# Patient Record
Sex: Male | Born: 1965 | Race: Black or African American | Hispanic: No
Health system: Western US, Academic
[De-identification: ages and names within clinical notes are randomized; demographics above are authoritative.]

## PROBLEM LIST (undated history)

## (undated) DIAGNOSIS — J309 Allergic rhinitis, unspecified: Secondary | ICD-10-CM

## (undated) DIAGNOSIS — F88 Other disorders of psychological development: Secondary | ICD-10-CM

## (undated) DIAGNOSIS — E119 Type 2 diabetes mellitus without complications: Secondary | ICD-10-CM

## (undated) DIAGNOSIS — J4489 Other specified chronic obstructive pulmonary disease: Secondary | ICD-10-CM

## (undated) DIAGNOSIS — I10 Essential (primary) hypertension: Secondary | ICD-10-CM

## (undated) DIAGNOSIS — E669 Obesity, unspecified: Secondary | ICD-10-CM

## (undated) DIAGNOSIS — E756 Lipid storage disorder, unspecified: Secondary | ICD-10-CM

## (undated) DIAGNOSIS — J449 Chronic obstructive pulmonary disease, unspecified: Secondary | ICD-10-CM

## (undated) DIAGNOSIS — F2089 Other schizophrenia: Secondary | ICD-10-CM

## (undated) HISTORY — DX: Other specified chronic obstructive pulmonary disease: J44.89

## (undated) HISTORY — DX: Obesity, unspecified: E66.9

## (undated) HISTORY — DX: Chronic obstructive pulmonary disease, unspecified: J44.9

## (undated) HISTORY — DX: Allergic rhinitis, unspecified: J30.9

## (undated) HISTORY — DX: Lipid storage disorder, unspecified: E75.6

## (undated) HISTORY — DX: Other disorders of psychological development: F88

## (undated) HISTORY — DX: Type 2 diabetes mellitus without complications: E11.9

## (undated) HISTORY — DX: Essential (primary) hypertension: I10

## (undated) HISTORY — DX: Other schizophrenia: F20.89

## (undated) MED ORDER — SIMVASTATIN 10 MG OR TABS
ORAL_TABLET | ORAL | Status: AC
Start: 2013-12-30 — End: ?

## (undated) MED ORDER — VITAMIN D 25 MCG (1000 UT) OR TABS
ORAL_TABLET | ORAL | Status: AC
Start: 2019-01-04 — End: ?

## (undated) MED ORDER — VITAMIN D 25 MCG (1000 UT) OR TABS
ORAL_TABLET | ORAL | Status: AC
Start: 2019-01-06 — End: ?

## (undated) MED ORDER — AMLODIPINE BESYLATE 10 MG OR TABS
ORAL_TABLET | ORAL | Status: AC
Start: 2014-01-27 — End: ?

## (undated) MED ORDER — POTASSIUM CHLORIDE CRYS ER 10 MEQ OR TBCR
EXTENDED_RELEASE_TABLET | ORAL | Status: AC
Start: 2019-01-04 — End: ?

## (undated) MED ORDER — SIMVASTATIN 10 MG OR TABS
ORAL_TABLET | ORAL | Status: AC
Start: 2014-01-03 — End: ?

## (undated) MED ORDER — RANITIDINE HCL 150 MG OR TABS
ORAL_TABLET | ORAL | 5 refills | Status: AC
Start: 2018-06-19 — End: ?

---

## 1999-06-29 ENCOUNTER — Encounter (HOSPITAL_BASED_OUTPATIENT_CLINIC_OR_DEPARTMENT_OTHER): Payer: Self-pay | Admitting: Critical Care Medicine

## 1999-07-27 ENCOUNTER — Encounter (HOSPITAL_BASED_OUTPATIENT_CLINIC_OR_DEPARTMENT_OTHER): Payer: Self-pay | Admitting: Critical Care Medicine

## 1999-08-31 ENCOUNTER — Encounter (HOSPITAL_BASED_OUTPATIENT_CLINIC_OR_DEPARTMENT_OTHER): Payer: Self-pay | Admitting: Critical Care Medicine

## 1999-09-14 ENCOUNTER — Encounter (HOSPITAL_BASED_OUTPATIENT_CLINIC_OR_DEPARTMENT_OTHER): Payer: Self-pay | Admitting: Critical Care Medicine

## 1999-10-12 ENCOUNTER — Encounter (HOSPITAL_BASED_OUTPATIENT_CLINIC_OR_DEPARTMENT_OTHER): Payer: Self-pay | Admitting: Critical Care Medicine

## 1999-11-09 ENCOUNTER — Encounter (HOSPITAL_BASED_OUTPATIENT_CLINIC_OR_DEPARTMENT_OTHER): Payer: Self-pay | Admitting: Critical Care Medicine

## 1999-11-16 ENCOUNTER — Encounter (HOSPITAL_BASED_OUTPATIENT_CLINIC_OR_DEPARTMENT_OTHER): Payer: Self-pay | Admitting: Critical Care Medicine

## 1999-12-21 ENCOUNTER — Encounter (HOSPITAL_BASED_OUTPATIENT_CLINIC_OR_DEPARTMENT_OTHER): Payer: Self-pay | Admitting: Critical Care Medicine

## 2000-02-15 ENCOUNTER — Encounter (HOSPITAL_BASED_OUTPATIENT_CLINIC_OR_DEPARTMENT_OTHER): Payer: Self-pay | Admitting: Critical Care Medicine

## 2000-03-21 ENCOUNTER — Encounter (HOSPITAL_BASED_OUTPATIENT_CLINIC_OR_DEPARTMENT_OTHER): Payer: Self-pay | Admitting: Critical Care Medicine

## 2000-05-23 ENCOUNTER — Encounter (HOSPITAL_BASED_OUTPATIENT_CLINIC_OR_DEPARTMENT_OTHER): Payer: Self-pay | Admitting: Critical Care Medicine

## 2000-05-30 ENCOUNTER — Encounter (HOSPITAL_BASED_OUTPATIENT_CLINIC_OR_DEPARTMENT_OTHER): Payer: Self-pay | Admitting: Critical Care Medicine

## 2000-07-25 ENCOUNTER — Encounter (HOSPITAL_BASED_OUTPATIENT_CLINIC_OR_DEPARTMENT_OTHER): Payer: Self-pay | Admitting: Critical Care Medicine

## 2000-09-26 ENCOUNTER — Encounter (HOSPITAL_BASED_OUTPATIENT_CLINIC_OR_DEPARTMENT_OTHER): Payer: Self-pay | Admitting: Critical Care Medicine

## 2000-11-07 ENCOUNTER — Encounter (HOSPITAL_BASED_OUTPATIENT_CLINIC_OR_DEPARTMENT_OTHER): Payer: Medicare Other | Admitting: Critical Care Medicine

## 2001-01-09 ENCOUNTER — Encounter (HOSPITAL_BASED_OUTPATIENT_CLINIC_OR_DEPARTMENT_OTHER): Payer: Medicare Other | Admitting: Critical Care Medicine

## 2001-02-06 ENCOUNTER — Encounter (HOSPITAL_BASED_OUTPATIENT_CLINIC_OR_DEPARTMENT_OTHER): Payer: Self-pay | Admitting: Critical Care Medicine

## 2001-02-27 ENCOUNTER — Encounter (HOSPITAL_BASED_OUTPATIENT_CLINIC_OR_DEPARTMENT_OTHER): Payer: Self-pay | Admitting: Critical Care Medicine

## 2001-09-03 ENCOUNTER — Encounter (HOSPITAL_BASED_OUTPATIENT_CLINIC_OR_DEPARTMENT_OTHER): Payer: Self-pay | Admitting: Family Medicine

## 2001-10-16 ENCOUNTER — Encounter (HOSPITAL_BASED_OUTPATIENT_CLINIC_OR_DEPARTMENT_OTHER): Payer: Medicare Other | Admitting: Critical Care Medicine

## 2001-11-05 ENCOUNTER — Encounter (HOSPITAL_BASED_OUTPATIENT_CLINIC_OR_DEPARTMENT_OTHER): Payer: Self-pay | Admitting: Family Medicine

## 2001-11-12 ENCOUNTER — Encounter (HOSPITAL_BASED_OUTPATIENT_CLINIC_OR_DEPARTMENT_OTHER): Payer: Self-pay | Admitting: Family Medicine

## 2001-11-20 ENCOUNTER — Encounter (HOSPITAL_BASED_OUTPATIENT_CLINIC_OR_DEPARTMENT_OTHER): Payer: Self-pay | Admitting: Critical Care Medicine

## 2001-11-20 ENCOUNTER — Encounter (HOSPITAL_BASED_OUTPATIENT_CLINIC_OR_DEPARTMENT_OTHER): Payer: Medicare Other | Admitting: Critical Care Medicine

## 2001-12-22 ENCOUNTER — Encounter (HOSPITAL_BASED_OUTPATIENT_CLINIC_OR_DEPARTMENT_OTHER): Payer: Medicaid Other

## 2002-01-05 ENCOUNTER — Encounter (HOSPITAL_BASED_OUTPATIENT_CLINIC_OR_DEPARTMENT_OTHER): Payer: Medicaid Other

## 2002-01-08 ENCOUNTER — Encounter (HOSPITAL_BASED_OUTPATIENT_CLINIC_OR_DEPARTMENT_OTHER): Payer: Medicaid Other

## 2002-01-08 ENCOUNTER — Encounter (HOSPITAL_BASED_OUTPATIENT_CLINIC_OR_DEPARTMENT_OTHER): Payer: Medicaid Other | Admitting: Critical Care Medicine

## 2002-02-05 ENCOUNTER — Encounter (HOSPITAL_BASED_OUTPATIENT_CLINIC_OR_DEPARTMENT_OTHER): Payer: Medicaid Other | Admitting: Critical Care Medicine

## 2002-02-26 ENCOUNTER — Encounter (HOSPITAL_BASED_OUTPATIENT_CLINIC_OR_DEPARTMENT_OTHER): Payer: Self-pay | Admitting: Critical Care Medicine

## 2002-03-03 ENCOUNTER — Encounter (HOSPITAL_BASED_OUTPATIENT_CLINIC_OR_DEPARTMENT_OTHER): Payer: Medicaid Other | Admitting: Internal Medicine

## 2002-03-12 ENCOUNTER — Encounter (HOSPITAL_BASED_OUTPATIENT_CLINIC_OR_DEPARTMENT_OTHER): Payer: Medicaid Other | Admitting: Critical Care Medicine

## 2002-04-16 ENCOUNTER — Encounter (HOSPITAL_BASED_OUTPATIENT_CLINIC_OR_DEPARTMENT_OTHER): Payer: Self-pay | Admitting: Critical Care Medicine

## 2002-04-27 ENCOUNTER — Encounter (HOSPITAL_BASED_OUTPATIENT_CLINIC_OR_DEPARTMENT_OTHER): Payer: Medicaid Other

## 2002-05-04 ENCOUNTER — Encounter (HOSPITAL_BASED_OUTPATIENT_CLINIC_OR_DEPARTMENT_OTHER): Payer: Self-pay | Admitting: Critical Care Medicine

## 2002-05-21 ENCOUNTER — Encounter (HOSPITAL_BASED_OUTPATIENT_CLINIC_OR_DEPARTMENT_OTHER): Payer: Medicaid Other | Admitting: Critical Care Medicine

## 2002-06-03 ENCOUNTER — Encounter (HOSPITAL_BASED_OUTPATIENT_CLINIC_OR_DEPARTMENT_OTHER): Payer: Medicaid Other | Admitting: Internal Medicine

## 2002-06-18 ENCOUNTER — Encounter (HOSPITAL_BASED_OUTPATIENT_CLINIC_OR_DEPARTMENT_OTHER): Payer: Self-pay | Admitting: Critical Care Medicine

## 2002-06-25 ENCOUNTER — Encounter (HOSPITAL_BASED_OUTPATIENT_CLINIC_OR_DEPARTMENT_OTHER): Payer: Medicaid Other | Admitting: Critical Care Medicine

## 2002-07-30 ENCOUNTER — Encounter (HOSPITAL_BASED_OUTPATIENT_CLINIC_OR_DEPARTMENT_OTHER): Payer: Self-pay

## 2002-09-03 ENCOUNTER — Encounter (HOSPITAL_BASED_OUTPATIENT_CLINIC_OR_DEPARTMENT_OTHER): Payer: Self-pay | Admitting: Critical Care Medicine

## 2002-09-07 ENCOUNTER — Encounter (HOSPITAL_BASED_OUTPATIENT_CLINIC_OR_DEPARTMENT_OTHER): Payer: Medicaid Other

## 2002-09-14 ENCOUNTER — Encounter (HOSPITAL_BASED_OUTPATIENT_CLINIC_OR_DEPARTMENT_OTHER): Payer: Medicaid Other

## 2002-10-12 ENCOUNTER — Encounter (HOSPITAL_BASED_OUTPATIENT_CLINIC_OR_DEPARTMENT_OTHER): Payer: Self-pay

## 2002-11-16 ENCOUNTER — Encounter (HOSPITAL_BASED_OUTPATIENT_CLINIC_OR_DEPARTMENT_OTHER): Payer: Medicaid Other | Admitting: Internal Medicine

## 2002-11-25 ENCOUNTER — Encounter (HOSPITAL_BASED_OUTPATIENT_CLINIC_OR_DEPARTMENT_OTHER): Payer: Self-pay

## 2002-12-01 ENCOUNTER — Encounter (HOSPITAL_BASED_OUTPATIENT_CLINIC_OR_DEPARTMENT_OTHER): Payer: Medicaid Other

## 2003-03-09 ENCOUNTER — Encounter (HOSPITAL_BASED_OUTPATIENT_CLINIC_OR_DEPARTMENT_OTHER): Payer: Self-pay | Admitting: Internal Medicine

## 2004-02-10 ENCOUNTER — Encounter (HOSPITAL_BASED_OUTPATIENT_CLINIC_OR_DEPARTMENT_OTHER): Payer: Medicaid Other | Admitting: Critical Care Medicine

## 2004-03-05 ENCOUNTER — Encounter (HOSPITAL_BASED_OUTPATIENT_CLINIC_OR_DEPARTMENT_OTHER): Payer: Medicaid Other | Admitting: Internal Medicine

## 2004-03-23 ENCOUNTER — Encounter (HOSPITAL_BASED_OUTPATIENT_CLINIC_OR_DEPARTMENT_OTHER): Payer: Medicaid Other | Admitting: Critical Care Medicine

## 2004-04-27 ENCOUNTER — Encounter (HOSPITAL_BASED_OUTPATIENT_CLINIC_OR_DEPARTMENT_OTHER): Payer: Medicaid Other | Admitting: Critical Care Medicine

## 2004-05-11 ENCOUNTER — Encounter (HOSPITAL_BASED_OUTPATIENT_CLINIC_OR_DEPARTMENT_OTHER): Payer: Medicaid Other | Admitting: Critical Care Medicine

## 2004-05-22 ENCOUNTER — Encounter (HOSPITAL_BASED_OUTPATIENT_CLINIC_OR_DEPARTMENT_OTHER): Payer: Medicaid Other

## 2004-06-08 ENCOUNTER — Encounter (HOSPITAL_BASED_OUTPATIENT_CLINIC_OR_DEPARTMENT_OTHER): Payer: Medicaid Other | Admitting: Critical Care Medicine

## 2004-06-22 ENCOUNTER — Encounter (HOSPITAL_BASED_OUTPATIENT_CLINIC_OR_DEPARTMENT_OTHER): Payer: Medicaid Other

## 2004-07-06 ENCOUNTER — Encounter (HOSPITAL_BASED_OUTPATIENT_CLINIC_OR_DEPARTMENT_OTHER): Payer: Medicaid Other | Admitting: Critical Care Medicine

## 2004-07-13 ENCOUNTER — Encounter (HOSPITAL_BASED_OUTPATIENT_CLINIC_OR_DEPARTMENT_OTHER): Payer: Medicaid Other

## 2004-07-30 ENCOUNTER — Encounter (HOSPITAL_BASED_OUTPATIENT_CLINIC_OR_DEPARTMENT_OTHER): Payer: Medicaid Other | Admitting: Internal Medicine

## 2004-07-31 ENCOUNTER — Encounter (HOSPITAL_BASED_OUTPATIENT_CLINIC_OR_DEPARTMENT_OTHER): Payer: Medicaid Other

## 2004-08-10 ENCOUNTER — Encounter (HOSPITAL_BASED_OUTPATIENT_CLINIC_OR_DEPARTMENT_OTHER): Payer: Medicaid Other | Admitting: Critical Care Medicine

## 2004-08-21 ENCOUNTER — Encounter (HOSPITAL_BASED_OUTPATIENT_CLINIC_OR_DEPARTMENT_OTHER): Payer: Medicaid Other

## 2004-09-07 ENCOUNTER — Encounter (HOSPITAL_BASED_OUTPATIENT_CLINIC_OR_DEPARTMENT_OTHER): Payer: Medicaid Other | Admitting: Critical Care Medicine

## 2004-09-10 ENCOUNTER — Encounter (HOSPITAL_BASED_OUTPATIENT_CLINIC_OR_DEPARTMENT_OTHER): Payer: Medicaid Other | Admitting: Internal Medicine

## 2004-09-21 ENCOUNTER — Encounter (HOSPITAL_BASED_OUTPATIENT_CLINIC_OR_DEPARTMENT_OTHER): Payer: Medicaid Other

## 2004-10-01 ENCOUNTER — Encounter (HOSPITAL_BASED_OUTPATIENT_CLINIC_OR_DEPARTMENT_OTHER): Payer: Medicaid Other | Admitting: Internal Medicine

## 2004-10-05 ENCOUNTER — Encounter (HOSPITAL_BASED_OUTPATIENT_CLINIC_OR_DEPARTMENT_OTHER): Payer: Medicaid Other | Admitting: Critical Care Medicine

## 2004-10-12 ENCOUNTER — Encounter (HOSPITAL_BASED_OUTPATIENT_CLINIC_OR_DEPARTMENT_OTHER): Payer: Medicaid Other | Admitting: Critical Care Medicine

## 2004-10-19 ENCOUNTER — Encounter (HOSPITAL_BASED_OUTPATIENT_CLINIC_OR_DEPARTMENT_OTHER): Payer: Medicaid Other

## 2004-10-22 ENCOUNTER — Encounter (HOSPITAL_BASED_OUTPATIENT_CLINIC_OR_DEPARTMENT_OTHER): Payer: Medicaid Other | Admitting: Internal Medicine

## 2004-11-02 ENCOUNTER — Encounter (HOSPITAL_BASED_OUTPATIENT_CLINIC_OR_DEPARTMENT_OTHER): Payer: Self-pay | Admitting: Critical Care Medicine

## 2004-11-16 ENCOUNTER — Encounter (HOSPITAL_BASED_OUTPATIENT_CLINIC_OR_DEPARTMENT_OTHER): Payer: Medicaid Other | Admitting: Critical Care Medicine

## 2004-11-20 ENCOUNTER — Encounter (HOSPITAL_BASED_OUTPATIENT_CLINIC_OR_DEPARTMENT_OTHER): Payer: Medicaid Other | Admitting: Internal Medicine

## 2004-11-30 ENCOUNTER — Encounter (HOSPITAL_BASED_OUTPATIENT_CLINIC_OR_DEPARTMENT_OTHER): Payer: Medicaid Other | Admitting: Critical Care Medicine

## 2005-01-04 ENCOUNTER — Encounter (HOSPITAL_BASED_OUTPATIENT_CLINIC_OR_DEPARTMENT_OTHER): Payer: Medicaid Other | Admitting: Critical Care Medicine

## 2005-01-11 ENCOUNTER — Encounter (HOSPITAL_BASED_OUTPATIENT_CLINIC_OR_DEPARTMENT_OTHER): Payer: Medicaid Other

## 2005-01-30 ENCOUNTER — Encounter (HOSPITAL_BASED_OUTPATIENT_CLINIC_OR_DEPARTMENT_OTHER): Payer: Medicaid Other | Admitting: Internal Medicine

## 2005-03-08 ENCOUNTER — Encounter (HOSPITAL_BASED_OUTPATIENT_CLINIC_OR_DEPARTMENT_OTHER): Payer: Medicaid Other | Admitting: Critical Care Medicine

## 2005-03-08 ENCOUNTER — Encounter (HOSPITAL_BASED_OUTPATIENT_CLINIC_OR_DEPARTMENT_OTHER): Payer: Medicaid Other

## 2005-03-15 ENCOUNTER — Encounter (HOSPITAL_BASED_OUTPATIENT_CLINIC_OR_DEPARTMENT_OTHER): Payer: Medicaid Other | Admitting: Critical Care Medicine

## 2005-03-22 ENCOUNTER — Encounter (HOSPITAL_BASED_OUTPATIENT_CLINIC_OR_DEPARTMENT_OTHER): Payer: Medicaid Other | Admitting: Critical Care Medicine

## 2005-03-26 ENCOUNTER — Encounter (HOSPITAL_BASED_OUTPATIENT_CLINIC_OR_DEPARTMENT_OTHER): Payer: Medicaid Other | Admitting: Critical Care Medicine

## 2005-04-26 ENCOUNTER — Encounter (HOSPITAL_BASED_OUTPATIENT_CLINIC_OR_DEPARTMENT_OTHER): Payer: Medicaid Other | Admitting: Critical Care Medicine

## 2005-05-03 ENCOUNTER — Encounter (HOSPITAL_BASED_OUTPATIENT_CLINIC_OR_DEPARTMENT_OTHER): Payer: Medicaid Other | Admitting: Critical Care Medicine

## 2005-05-13 ENCOUNTER — Encounter (HOSPITAL_BASED_OUTPATIENT_CLINIC_OR_DEPARTMENT_OTHER): Payer: Medicaid Other | Admitting: Internal Medicine

## 2005-05-31 ENCOUNTER — Encounter (HOSPITAL_BASED_OUTPATIENT_CLINIC_OR_DEPARTMENT_OTHER): Payer: Medicaid Other

## 2005-06-07 ENCOUNTER — Encounter (HOSPITAL_BASED_OUTPATIENT_CLINIC_OR_DEPARTMENT_OTHER): Payer: Medicaid Other | Admitting: Critical Care Medicine

## 2005-07-02 ENCOUNTER — Encounter (HOSPITAL_BASED_OUTPATIENT_CLINIC_OR_DEPARTMENT_OTHER): Payer: Medicaid Other

## 2005-08-06 ENCOUNTER — Encounter (HOSPITAL_BASED_OUTPATIENT_CLINIC_OR_DEPARTMENT_OTHER): Payer: Medicaid Other | Admitting: Pediatric Dermatology

## 2005-08-08 ENCOUNTER — Encounter (HOSPITAL_BASED_OUTPATIENT_CLINIC_OR_DEPARTMENT_OTHER): Payer: Medicaid Other

## 2005-08-09 ENCOUNTER — Encounter (HOSPITAL_BASED_OUTPATIENT_CLINIC_OR_DEPARTMENT_OTHER): Payer: Medicaid Other | Admitting: Critical Care Medicine

## 2005-09-10 ENCOUNTER — Encounter (HOSPITAL_BASED_OUTPATIENT_CLINIC_OR_DEPARTMENT_OTHER): Payer: Medicaid Other

## 2005-09-27 ENCOUNTER — Encounter (HOSPITAL_BASED_OUTPATIENT_CLINIC_OR_DEPARTMENT_OTHER): Payer: Medicaid Other | Admitting: Internal Medicine

## 2005-09-27 ENCOUNTER — Encounter (HOSPITAL_BASED_OUTPATIENT_CLINIC_OR_DEPARTMENT_OTHER): Payer: Medicaid Other

## 2005-10-11 ENCOUNTER — Encounter (HOSPITAL_BASED_OUTPATIENT_CLINIC_OR_DEPARTMENT_OTHER): Payer: Medicaid Other | Admitting: Critical Care Medicine

## 2005-11-08 ENCOUNTER — Encounter (HOSPITAL_BASED_OUTPATIENT_CLINIC_OR_DEPARTMENT_OTHER): Payer: Medicaid Other

## 2005-12-06 ENCOUNTER — Encounter (HOSPITAL_BASED_OUTPATIENT_CLINIC_OR_DEPARTMENT_OTHER): Payer: Medicaid Other

## 2005-12-13 ENCOUNTER — Encounter (HOSPITAL_BASED_OUTPATIENT_CLINIC_OR_DEPARTMENT_OTHER): Payer: Medicaid Other | Admitting: Internal Medicine

## 2005-12-20 ENCOUNTER — Encounter (HOSPITAL_BASED_OUTPATIENT_CLINIC_OR_DEPARTMENT_OTHER): Payer: Medicaid Other | Admitting: Critical Care Medicine

## 2005-12-27 ENCOUNTER — Encounter (HOSPITAL_BASED_OUTPATIENT_CLINIC_OR_DEPARTMENT_OTHER): Payer: Medicaid Other | Admitting: Critical Care Medicine

## 2006-01-03 ENCOUNTER — Encounter (HOSPITAL_BASED_OUTPATIENT_CLINIC_OR_DEPARTMENT_OTHER): Payer: Medicaid Other

## 2006-01-29 ENCOUNTER — Encounter (HOSPITAL_BASED_OUTPATIENT_CLINIC_OR_DEPARTMENT_OTHER): Payer: Medicaid Other | Admitting: Internal Medicine

## 2006-02-11 ENCOUNTER — Encounter (HOSPITAL_BASED_OUTPATIENT_CLINIC_OR_DEPARTMENT_OTHER): Payer: Medicaid Other | Admitting: Critical Care Medicine

## 2006-03-04 ENCOUNTER — Encounter (HOSPITAL_BASED_OUTPATIENT_CLINIC_OR_DEPARTMENT_OTHER): Payer: Medicaid Other

## 2006-03-25 ENCOUNTER — Encounter (HOSPITAL_BASED_OUTPATIENT_CLINIC_OR_DEPARTMENT_OTHER): Payer: Medicaid Other | Admitting: Critical Care Medicine

## 2006-03-28 ENCOUNTER — Encounter (HOSPITAL_BASED_OUTPATIENT_CLINIC_OR_DEPARTMENT_OTHER): Payer: Medicaid Other

## 2006-04-29 ENCOUNTER — Encounter (HOSPITAL_BASED_OUTPATIENT_CLINIC_OR_DEPARTMENT_OTHER): Payer: Medicaid Other | Admitting: Critical Care Medicine

## 2006-05-16 ENCOUNTER — Encounter (HOSPITAL_BASED_OUTPATIENT_CLINIC_OR_DEPARTMENT_OTHER): Payer: Medicaid Other | Admitting: Internal Medicine

## 2006-05-30 ENCOUNTER — Encounter (HOSPITAL_BASED_OUTPATIENT_CLINIC_OR_DEPARTMENT_OTHER): Payer: Medicaid Other | Admitting: Internal Medicine

## 2006-07-28 ENCOUNTER — Encounter (HOSPITAL_BASED_OUTPATIENT_CLINIC_OR_DEPARTMENT_OTHER): Payer: Medicaid Other | Admitting: Internal Medicine

## 2006-11-20 ENCOUNTER — Encounter (HOSPITAL_BASED_OUTPATIENT_CLINIC_OR_DEPARTMENT_OTHER): Payer: Medicaid Other | Admitting: Internal Medicine

## 2006-11-21 ENCOUNTER — Encounter (HOSPITAL_BASED_OUTPATIENT_CLINIC_OR_DEPARTMENT_OTHER): Payer: Medicaid Other | Admitting: Internal Medicine

## 2006-12-11 ENCOUNTER — Encounter (HOSPITAL_BASED_OUTPATIENT_CLINIC_OR_DEPARTMENT_OTHER): Payer: Medicaid Other | Admitting: Internal Medicine

## 2007-01-22 ENCOUNTER — Encounter (HOSPITAL_BASED_OUTPATIENT_CLINIC_OR_DEPARTMENT_OTHER): Payer: Medicaid Other | Admitting: Internal Medicine

## 2007-06-15 ENCOUNTER — Encounter (HOSPITAL_BASED_OUTPATIENT_CLINIC_OR_DEPARTMENT_OTHER): Payer: Self-pay | Admitting: Internal Medicine

## 2007-08-11 ENCOUNTER — Encounter (HOSPITAL_BASED_OUTPATIENT_CLINIC_OR_DEPARTMENT_OTHER): Payer: Medicaid Other | Admitting: Nurse Practitioner

## 2007-08-21 ENCOUNTER — Encounter (HOSPITAL_BASED_OUTPATIENT_CLINIC_OR_DEPARTMENT_OTHER): Payer: Medicaid Other | Admitting: Critical Care Medicine

## 2007-09-01 ENCOUNTER — Encounter (HOSPITAL_BASED_OUTPATIENT_CLINIC_OR_DEPARTMENT_OTHER): Payer: Self-pay | Admitting: Critical Care Medicine

## 2007-09-08 ENCOUNTER — Encounter (HOSPITAL_BASED_OUTPATIENT_CLINIC_OR_DEPARTMENT_OTHER): Payer: Medicaid Other | Admitting: Pulmonary Disease

## 2007-09-10 ENCOUNTER — Encounter (HOSPITAL_BASED_OUTPATIENT_CLINIC_OR_DEPARTMENT_OTHER): Payer: Medicaid Other | Admitting: Internal Medicine

## 2007-10-06 ENCOUNTER — Encounter (HOSPITAL_BASED_OUTPATIENT_CLINIC_OR_DEPARTMENT_OTHER): Payer: Medicaid Other | Admitting: Pulmonary Disease

## 2007-10-26 ENCOUNTER — Encounter (HOSPITAL_BASED_OUTPATIENT_CLINIC_OR_DEPARTMENT_OTHER): Payer: Medicaid Other | Admitting: Internal Medicine

## 2007-10-27 ENCOUNTER — Encounter (HOSPITAL_BASED_OUTPATIENT_CLINIC_OR_DEPARTMENT_OTHER): Payer: Medicaid Other | Admitting: Internal Medicine

## 2007-11-03 ENCOUNTER — Encounter (HOSPITAL_BASED_OUTPATIENT_CLINIC_OR_DEPARTMENT_OTHER): Payer: Medicaid Other | Admitting: Internal Medicine

## 2007-11-12 ENCOUNTER — Encounter (HOSPITAL_BASED_OUTPATIENT_CLINIC_OR_DEPARTMENT_OTHER): Payer: Medicaid Other | Admitting: Internal Medicine

## 2007-12-15 ENCOUNTER — Encounter (HOSPITAL_BASED_OUTPATIENT_CLINIC_OR_DEPARTMENT_OTHER): Payer: Medicaid Other | Admitting: Internal Medicine

## 2007-12-28 ENCOUNTER — Encounter (HOSPITAL_BASED_OUTPATIENT_CLINIC_OR_DEPARTMENT_OTHER): Payer: Medicaid Other | Admitting: Nurse Practitioner

## 2008-02-04 ENCOUNTER — Encounter (HOSPITAL_BASED_OUTPATIENT_CLINIC_OR_DEPARTMENT_OTHER): Payer: Medicaid Other | Admitting: Internal Medicine

## 2008-02-09 ENCOUNTER — Encounter (HOSPITAL_BASED_OUTPATIENT_CLINIC_OR_DEPARTMENT_OTHER): Payer: Medicaid Other | Admitting: Internal Medicine

## 2008-02-12 ENCOUNTER — Encounter (HOSPITAL_BASED_OUTPATIENT_CLINIC_OR_DEPARTMENT_OTHER): Payer: Self-pay | Admitting: Internal Medicine

## 2008-02-16 ENCOUNTER — Encounter (HOSPITAL_BASED_OUTPATIENT_CLINIC_OR_DEPARTMENT_OTHER): Payer: Medicaid Other | Admitting: Internal Medicine

## 2008-02-23 ENCOUNTER — Encounter (HOSPITAL_BASED_OUTPATIENT_CLINIC_OR_DEPARTMENT_OTHER): Payer: Medicaid Other

## 2008-03-15 ENCOUNTER — Encounter (HOSPITAL_BASED_OUTPATIENT_CLINIC_OR_DEPARTMENT_OTHER): Payer: Self-pay | Admitting: Internal Medicine

## 2008-03-15 ENCOUNTER — Encounter (HOSPITAL_BASED_OUTPATIENT_CLINIC_OR_DEPARTMENT_OTHER): Payer: Medicaid Other | Admitting: Internal Medicine

## 2008-03-22 ENCOUNTER — Encounter (HOSPITAL_BASED_OUTPATIENT_CLINIC_OR_DEPARTMENT_OTHER): Payer: Medicaid Other | Admitting: Internal Medicine

## 2008-03-24 ENCOUNTER — Encounter (HOSPITAL_BASED_OUTPATIENT_CLINIC_OR_DEPARTMENT_OTHER): Payer: Medicaid Other | Admitting: Registered"

## 2008-03-29 ENCOUNTER — Encounter (HOSPITAL_BASED_OUTPATIENT_CLINIC_OR_DEPARTMENT_OTHER): Payer: Medicaid Other | Admitting: Registered"

## 2008-04-12 ENCOUNTER — Encounter (HOSPITAL_BASED_OUTPATIENT_CLINIC_OR_DEPARTMENT_OTHER): Payer: Medicaid Other | Admitting: Registered"

## 2008-04-26 ENCOUNTER — Encounter (HOSPITAL_BASED_OUTPATIENT_CLINIC_OR_DEPARTMENT_OTHER): Payer: Medicaid Other | Admitting: Internal Medicine

## 2008-05-17 ENCOUNTER — Encounter (HOSPITAL_BASED_OUTPATIENT_CLINIC_OR_DEPARTMENT_OTHER): Payer: Medicaid Other | Admitting: Internal Medicine

## 2008-05-17 ENCOUNTER — Encounter (HOSPITAL_BASED_OUTPATIENT_CLINIC_OR_DEPARTMENT_OTHER): Payer: Medicaid Other | Admitting: Registered"

## 2008-06-07 ENCOUNTER — Encounter (HOSPITAL_BASED_OUTPATIENT_CLINIC_OR_DEPARTMENT_OTHER): Payer: Medicaid Other | Admitting: Internal Medicine

## 2008-06-21 ENCOUNTER — Encounter (HOSPITAL_BASED_OUTPATIENT_CLINIC_OR_DEPARTMENT_OTHER): Payer: Medicaid Other | Admitting: Internal Medicine

## 2008-08-08 ENCOUNTER — Encounter (HOSPITAL_BASED_OUTPATIENT_CLINIC_OR_DEPARTMENT_OTHER): Payer: Medicaid Other | Admitting: Registered"

## 2008-08-08 ENCOUNTER — Encounter (HOSPITAL_BASED_OUTPATIENT_CLINIC_OR_DEPARTMENT_OTHER): Payer: Medicaid Other | Admitting: Internal Medicine

## 2008-08-09 ENCOUNTER — Encounter (HOSPITAL_BASED_OUTPATIENT_CLINIC_OR_DEPARTMENT_OTHER): Payer: Medicaid Other | Admitting: Internal Medicine

## 2008-08-12 ENCOUNTER — Encounter (HOSPITAL_BASED_OUTPATIENT_CLINIC_OR_DEPARTMENT_OTHER): Payer: Medicaid Other | Admitting: Registered"

## 2008-08-16 ENCOUNTER — Encounter (HOSPITAL_BASED_OUTPATIENT_CLINIC_OR_DEPARTMENT_OTHER): Payer: Self-pay | Admitting: Internal Medicine

## 2008-08-26 ENCOUNTER — Encounter (HOSPITAL_BASED_OUTPATIENT_CLINIC_OR_DEPARTMENT_OTHER): Payer: Medicaid Other | Admitting: Internal Medicine

## 2008-09-06 ENCOUNTER — Encounter (HOSPITAL_BASED_OUTPATIENT_CLINIC_OR_DEPARTMENT_OTHER): Payer: Medicaid Other | Admitting: Internal Medicine

## 2008-09-12 ENCOUNTER — Encounter (HOSPITAL_BASED_OUTPATIENT_CLINIC_OR_DEPARTMENT_OTHER): Payer: Medicaid Other | Admitting: Registered"

## 2008-09-20 ENCOUNTER — Encounter (HOSPITAL_BASED_OUTPATIENT_CLINIC_OR_DEPARTMENT_OTHER): Payer: Medicaid Other | Admitting: Internal Medicine

## 2008-10-03 ENCOUNTER — Encounter (HOSPITAL_BASED_OUTPATIENT_CLINIC_OR_DEPARTMENT_OTHER): Payer: Medicaid Other | Admitting: Registered"

## 2008-10-31 ENCOUNTER — Encounter (HOSPITAL_BASED_OUTPATIENT_CLINIC_OR_DEPARTMENT_OTHER): Payer: Medicaid Other | Admitting: Registered"

## 2008-11-28 ENCOUNTER — Ambulatory Visit (HOSPITAL_BASED_OUTPATIENT_CLINIC_OR_DEPARTMENT_OTHER): Payer: Medicaid Other | Admitting: Registered"

## 2008-12-23 ENCOUNTER — Ambulatory Visit (HOSPITAL_BASED_OUTPATIENT_CLINIC_OR_DEPARTMENT_OTHER): Payer: Medicaid Other | Attending: Critical Care Medicine | Admitting: Internal Medicine

## 2008-12-23 ENCOUNTER — Ambulatory Visit (HOSPITAL_BASED_OUTPATIENT_CLINIC_OR_DEPARTMENT_OTHER): Payer: Medicaid Other

## 2008-12-23 DIAGNOSIS — J309 Allergic rhinitis, unspecified: Secondary | ICD-10-CM | POA: Insufficient documentation

## 2008-12-23 DIAGNOSIS — K219 Gastro-esophageal reflux disease without esophagitis: Secondary | ICD-10-CM | POA: Insufficient documentation

## 2008-12-28 ENCOUNTER — Ambulatory Visit (HOSPITAL_BASED_OUTPATIENT_CLINIC_OR_DEPARTMENT_OTHER): Payer: Medicaid Other | Attending: Internal Medicine | Admitting: Internal Medicine

## 2008-12-28 DIAGNOSIS — I10 Essential (primary) hypertension: Secondary | ICD-10-CM | POA: Insufficient documentation

## 2008-12-28 DIAGNOSIS — E785 Hyperlipidemia, unspecified: Secondary | ICD-10-CM | POA: Insufficient documentation

## 2008-12-28 DIAGNOSIS — E669 Obesity, unspecified: Secondary | ICD-10-CM | POA: Insufficient documentation

## 2008-12-30 ENCOUNTER — Ambulatory Visit (HOSPITAL_BASED_OUTPATIENT_CLINIC_OR_DEPARTMENT_OTHER): Payer: Medicaid Other | Admitting: Registered"

## 2009-01-30 ENCOUNTER — Ambulatory Visit (HOSPITAL_BASED_OUTPATIENT_CLINIC_OR_DEPARTMENT_OTHER): Payer: Medicaid Other | Admitting: Registered"

## 2009-02-24 ENCOUNTER — Ambulatory Visit (HOSPITAL_BASED_OUTPATIENT_CLINIC_OR_DEPARTMENT_OTHER): Payer: Medicaid Other | Admitting: Registered"

## 2009-02-27 ENCOUNTER — Ambulatory Visit (HOSPITAL_BASED_OUTPATIENT_CLINIC_OR_DEPARTMENT_OTHER): Payer: Medicaid Other | Attending: Internal Medicine | Admitting: Internal Medicine

## 2009-02-27 DIAGNOSIS — I10 Essential (primary) hypertension: Secondary | ICD-10-CM | POA: Insufficient documentation

## 2009-02-27 DIAGNOSIS — Z23 Encounter for immunization: Secondary | ICD-10-CM | POA: Insufficient documentation

## 2009-02-27 DIAGNOSIS — E669 Obesity, unspecified: Secondary | ICD-10-CM | POA: Insufficient documentation

## 2009-02-27 DIAGNOSIS — E785 Hyperlipidemia, unspecified: Secondary | ICD-10-CM | POA: Insufficient documentation

## 2009-03-24 ENCOUNTER — Ambulatory Visit (HOSPITAL_BASED_OUTPATIENT_CLINIC_OR_DEPARTMENT_OTHER): Payer: Medicaid Other | Admitting: Registered"

## 2009-04-28 ENCOUNTER — Ambulatory Visit (HOSPITAL_BASED_OUTPATIENT_CLINIC_OR_DEPARTMENT_OTHER): Payer: Medicaid Other | Admitting: Registered"

## 2009-04-28 ENCOUNTER — Ambulatory Visit (HOSPITAL_BASED_OUTPATIENT_CLINIC_OR_DEPARTMENT_OTHER): Payer: Medicaid Other | Attending: Critical Care Medicine | Admitting: Internal Medicine

## 2009-04-28 DIAGNOSIS — J309 Allergic rhinitis, unspecified: Secondary | ICD-10-CM | POA: Insufficient documentation

## 2009-04-28 DIAGNOSIS — K219 Gastro-esophageal reflux disease without esophagitis: Secondary | ICD-10-CM | POA: Insufficient documentation

## 2009-05-01 ENCOUNTER — Ambulatory Visit (HOSPITAL_BASED_OUTPATIENT_CLINIC_OR_DEPARTMENT_OTHER): Payer: Medicaid Other | Attending: Internal Medicine | Admitting: Internal Medicine

## 2009-05-01 DIAGNOSIS — E669 Obesity, unspecified: Secondary | ICD-10-CM | POA: Insufficient documentation

## 2009-05-01 DIAGNOSIS — I10 Essential (primary) hypertension: Secondary | ICD-10-CM | POA: Insufficient documentation

## 2009-05-01 DIAGNOSIS — E785 Hyperlipidemia, unspecified: Secondary | ICD-10-CM | POA: Insufficient documentation

## 2009-06-02 ENCOUNTER — Encounter (HOSPITAL_BASED_OUTPATIENT_CLINIC_OR_DEPARTMENT_OTHER): Payer: Medicaid Other | Admitting: Registered"

## 2009-06-09 ENCOUNTER — Encounter (HOSPITAL_BASED_OUTPATIENT_CLINIC_OR_DEPARTMENT_OTHER): Payer: Medicaid Other | Admitting: Registered"

## 2009-06-16 ENCOUNTER — Encounter (HOSPITAL_BASED_OUTPATIENT_CLINIC_OR_DEPARTMENT_OTHER): Payer: Medicaid Other | Admitting: Registered"

## 2009-06-19 ENCOUNTER — Ambulatory Visit (HOSPITAL_BASED_OUTPATIENT_CLINIC_OR_DEPARTMENT_OTHER): Payer: Medicaid Other | Admitting: Registered"

## 2009-07-28 ENCOUNTER — Encounter (HOSPITAL_BASED_OUTPATIENT_CLINIC_OR_DEPARTMENT_OTHER): Payer: Medicaid Other | Admitting: Internal Medicine

## 2009-07-31 ENCOUNTER — Ambulatory Visit (HOSPITAL_BASED_OUTPATIENT_CLINIC_OR_DEPARTMENT_OTHER): Payer: Medicaid Other | Attending: Internal Medicine | Admitting: Internal Medicine

## 2009-07-31 ENCOUNTER — Ambulatory Visit (HOSPITAL_BASED_OUTPATIENT_CLINIC_OR_DEPARTMENT_OTHER): Payer: Medicaid Other | Admitting: Registered"

## 2009-07-31 DIAGNOSIS — E785 Hyperlipidemia, unspecified: Secondary | ICD-10-CM | POA: Insufficient documentation

## 2009-07-31 DIAGNOSIS — I10 Essential (primary) hypertension: Secondary | ICD-10-CM | POA: Insufficient documentation

## 2009-07-31 DIAGNOSIS — E669 Obesity, unspecified: Secondary | ICD-10-CM | POA: Insufficient documentation

## 2009-08-11 ENCOUNTER — Encounter (HOSPITAL_BASED_OUTPATIENT_CLINIC_OR_DEPARTMENT_OTHER): Payer: Self-pay | Admitting: Internal Medicine

## 2009-08-18 ENCOUNTER — Ambulatory Visit (HOSPITAL_BASED_OUTPATIENT_CLINIC_OR_DEPARTMENT_OTHER): Payer: Medicaid Other | Attending: Critical Care Medicine | Admitting: Internal Medicine

## 2009-08-18 ENCOUNTER — Encounter (HOSPITAL_BASED_OUTPATIENT_CLINIC_OR_DEPARTMENT_OTHER): Payer: Medicaid Other | Admitting: Internal Medicine

## 2009-08-18 DIAGNOSIS — I10 Essential (primary) hypertension: Secondary | ICD-10-CM | POA: Insufficient documentation

## 2009-08-18 DIAGNOSIS — K219 Gastro-esophageal reflux disease without esophagitis: Secondary | ICD-10-CM | POA: Insufficient documentation

## 2009-08-18 DIAGNOSIS — J309 Allergic rhinitis, unspecified: Secondary | ICD-10-CM | POA: Insufficient documentation

## 2009-08-28 ENCOUNTER — Ambulatory Visit (HOSPITAL_BASED_OUTPATIENT_CLINIC_OR_DEPARTMENT_OTHER): Payer: Medicaid Other | Admitting: Registered"

## 2009-08-29 ENCOUNTER — Encounter (HOSPITAL_BASED_OUTPATIENT_CLINIC_OR_DEPARTMENT_OTHER): Payer: Medicaid Other | Admitting: Internal Medicine

## 2009-09-01 ENCOUNTER — Ambulatory Visit (HOSPITAL_BASED_OUTPATIENT_CLINIC_OR_DEPARTMENT_OTHER): Payer: Medicaid Other | Attending: Nurse Practitioner | Admitting: Nurse Practitioner

## 2009-09-01 DIAGNOSIS — I10 Essential (primary) hypertension: Secondary | ICD-10-CM | POA: Insufficient documentation

## 2009-09-01 DIAGNOSIS — F209 Schizophrenia, unspecified: Secondary | ICD-10-CM | POA: Insufficient documentation

## 2009-09-01 DIAGNOSIS — E785 Hyperlipidemia, unspecified: Secondary | ICD-10-CM | POA: Insufficient documentation

## 2009-09-29 ENCOUNTER — Ambulatory Visit (HOSPITAL_BASED_OUTPATIENT_CLINIC_OR_DEPARTMENT_OTHER): Payer: Medicaid Other | Admitting: Registered"

## 2009-10-10 ENCOUNTER — Encounter (HOSPITAL_BASED_OUTPATIENT_CLINIC_OR_DEPARTMENT_OTHER): Payer: Medicaid Other | Admitting: Internal Medicine

## 2009-10-20 ENCOUNTER — Ambulatory Visit (HOSPITAL_BASED_OUTPATIENT_CLINIC_OR_DEPARTMENT_OTHER): Payer: Medicaid Other | Attending: Internal Medicine | Admitting: Internal Medicine

## 2009-10-20 DIAGNOSIS — I10 Essential (primary) hypertension: Secondary | ICD-10-CM | POA: Insufficient documentation

## 2009-10-20 DIAGNOSIS — E669 Obesity, unspecified: Secondary | ICD-10-CM | POA: Insufficient documentation

## 2009-10-20 DIAGNOSIS — E785 Hyperlipidemia, unspecified: Secondary | ICD-10-CM | POA: Insufficient documentation

## 2009-10-20 DIAGNOSIS — Z23 Encounter for immunization: Secondary | ICD-10-CM | POA: Insufficient documentation

## 2009-11-02 ENCOUNTER — Encounter (HOSPITAL_BASED_OUTPATIENT_CLINIC_OR_DEPARTMENT_OTHER): Payer: Medicaid Other

## 2009-11-03 ENCOUNTER — Ambulatory Visit (HOSPITAL_BASED_OUTPATIENT_CLINIC_OR_DEPARTMENT_OTHER): Payer: Medicaid Other | Admitting: Registered"

## 2009-11-03 ENCOUNTER — Ambulatory Visit (HOSPITAL_BASED_OUTPATIENT_CLINIC_OR_DEPARTMENT_OTHER): Payer: Medicaid Other

## 2009-12-06 ENCOUNTER — Ambulatory Visit (HOSPITAL_BASED_OUTPATIENT_CLINIC_OR_DEPARTMENT_OTHER): Payer: Medicaid Other | Attending: Internal Medicine | Admitting: Internal Medicine

## 2009-12-06 DIAGNOSIS — E669 Obesity, unspecified: Secondary | ICD-10-CM | POA: Insufficient documentation

## 2009-12-06 DIAGNOSIS — E785 Hyperlipidemia, unspecified: Secondary | ICD-10-CM | POA: Insufficient documentation

## 2009-12-06 DIAGNOSIS — I10 Essential (primary) hypertension: Secondary | ICD-10-CM | POA: Insufficient documentation

## 2009-12-08 ENCOUNTER — Encounter (HOSPITAL_BASED_OUTPATIENT_CLINIC_OR_DEPARTMENT_OTHER): Payer: Medicaid Other | Admitting: Registered"

## 2009-12-08 ENCOUNTER — Ambulatory Visit (HOSPITAL_BASED_OUTPATIENT_CLINIC_OR_DEPARTMENT_OTHER): Payer: Medicaid Other

## 2009-12-19 ENCOUNTER — Encounter (HOSPITAL_BASED_OUTPATIENT_CLINIC_OR_DEPARTMENT_OTHER): Payer: Medicaid Other | Admitting: Critical Care Medicine

## 2009-12-19 ENCOUNTER — Encounter (HOSPITAL_BASED_OUTPATIENT_CLINIC_OR_DEPARTMENT_OTHER): Payer: Medicaid Other | Admitting: Internal Medicine

## 2009-12-22 ENCOUNTER — Ambulatory Visit (HOSPITAL_BASED_OUTPATIENT_CLINIC_OR_DEPARTMENT_OTHER): Payer: Medicaid Other | Admitting: Registered"

## 2009-12-26 ENCOUNTER — Ambulatory Visit (HOSPITAL_BASED_OUTPATIENT_CLINIC_OR_DEPARTMENT_OTHER): Payer: Medicaid Other | Attending: Critical Care Medicine | Admitting: Critical Care Medicine

## 2009-12-26 ENCOUNTER — Ambulatory Visit (HOSPITAL_BASED_OUTPATIENT_CLINIC_OR_DEPARTMENT_OTHER): Payer: Medicaid Other

## 2009-12-26 ENCOUNTER — Encounter (HOSPITAL_BASED_OUTPATIENT_CLINIC_OR_DEPARTMENT_OTHER): Payer: Medicaid Other | Admitting: Critical Care Medicine

## 2009-12-30 ENCOUNTER — Encounter (HOSPITAL_BASED_OUTPATIENT_CLINIC_OR_DEPARTMENT_OTHER): Payer: Medicaid Other

## 2010-01-02 ENCOUNTER — Encounter (HOSPITAL_BASED_OUTPATIENT_CLINIC_OR_DEPARTMENT_OTHER): Payer: Medicaid Other | Admitting: Internal Medicine

## 2010-01-10 ENCOUNTER — Ambulatory Visit (HOSPITAL_BASED_OUTPATIENT_CLINIC_OR_DEPARTMENT_OTHER): Payer: Medicaid Other | Attending: Internal Medicine | Admitting: Internal Medicine

## 2010-01-10 DIAGNOSIS — E669 Obesity, unspecified: Secondary | ICD-10-CM | POA: Insufficient documentation

## 2010-01-10 DIAGNOSIS — I10 Essential (primary) hypertension: Secondary | ICD-10-CM | POA: Insufficient documentation

## 2010-01-10 DIAGNOSIS — E785 Hyperlipidemia, unspecified: Secondary | ICD-10-CM | POA: Insufficient documentation

## 2010-01-19 ENCOUNTER — Encounter (HOSPITAL_BASED_OUTPATIENT_CLINIC_OR_DEPARTMENT_OTHER): Payer: Medicaid Other | Admitting: Registered"

## 2010-01-20 ENCOUNTER — Ambulatory Visit (HOSPITAL_BASED_OUTPATIENT_CLINIC_OR_DEPARTMENT_OTHER): Payer: Medicaid Other

## 2010-01-29 ENCOUNTER — Encounter (HOSPITAL_BASED_OUTPATIENT_CLINIC_OR_DEPARTMENT_OTHER): Payer: Medicaid Other | Admitting: Registered"

## 2010-02-02 ENCOUNTER — Encounter (HOSPITAL_BASED_OUTPATIENT_CLINIC_OR_DEPARTMENT_OTHER): Payer: Medicaid Other | Admitting: Registered"

## 2010-02-05 ENCOUNTER — Encounter (HOSPITAL_BASED_OUTPATIENT_CLINIC_OR_DEPARTMENT_OTHER): Payer: Medicaid Other | Admitting: Registered"

## 2010-02-26 ENCOUNTER — Ambulatory Visit (HOSPITAL_BASED_OUTPATIENT_CLINIC_OR_DEPARTMENT_OTHER): Payer: Medicaid Other | Admitting: Registered"

## 2010-02-26 VITALS — Ht 62.99 in | Wt 221.3 lb

## 2010-02-26 NOTE — Progress Notes (Signed)
Nutrition Note    Clinic:  Adult Medicine    Note Type: Reassessment    Referred by: Dr Kimberlee Nearing  Same day verbal order read back: No  Date referred: 03/2008    Reason For Visit: Weight management    An interpreter was not needed for the visit.    Assessment    Medical Diagnosis: Obesity, Hypertension, Hyperlipidemia, Esophogeal Reflux and Developmental Delay, Schizophrenia    Wt 221 lb 4.8 oz (100.381 kg)  Ht 5' 2.992" (1.6 m)  Body mass index is 39.21 kg/(m^2).    Weight History: 214lb (12/22/2009); Initial wt: 189.4lb (04/2008)    Adult BMI Classification: obese categ. II (36-40)      Labs: A1c 6.1 (02/27/09)    Self monitored blood glucose: No    No current outpatient prescriptions on file.   Ranitidine, Simvastatin, Risperdal    Vitamins/Minerals/Herbal Supplements: not discussed    Nutrition requirements: not discussed    Barriers to adequate intake: None    GI tolerance: None      History   Substance Use Topics   . Smoking status: Not on file   . Smokeless tobacco: Not on file   . Alcohol Use: Not on file         Activity: pt tries to walk to [facility] near his house and says he does this 2x/day and that it takes him 1 hour to complete 1 round trip.  He is also bowling on Saturdays    Special/alternative diet: None    Food intolerances/allergies: None    Diet History: Pt has care giver who is preparing some meals  B: raisin bran w/ 2% milk, water  L: pbj sandwich or leftovers  D: rice w/ beef and carrots and peas or spaghetti    Justin Zavala denies eating snack foods daily.  He has had some cookies but does not mention any others. He admits to having a cheeseburger after bowling last week- first time this month and also went out for breakfast w/ his case manager Justin Zavala.  He had pancakes and hashbrowns and is worried that is why he gained weight.  Justin Zavala is concerned about Thanksgiving and also requests more grocery lists and meal plans that I developed for him    Information from Patient: none    Assessment Summary:  7.5lb weight gain in past 2 months.      Nutrition Diagnosis: Inability to manage self care  related to develomental delay evidenced by difficulty sticking to recommendations.  Suspect he may not remember what we have discussed    Interventions: Reviewed meal ideas and printed him out more copies of both meal plans and grocery lists.  Explained it is ok for him to have 1-2 meals out per month but should eat most of his meals at home.  Suggested if he wants a cookie, he buy just one from the bakery instead of package of cookies  Provided him tips to help not overeat at Thanksgiving    Education Provided:  Please refer to Education section of chart       Patient stated goals: Weight loss and Pt would like to lose 30lb in next year and makes goal to lose 2lb by next month\    Nutrition topics taught: Weight Management::  Healthy choices and portion control    Education materials provided: YES: meal ideas (w/ portions); grocery list, tips for Thanksgiving       Time spent teaching: 15 mins      Plan/Recommendations  Follow up: In 1 month(s)    Plan: Use meal plans and grocery lists.  Buy measuring cups so can measure portions appropriately    Total Time Spent:  60 mins    Justin Che, MS, RD, CD, CDE  [facility]  [phone]  [address] [place], [place] [address]  Pager: [phone]

## 2010-03-07 ENCOUNTER — Encounter (HOSPITAL_BASED_OUTPATIENT_CLINIC_OR_DEPARTMENT_OTHER): Payer: Medicaid Other | Admitting: Internal Medicine

## 2010-03-20 ENCOUNTER — Encounter (HOSPITAL_BASED_OUTPATIENT_CLINIC_OR_DEPARTMENT_OTHER): Payer: Medicaid Other | Admitting: Internal Medicine

## 2010-03-26 ENCOUNTER — Encounter (HOSPITAL_BASED_OUTPATIENT_CLINIC_OR_DEPARTMENT_OTHER): Payer: Medicaid Other | Admitting: Registered"

## 2010-03-27 ENCOUNTER — Encounter (HOSPITAL_BASED_OUTPATIENT_CLINIC_OR_DEPARTMENT_OTHER): Payer: Medicaid Other | Admitting: Internal Medicine

## 2010-03-30 ENCOUNTER — Encounter (HOSPITAL_BASED_OUTPATIENT_CLINIC_OR_DEPARTMENT_OTHER): Payer: Medicaid Other | Admitting: Registered"

## 2010-04-06 ENCOUNTER — Encounter (HOSPITAL_BASED_OUTPATIENT_CLINIC_OR_DEPARTMENT_OTHER): Payer: Medicaid Other | Admitting: Internal Medicine

## 2010-04-16 ENCOUNTER — Other Ambulatory Visit (HOSPITAL_BASED_OUTPATIENT_CLINIC_OR_DEPARTMENT_OTHER): Payer: Self-pay | Admitting: Internal Medicine

## 2010-04-16 ENCOUNTER — Encounter (HOSPITAL_BASED_OUTPATIENT_CLINIC_OR_DEPARTMENT_OTHER): Payer: Self-pay | Admitting: Internal Medicine

## 2010-04-16 ENCOUNTER — Ambulatory Visit (HOSPITAL_BASED_OUTPATIENT_CLINIC_OR_DEPARTMENT_OTHER): Payer: Medicaid Other | Attending: Internal Medicine | Admitting: Internal Medicine

## 2010-04-16 ENCOUNTER — Ambulatory Visit (HOSPITAL_BASED_OUTPATIENT_CLINIC_OR_DEPARTMENT_OTHER): Payer: Medicaid Other | Admitting: Registered"

## 2010-04-16 VITALS — Ht 63.0 in | Wt 215.0 lb

## 2010-04-16 VITALS — BP 171/69 | HR 116 | Temp 97.2°F | Resp 16 | Ht 64.0 in | Wt 212.0 lb

## 2010-04-16 DIAGNOSIS — N399 Disorder of urinary system, unspecified: Secondary | ICD-10-CM | POA: Insufficient documentation

## 2010-04-16 DIAGNOSIS — R7989 Other specified abnormal findings of blood chemistry: Secondary | ICD-10-CM | POA: Insufficient documentation

## 2010-04-16 LAB — URINALYSIS COMPLETE, URN
Bacteria, URN: NONE SEEN
Bilirubin (Qual), URN: NEGATIVE
Epith Cells_Renal/Trans,URN: NEGATIVE /HPF
Epith Cells_Squamous, URN: NEGATIVE /LPF
Leukocyte Esterase, URN: NEGATIVE
Nitrite, URN: NEGATIVE
Occult Blood, URN: NEGATIVE
Protein (Alb Semiquant), URN: NEGATIVE mg/dL
RBC, URN: NEGATIVE /HPF
Specific Gravity, URN: 1.026 g/mL (ref 1.002–1.027)
WBC, URN: NEGATIVE /HPF
pH, URN: 6 (ref 5.0–8.0)

## 2010-04-16 LAB — BASIC METABOLIC PANEL
Anion Gap: 8 (ref 3–11)
Calcium: 9.7 mg/dL (ref 8.9–10.2)
Carbon Dioxide, Total: 30 mEq/L (ref 22–32)
Chloride: 99 mEq/L (ref 98–108)
Creatinine: 0.97 mg/dL (ref 0.51–1.18)
GFR, Calc, African American: >60 mL/min (ref >59)
GFR, Calc, European American: >60 mL/min (ref >59)
Glucose: 255 mg/dL — ABNORMAL HIGH (ref 62–125)
Potassium: 4 mEq/L (ref 3.7–5.2)
Sodium: 137 mEq/L (ref 136–145)
Urea Nitrogen: 7 mg/dL — ABNORMAL LOW (ref 8–21)

## 2010-04-16 NOTE — Progress Notes (Signed)
Nutrition Note    Clinic:  Adult Medicine    Note Type: Reassessment    Referred by: Dr Kimberlee Nearing  Same day verbal order read back: No  Date referred: 03/2008    Reason For Visit: Weight management    An interpreter was not needed for the visit.    Assessment    Medical Diagnosis: Obesity, Hypertension, Hyperlipidemia, Esophogeal Reflux and Developmental Delay, Schizophrenia    Wt 215 lb (97.523 kg)  Ht 5\' 3"  (1.6 m)  Body mass index is 38.09 kg/(m^2).    Weight History:   Vitals WEIGHT HEIGHT BODY MASS INDEX   02/26/2010 221 lb 4.8 oz 5' 2.992" 39.21     Initial wt: 189.4lb (04/2008)    Adult BMI Classification: obese categ. II (36-40)      Labs: A1c 6.1 (02/27/09)    Self monitored blood glucose: No    No current outpatient prescriptions on file.   Ranitidine, Simvastatin, Risperdal    Vitamins/Minerals/Herbal Supplements: not discussed    Nutrition requirements: not discussed    Barriers to adequate intake: None    GI tolerance: None      History   Substance Use Topics   . Smoking status: Not on file   . Smokeless tobacco: Not on file   . Alcohol Use: Not on file         Activity: Pt participating in bowling and basketball.  He is also walking     Special/alternative diet: None    Food intolerances/allergies: None    Diet History: Pt has care giver who is preparing some meals   He states that he still has grocery list we developed and he is using this regularly.  He states that he has stopped eating junk food which he qualifies as fast food. He is also paying attention to portions and states he plans to stop eating seconds.  Finally he is thinking about a plan for when he goes to a birthday party and hockey game later this month.     Information from Patient: none    Assessment Summary: 6lb weight loss in past 2 months.      Nutrition Diagnosis: Inability to manage self care  related to develomental delay evidenced by difficulty sticking to recommendations.    Diagnosis Reassessment:  _ Resolved (nutrition  problem no longer exists)  _ Improvement shown (Nutrition problem still exists).  Pt has lost weight and seems to be following recommendations from previous visits  _ Unresolved no improvement shown  _ No longer appropriate (change in condition)      Interventions: Reviewed meal ideas and gave him another copy of meal ideas at home and at restaurants.  Explained it is ok for him to have cake at the birthday party but to limit to 1 small piece    Education Provided:  Please refer to Education section of chart       Patient stated goals: Weight loss and Pt would like to lose 30lb in next year and makes goal to lose 2lb by next month\    Nutrition topics taught: Weight Management::  Healthy choices and portion control    Education materials provided: YES: restaurant and at home meal ideas (w/ portions)    Time spent teaching: 10 mins      Plan/Recommendations    Follow up: In 1 month(s)    Plan: Use meal plans and grocery lists.  Buy measuring cups so can measure portions appropriately    Total Time Spent:  34 mins    Ed Blalock, MS, RD, CD, CDE  [facility]  [place]  [address], [place] [address]  Pager: [phone]

## 2010-04-16 NOTE — Progress Notes (Signed)
VACCINE SCREENING/ORDER WHEN CONTRAINDICATIONS PRESENT    Order date: 04/16/2010  Ordering provider: Bettey Costa, MD  Clinic stock used: YES   Interpreter used?: None  Vaccine information sheet(s) discussed, patient/parent/guardian verbalized understanding? YES, 10/31/09  Vaccines given: Influenza     SCREENING: INACTIVATED VACCINES  NO 1. Do you have a high fever, severe cold-like symptoms or serious infection?  NO 2. Do you have any food allergies such as eggs, chicken, chicken feathers, chicken dander, or gelatin?  NO 3. Do you have any allergies to neomycin, streptomycin, or polymyxin?   NO 4. Have you had any severe reaction to a vaccine in the past such as a seizure, a convulsion from a high fever, or a paralysis problem called Guillain-Barre Syndrome? If so, which vaccine(s):     Patient tolerated it well.    If the patient/parent/or guardian answered "yes" to any of the above questions, consult with the provider to review the responses prior to administering vaccination. Parents with a contraindication to immunization will not receive the immunization without a specific physician order to vaccinate the patient and discussion of risk and benefits related to the contraindication.     Physician Order for Administration of Vaccine(s) When Contraindications Are Present  I have reviewed the existence in this patient's health history of possible contraindication(s) to administration of vaccine. The benefits to this patient outweigh the potential risks of immunization. Administer vaccine(s):  Dawna Part, 04/16/2010 3:25 PM

## 2010-04-16 NOTE — Progress Notes (Signed)
Adult Medicine Clinic - Outpatient Return Clinic Note    DATE: 04/16/2010     Justin Zavala is a 45 year old male who is here today for loss of fecal control and difficulty urinating. An interpreter was not needed for the visit.    PROBLEM LIST:  There are no active problems to display for this patient.    MEDICATIONS:  Current Outpatient Prescriptions   Medication Sig   . Albuterol 90 MCG/ACT Inhalation Aero Soln Inhale 2 puffs every 4 to 6 hours as needed HFA substitution is authorized if albuterol is not available.   . Divalproex Sodium 500 MG Oral Tab EC take 1.5 pills qhs, FROM [facility]   . Fluticasone Propionate  HFA (FLOVENT HFA) 220 MCG/ACT Inhalation Aerosol None Entered   . Hydrochlorothiazide 25 MG Oral Tab 1 TABLET DAILY   . Loratadine 10 MG Oral Cap None Entered   . Ranitidine HCl 150 MG Oral Tab 1 TABLET AT BEDTIME   . RISPERIDONE OR 50mg  injection--by [facility]   . Simvastatin 10 MG Oral Tab 1 TABLET AT BEDTIME     ALLERGIES:  Review of patient's allergies indicates:  No Known Allergies    CHIEF COMPLAINT: Loss Of Bowel/bladder Control, Breathing Problem and Refill Request    INTERVAL HISTORY/HPI:  Velmer missed his last appointment with me, and has come today because of recent loss of fecal control. He has lost control of his feces twice in the past few weeks, and also reports that he has also had difficulty urinating for the past few days. He does not have any difficulty walking, has not noticed weakness in his legs. He has not had any urinary incontinence. The stool he has lost is formed, not liquid. He has not had any numbness or tingling in his feet or hands. He does not have saddle anesthesia as far as a I can tell, though history is somewhat difficult. He does not report back pain.  He says that aside from recent loss of bowel control on two occasions, he has not had any other bowel movements.     PHYSICAL EXAM:  BP 171/69  Pulse 116  Temp 97.2 F (36.2 C)  Resp  16  Ht 5\' 4"  (1.626 m)  Wt 212 lb (96.163 kg)  BMI 36.39 kg/m2  GEN: NAD, pleasant man  NEURO: CNII-XII intact. Normal motor exam, 5/5 strength in upper and lower extremity. Gait slightly wide based. Normal romberg. Able to stand on toes and heels, and do deep knee bend. Sensation intact to light touch bilaterally. Downgoing babinsky. Normal RAM. Patellar and achilles reflexes 1+ bilaterally.   BACK: no CVAT, no midline back tenderness.  RECTAL: normal rectal tone, no stool felt in vault, no masses palpated. No saddle anesthesia, normal anal wink   GU: suprapubic fullness      LAB RESULTS:  CREATININE (mg/dL)   Date Value   04/13/1094 0.97      Post void residual Bladder Scan Results:  Exam Date: 04/16/2010  Urine Volume:    Urine dipstick showed >2000mg /ml glucose, otherwise negative     UA:  Color, URN Yellow Clarity, URN Clear SPECIFIC GRAVITY, URINE 1.026 1.002 - 1.027 g/mL PH, URINE 6.0 5.0 - 8.0 PROTEIN (ALB _ SemiQuant), URI Negative NRN mg/dL Glucose Qual, URN > or = 1000(3+) (A) NRN mg/dL KETONES, URINE Trace (A) NRN mg/dL BILIRUBIN (QUAL), URINE Negative NRN OCCULT BLOOD, URINE Negative NRN NITRITE, URINE Negative NRN LEUKOCYTE ESTERASE, URINE Negative  NRN UROBILINOGEN, URINE 0.1-1.9 URONML EHRLICH UNITS Comments for Macroscopic, URN None NONE WBC, URINE 0-5(NEG) Z5NEG /HPF RBC, URINE 0-2(NEG) Z2NEG /HPF Bacteria, URN Not Seen NOSEEN Epith Cells_Squamous, URN 0-5(NEG) LT6 /LPF Epith Cells_Renal/Trans,URN <3(NEG) LESS3 /HPF Comments for Microscopic, URN None NONE     Random blood glucose 255    ASSESSMENT AND PLAN:  45 year old man with developmental delay and schizophrenia, presenting with new bowel incontinence and urinary retention as well as new diagnosis of diabetes.    Bowel incontinence: originally concerned for possible spinal lesion, but normal neuro exam, and no lower extremity weakness are reassuring. More likely related to constipation and stool leaking around hard stools. We will try a  trial of a bulking agent.       Urinary retention: high post void residual, likely some element of obstruction/bladder dysfunction. does not have signs or symptoms of infection, which was originally high on the differential. Given urine with significant amounts of sugar, and high BG, he may have some polyuria and depending on how long this has been going on, he may also have autonomic dysfunction. Psych meds and anticholingeric effects may also play a role.   -placed foley today in clinic, will need to have it removed by urology in 2 weeks (will make urgent referral for this for him).  -placed a call to his sister vanita [phone] to talk toh er about this and make sure someone can help him if need be    Diabetes:  He needs a diabetes education visit and to be started on medication for this. His Cr is normal, so I will ask the team that sees him to start metformin for him. Additionally, he needs a diabetic eye exam, foot exam, spot cr/albumin ratio. His BP should be checked again, and if still high we should increase his lisinopril. Can also consider stopping HCTZ given his urinary retention. He has already been seeing karen conger for diet counseling, and clearly has not responded to a diet and exercise regimen. Checking his lipids will also be helpful at that visit. After his visit Friday, he should follow up with me in 1-2 months.       HCM:   Flu shot 04/16/10  -TdAP 2011 given    case manager Jaclyn Prime, cell [phone]  mother [name] can be reached at [phone]  sister Laurena Spies can be reached at work number [phone]    Seen and evaluated with Dr. Babs Sciara

## 2010-04-17 ENCOUNTER — Other Ambulatory Visit: Payer: Self-pay | Admitting: Pharmacist

## 2010-04-17 LAB — HEMOGLOBIN A1C, HPLC: Hemoglobin A1C: 10.4 % — ABNORMAL HIGH (ref 4.0–6.0)

## 2010-04-17 LAB — GLUCOSE POC, HMC: Glucose (POC): 282 mg/dL — ABNORMAL HIGH (ref 62–125)

## 2010-04-17 MED ORDER — LISINOPRIL 5 MG OR TABS
ORAL_TABLET | ORAL | Status: DC
Start: 2010-04-17 — End: 2010-04-20

## 2010-04-17 MED ORDER — LISINOPRIL 5 MG OR TABS
ORAL_TABLET | ORAL | Status: DC
Start: 2010-04-17 — End: 2010-04-17

## 2010-04-17 NOTE — Progress Notes (Signed)
Attending: Mardee Postin, MD  I saw and evaluated the patient with this resident.  I repeated the essential components of the history and exam.  I discussed the case with the resident, and have reviewed the resident's note. I agree  with the findings and plan as documented in the resident's note.  Key findings based upon my evaluation and discussion: 45 year old man with urinary retention and 2 episodes of fecal incontinence in the setting of new hyperglycemia (on HCTZ therapy).  Creatinine and electrolytes are normal.  UA negative for infection.  Foley placed.  Start psyllium.  Return for dedicated visit to do diabetes education and to start drug therapy with metformin.

## 2010-04-17 NOTE — Telephone Encounter (Signed)
Last visit = 04/16/09, RTC 1 or 2 months

## 2010-04-17 NOTE — Progress Notes (Signed)
Ordering Provider: Dr. Vernell Barrier  Attending: Dr. Babs Sciara    Per orders:  CBC and HgA1C drawn in clinic and blood specimen send to the lab.    Inserted indwelling 50F/5cc balloon urine foley catheter using sterile technique. Attached to leg bag and had of clear yellow urine returned. Procedure well tolerated by patient.  Teaching:  >>Always wash hands before and after doing catheter care.  >>Keep skin and cath. Clean, use warm water and a small towel with mild soap.  >>Always keep urin bag below the level of your bladder.  >>Do not tug or pull on the tubing.  >> Empty bag when 3/4 full and always before going to bed.  Patient repeated instructions back and verbalized understanding.     Random BG checked, results reported to provider.

## 2010-04-19 ENCOUNTER — Encounter (HOSPITAL_BASED_OUTPATIENT_CLINIC_OR_DEPARTMENT_OTHER): Payer: Self-pay | Admitting: Internal Medicine

## 2010-04-19 ENCOUNTER — Telehealth (HOSPITAL_BASED_OUTPATIENT_CLINIC_OR_DEPARTMENT_OTHER): Payer: Self-pay

## 2010-04-19 NOTE — Telephone Encounter (Signed)
Pt's sister calling stating she is returning Dr. Garnet Sierras call, unsure why Dr. Vernell Barrier called her.. She says she was unsuccessful in paging Dr. Vernell Barrier with pager # she was given.  I confirmed pager # and instructed her on how to use it.   Also gave her brief summary of pt's recent visit with Dr. Vernell Barrier.  She will try repaging Dr. Vernell Barrier and contacting pt's case manager.  Advised to call back if any more info needed/if did not hear from Dr. Vernell Barrier

## 2010-04-19 NOTE — Telephone Encounter (Signed)
04/19/10  2:00 PM  I returned calls from Jaclyn Prime, patient's Case Mgr and Patient's sister, Mahin Guardia. Neither new Patient has an indwelling foley Cath placed last Monday.    Luisa Hart spoke to dr. Vernell Barrier about an hour before we spoke  Luisa Hart stated he was just surprised he was not contacted to inform and was concerned with patient's poor hygiene care and how this would affect the catheter.  I explained adverse s/s of infection to look for and what to do and discussed daily Cath care. After our discussion, Luisa Hart reviewed his resources and said he could have an LPN visit patient twice a week and a shore worker 2 other days. He then felt patient would be okay with the extra care and reminders. Per Luisa Hart, patient is doing fine and will be here tomorrow for scheduled appointment at 8:00 AM with Med Students and Attending.    After my phone conversation with Maye Hides called back [place], patient's sister. Vanita felt fine with the care plan after explaining the plan and my discussion the Luisa Hart.  Both Luisa Hart and [name] now have my direct line number and I asked both to call if any questions.[name]

## 2010-04-19 NOTE — Telephone Encounter (Signed)
Case Manage Jaclyn Prime, [phone] will call Urology Cl to schedule appointment.

## 2010-04-20 ENCOUNTER — Encounter (HOSPITAL_BASED_OUTPATIENT_CLINIC_OR_DEPARTMENT_OTHER): Payer: Self-pay

## 2010-04-20 ENCOUNTER — Ambulatory Visit (HOSPITAL_BASED_OUTPATIENT_CLINIC_OR_DEPARTMENT_OTHER): Payer: Medicaid Other | Attending: Internal Medicine | Admitting: Internal Medicine

## 2010-04-20 VITALS — BP 141/62 | HR 95 | Temp 97.9°F | Wt 207.0 lb

## 2010-04-20 DIAGNOSIS — I10 Essential (primary) hypertension: Secondary | ICD-10-CM | POA: Insufficient documentation

## 2010-04-20 DIAGNOSIS — E119 Type 2 diabetes mellitus without complications: Secondary | ICD-10-CM | POA: Insufficient documentation

## 2010-04-20 DIAGNOSIS — R6250 Unspecified lack of expected normal physiological development in childhood: Secondary | ICD-10-CM | POA: Insufficient documentation

## 2010-04-20 DIAGNOSIS — F209 Schizophrenia, unspecified: Secondary | ICD-10-CM | POA: Insufficient documentation

## 2010-04-20 DIAGNOSIS — Z936 Other artificial openings of urinary tract status: Secondary | ICD-10-CM | POA: Insufficient documentation

## 2010-04-20 DIAGNOSIS — R339 Retention of urine, unspecified: Secondary | ICD-10-CM | POA: Insufficient documentation

## 2010-04-20 MED ORDER — EUCERIN EX CREA
TOPICAL_CREAM | CUTANEOUS | Status: DC
Start: 2010-04-20 — End: 2012-02-28

## 2010-04-20 MED ORDER — LISINOPRIL 10 MG OR TABS
ORAL_TABLET | ORAL | Status: DC
Start: 2010-04-20 — End: 2010-09-24

## 2010-04-20 MED ORDER — METFORMIN HCL 500 MG OR TABS
ORAL_TABLET | ORAL | Status: DC
Start: 2010-04-20 — End: 2010-05-21

## 2010-04-20 NOTE — Progress Notes (Signed)
ID/CC: Justin Zavala is a 45 year old male with schizophrenia and developmental delay who is newly diagnosed with diabetes. He presents to clinic today to discuss this new diagnosis and follow up with fecal incontinence and urinary retention problems that arose during his last clinic visit.     HPI:   Justin Zavala was diagnosed with diabetes in clinic on Apr 16, 2010. His HgA1c at that time was 10.4 and his urine showed 3+ glucose and trace ketones. Today he reports understanding this new diagnosis and that he will require medication to control it. Today he does not report any increased thirst or frequency of urination. He states that his diet consists mainly of "rice, ground beef and mushrooms," however, I spoke with his case manager Luisa Hart who also handles his finances and he states that most of his money is spent at [employer]. Justin Zavala states that he doesn't want to change his diet, but that his sister makes him drink diet soda now. He also expressed concern over wanting to eat birthday cake at a party that he has coming up. When it comes to exercise he states that he walks to [employer] often to get a newspaper and a diet soda. 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 He states that he has lost 5 pounds in the past few months.      His last clinic appointment he mentioned some loss of fecal control and was told to take metamucil. Today he states that he never took metamucil, but that his bowel problems resolved spontaneously. He has not had any loss of fecal control since his last appointment. He has not had any abdominal pain, constipation or diarrhea either.     Additionally, last clinic he also discussed some difficulty voiding urine which led to him having a foley catheter placed. His catheter is still in place today and he reports no problems emptying the bag. He reports no pain in his penis or groin. He states that his urine has been yellow, non-cloudy, and not had any blood in it. The  specifics on his urine have also been confirmed by his case manager.     PMH:   Schizophrenia   -followed by [facility]  Develomental delay  Asthma  GERD  HTN  Dyslipidemia  Obesity    Social:  Lives independently. His family is involved in his care, especially his sisters [place] and [place]. He has a case manager Luisa Hart who is very involved in his care and easy to reach. He also has Medicaid personal care come to his home twice a week to check in and help with basic health needs. His case manager is unsure at this time if they are authorized to provide diabetic foot care for him. There is also a question as to whether he has been letting them into his house or not. Luisa Hart is following this issue and the possibility of adult home placement may be raised in the future.     PE:  Vitals: BP 141/62, HR 95, T 97.9, Wt 207 lbs.  General: very pleasant obese male in no acute distress  HEENT: dental caries visualized, fundoscopic exam not able to be performed due to constricted pupils and patient not being able to tolerate  CV: regular rate and rhythm, no murmurs, S1 or S2  Chest: lungs clear to auscultation bilaterally  Extremities: Feet with very dry skin starting to crack in places, very long toenails, sensation fully intact to monofilament exam bilaterally. No  signs of foot ulcers or infection. Trace pitting edema on LEs bilaterally.    Assesement/Plan:  Justin Zavala is a 45 year old male with schizophrenia and developmental delay who is newly diagnosed with diabetes.     # Diabetes  He seems to be coping well his new diagnosis and exhibits some understanding of the diagnosis and implications. He understands that he will be adding a medication and that we would like him to change his diet. He does not want to change his diet and tells [place] that he eats a lot of vegetables and rice, although his case manger contradicts this and states that he spends his money at [employer]. He does walk and play basketball and states  that he has lost 5 pounds in the past few months. He does not have any typical symptoms of hyperglycemia today. His foot exam showed sensation intact to monofilament exam bilaterally. We were not able to perform a fundoscopic exam today, and retinal scan was also not tolerated by him today.     -Start Metformin 500 mg daily  -Appointment next Friday with diabetes nurse to continue education  -Appointment with his optometrist 1/30  -Eucerin cream given to treat dry skin on feet and prevent cracking    # HTN  He has been taking 5 mg of Lisinopril daily and his blood pressures continue to be elevated. Today his BP was 141/62.    -Increase dose of lisinopril to 10 mg daily  -Continue to follow BP in clinic    # Urinary retention  His foley catheter is still in place and he has no issues with it today. The etiology of his urinary retention is still unclear. His urine has been clear and he has no signs of infection.     -His case manager is following up with urology for an appointment to remove catheter- should be 1/23    # Loss of bowel control  Appears to be resolved, he reports no issues with diarrhea, constipation or loss of bowel control since his last appointment.

## 2010-04-20 NOTE — Progress Notes (Signed)
S: Mr. Holley is a 45yo gentleman w a h/o schizophrenia (on long-term risperdal), developmental delay, who is newly diagnosed with diabetes (A1c on 1/9 of 10.4) who returns to clinic today to start with his diabetes care. Today pt has no specific complaints and states his fecal incontinence resolved after he was seen in clinic (he never did take metamucil for this). Pt still has his foley in and has had no trouble. His case manager is scheduling f/u with urology. Pt otherwise denies fevers, CP, SOB, abd. Pain, numbness/tingling.     O:  VS- 97.9 141/62 95 wt 93.8kg  gen- alert and cooperative in NAD  CV- regular, nl S1S2, no m/r/g  Resp- CTA b/l  Abd- normoactive bowel sounds, NT, ND  Ext- dry,cracking skin on LE and feet b/l. Nl sensation by monofilament    Labs:   HgbA1c on 1/9- 10.4    A/P:   45yo M w newly diagnosed DM who presents today to start his care for this problem.    1. DM- Pt seems to have had long standing diabetes educator briefly met with pt and plan for pt to come back next Friday for further session. Would be ideal if family could be there as well.    -to meet with diabetes educator next Friday  -start metformin 500mg  once daily (will not titrate up right now given his social situation). Can consider this upon f/u in 1 month  -pt has outpt ophtho?. Will find out more about this and refer to ophtho here if not. Pt was not able to tolerate retinal exam in clinic today.  -will give eucerin cream for dry feet today and cont to follow with pt.   -hold off on checking urine cr/albumin ratio as going up on lisinopril today (see below)  -pt would likely continue to benefit from meeting w [name] as well re: nutrition/diet    2. HTN- BP not at goal despite attempts at weight loss. At this time with his new dx his BP goal is 130/80. Will increase lisinopril from 5-->10mg  daily today    3. Urinary retention- unclear etiology based on note from presentation. Pt doing well with foley. Spoke w pt's case  manager who is waiting to hear back from urology clinic for appt. Time. Pt should have voiding trial at that time.    4. Fecal incontinence- resolved. Thought to be 2/2 constipation.    RTC in 1 month to see Dr. Vernell Barrier.    Please see excellent MS3 note for further details.

## 2010-04-25 NOTE — Progress Notes (Signed)
Addended by: Zenon Mayo on: 04/25/2010 12:12 PM     Modules accepted: Level of Service

## 2010-04-25 NOTE — Progress Notes (Signed)
Attending: Zenon Mayo, MD  I have personally discussed the case and examined the patient with the resident during the patient visit including review of history, physical exam, diagnosis and treatment plan.  Any additional comments are below.    Clarifications:   Patient to meet with Renelda Mom next Friday to continue education.    -if tolerating metformin would increase the dose  Referred to ophtho for evaluation as did not tolerate retinal scan today in clinic.  Has some sort of eye care on the outside, but case manager not clear if optometry or ophthamology.  -recommend discussing all matters with case manager when able as his story varied from the patients.  -case manager also reports that family may not be as involved as they have first appeared.

## 2010-04-27 ENCOUNTER — Ambulatory Visit (HOSPITAL_BASED_OUTPATIENT_CLINIC_OR_DEPARTMENT_OTHER): Payer: Medicaid Other | Attending: Internal Medicine

## 2010-04-27 VITALS — BP 151/57 | HR 97 | Ht 63.0 in | Wt 202.8 lb

## 2010-04-27 DIAGNOSIS — Z7189 Other specified counseling: Secondary | ICD-10-CM | POA: Insufficient documentation

## 2010-04-27 DIAGNOSIS — E119 Type 2 diabetes mellitus without complications: Secondary | ICD-10-CM | POA: Insufficient documentation

## 2010-04-27 NOTE — Progress Notes (Signed)
Diagnosed: 2012  Eye exam (annually):  Needs  Foot exam: (annually if neuropathy absent, every visit otherwise)- 04/20/10 Risk category 0  Pneumo vaccine : 03/12/02  Flu shot (annually): 04/16/10  HGBA1c (every 6 months):A1C     10.4   04/16/2010  LDL (every 6 months): No results found for this basename: ldl  Alb/Cr ratio goal <30 (annually) : No results found for this basename: microalbumin  Creatinine: CREATININE     0.97   04/16/2010      ID  Visit type: Appointed  Interpreter present: None  Referral type: Written by Zenon Mayo on 04/20/10 for diabetes    SUBJECTIVE  Justin Zavala is a 45 year old male  here today to see nurse for an initial visit for diabetes education.      LIFESTYLE  -Diet: Currently eats breakfast around 7 am and dinner around 7 pm.  He may have an afternoon snack of an apple. He drinks water and diet pop.  He used to drink regular pop.  -Exercise: No changes in activity level; he reports that he goes bowling and plays basketball some but this sounds very limited.  He does walk for about an hour every day from his apartment to [facility] and back.   -Other: He has not started either his metformin or his increased lisinopril which were ordered 1/13.  He states that he always starts his meds on Sunday so is waiting until Sunday to start and then plans to take them every day.  He will take his metformin after he eats breakfast which is his routine with his am meds.  I stressed the importance of taking them every day since his blood pressure is somewhat elevated today.  I also told him that side effects of metformin are limited if he takes it with food.    DIABETES INFORMATION  -Home Glucometer Reading: Does not test currently  -Diabetic Foot Exam Risk Category: 0 (no loss of sensation)    ALLERGIES  Review of patient's allergies indicates:  No Known Allergies          ASSESSMENT/PLAN       Provided intitial diabetes education today.  This was very basic and given his cognitive limitations I will go  slowly. I will contact his case manager Luisa Hart to give him my contact information.    -Medication Management: No change to medication    MEDICATIONS  Current Outpatient Prescriptions   Medication Sig   . Albuterol 90 MCG/ACT Inhalation Aero Soln Inhale 2 puffs every 4 to 6 hours as needed HFA substitution is authorized if albuterol is not available.   . Divalproex Sodium 500 MG Oral Tab EC take 1.5 pills qhs, FROM [facility]   . Fluticasone Propionate  HFA (FLOVENT HFA) 220 MCG/ACT Inhalation Aerosol None Entered   . Hydrochlorothiazide 25 MG Oral Tab 1 TABLET DAILY   . Lisinopril 10 MG Oral Tab Take 1 tablet by mouth daily   . Loratadine 10 MG Oral Cap None Entered   . MetFORMIN HCl 500 MG Oral Tab Take 1 tablet by mouth one time daily   . Ranitidine HCl 150 MG Oral Tab 1 TABLET AT BEDTIME   . RISPERIDONE OR 50mg  injection--by [facility]   . Simvastatin 10 MG Oral Tab 1 TABLET AT BEDTIME   . Skin Protectants, Misc. (EUCERIN) External Cream Apply to affected area(s) 1 to 2 times a day for dry skin         EDUCATION  -  Primary learner(s): Patient; he was by himself  -Patient challenges to learning: Cognitive  -Challenges impacting this teaching session: Cognitive  -Education materials provided: None  -Post education response: restates information    Topics Discussed:  Diabetes  Diabetes discussion topics included:-Definition; complications  Glucometer education: Did not discuss    Nutrition  Nutritional discussion topics included: -Portion size    Exercise  Exercise discussion topics included: -Importance of exercise    Medication  Medication education discussion topics included:  Potential side effects of metformin, taking it with food  TIME SPENT  -Total time spent teaching: 40 minutes  -Total visit time: 45 minutes

## 2010-05-11 ENCOUNTER — Ambulatory Visit (HOSPITAL_BASED_OUTPATIENT_CLINIC_OR_DEPARTMENT_OTHER): Payer: Medicaid Other | Attending: Internal Medicine

## 2010-05-11 VITALS — BP 147/55 | HR 109 | Ht 63.0 in | Wt 202.6 lb

## 2010-05-11 DIAGNOSIS — E119 Type 2 diabetes mellitus without complications: Secondary | ICD-10-CM | POA: Insufficient documentation

## 2010-05-11 NOTE — Progress Notes (Signed)
Diagnosed: January 2012  Eye exam (annually):  Needs, ?outside provider  Foot exam: (annually if neuropathy absent, every visit otherwise)- 04/20/10 risk category 0  Pneumo vaccine : 03/12/02  Flu shot (annually): 04/16/10  HGBA1c (every 6 months):A1C     10.4   04/16/2010  LDL (every 6 months): No results found for this basename: ldl  Alb/Cr ratio goal <30 (annually) : No results found for this basename: microalbumin  Creatinine: CREATININE     0.97   04/16/2010      ID  Visit type: Appointed  Interpreter present: None  Referral type:  by  Stacy Gardner    SUBJECTIVE  Justin Zavala is a 45 year old male here today to see nurse for follow-up re diabetes     LIFESTYLE  -Diet: states he is now drinking only diet pop, no regular pop, drinking water, no chips and does not eat at [facility]. His breakfast around 7 am is usually Rasin Bran with soy milk.  He may have fruit for lunch or skip it.  For dinner he may have pasta with tomato sauce and ground beef with a vegetable  -Exercise: He reports that he is walking more, plays some basketball some times and plans to do track in the spring  -Other: He has started taking metformin 500 mg qs and does not report any problems or side effects.  He is hopeful that he may be able to get a job at a store or at [employer].  His case manager Luisa Hart is helping him find work    DIABETES INFORMATION  -Home Glucometer Reading: Not monitoring  -Diabetic Foot Exam Risk Category: Not done today  ALLERGIES  Review of patient's allergies indicates:  No Known Allergies        ASSESSMENT/PLAN        He remembered that exercise is good for diabetes.  Due to cognitive limitations we are going very slowly with education.    -Medication Management: No change to medication    MEDICATIONS  Current Outpatient Prescriptions   Medication Sig   . Albuterol 90 MCG/ACT Inhalation Aero Soln Inhale 2 puffs every 4 to 6 hours as needed HFA substitution is authorized if albuterol is not available.   . Divalproex  Sodium 500 MG Oral Tab EC take 1.5 pills qhs, FROM [facility]   . Fluticasone Propionate  HFA (FLOVENT HFA) 220 MCG/ACT Inhalation Aerosol None Entered   . Hydrochlorothiazide 25 MG Oral Tab 1 TABLET DAILY   . Lisinopril 10 MG Oral Tab Take 1 tablet by mouth daily   . Loratadine 10 MG Oral Cap None Entered   . MetFORMIN HCl 500 MG Oral Tab Take 1 tablet by mouth one time daily   . Ranitidine HCl 150 MG Oral Tab 1 TABLET AT BEDTIME   . RISPERIDONE OR 50mg  injection--[facility]   . Simvastatin 10 MG Oral Tab 1 TABLET AT BEDTIME   . Skin Protectants, Misc. (EUCERIN) External Cream Apply to affected area(s) 1 to 2 times a day for dry skin         EDUCATION  -Primary learner(s): Patient  -Patient challenges to learning: Cognitive  -Challenges impacting this teaching session: Cognitive  -Education materials provided: None  -Post education response: states understanding    Topics Discussed:  Diabetes  Diabetes discussion topics included:-Balance of diet, meds, and activity  Glucometer education: Did not discuss    Nutrition  Nutritional discussion topics included: -Portion size  Exercise  Exercise discussion topics included: -Importance of exercise    Medication  Medication education discussion topics included: Did not discuss    TIME SPENT  -Total time spent teaching: 20 minutes  -Total visit time: 25 minutes

## 2010-05-21 ENCOUNTER — Ambulatory Visit (HOSPITAL_BASED_OUTPATIENT_CLINIC_OR_DEPARTMENT_OTHER): Payer: Medicaid Other | Admitting: Internal Medicine

## 2010-05-21 ENCOUNTER — Ambulatory Visit (HOSPITAL_BASED_OUTPATIENT_CLINIC_OR_DEPARTMENT_OTHER): Payer: Medicaid Other

## 2010-05-21 ENCOUNTER — Ambulatory Visit (HOSPITAL_BASED_OUTPATIENT_CLINIC_OR_DEPARTMENT_OTHER): Payer: Medicaid Other | Attending: Internal Medicine | Admitting: Registered"

## 2010-05-21 VITALS — Ht 63.0 in | Wt 200.3 lb

## 2010-05-21 VITALS — BP 146/59 | HR 97 | Temp 97.7°F | Wt 201.4 lb

## 2010-05-21 DIAGNOSIS — F209 Schizophrenia, unspecified: Secondary | ICD-10-CM | POA: Insufficient documentation

## 2010-05-21 DIAGNOSIS — I10 Essential (primary) hypertension: Secondary | ICD-10-CM | POA: Insufficient documentation

## 2010-05-21 DIAGNOSIS — Z713 Dietary counseling and surveillance: Secondary | ICD-10-CM | POA: Insufficient documentation

## 2010-05-21 DIAGNOSIS — E119 Type 2 diabetes mellitus without complications: Secondary | ICD-10-CM | POA: Insufficient documentation

## 2010-05-21 DIAGNOSIS — R339 Retention of urine, unspecified: Secondary | ICD-10-CM | POA: Insufficient documentation

## 2010-05-21 MED ORDER — METFORMIN HCL 500 MG OR TABS
ORAL_TABLET | ORAL | Status: DC
Start: 2010-05-21 — End: 2010-07-20

## 2010-05-21 MED ORDER — METFORMIN HCL 500 MG OR TABS
ORAL_TABLET | ORAL | Status: DC
Start: 2010-05-21 — End: 2010-05-21

## 2010-05-21 NOTE — Progress Notes (Signed)
Adult Medicine Clinic - Outpatient Return Clinic Note   DATE: 05/21/2010   Justin Zavala is a 45 year old male who is here today for follow up on diabetes management. An interpreter was not needed for the visit.   PROBLEM LIST:   Patient Active Problem List    Diagnoses  Date Noted    .  DIABETES UNCOMPL ADULT-TYPE II  04/20/2010      MEDICATIONS:   Current Outpatient Prescriptions    Medication  Sig    .  Albuterol 90 MCG/ACT Inhalation Aero Soln  Inhale 2 puffs every 4 to 6 hours as needed HFA substitution is authorized if albuterol is not available.    .  Divalproex Sodium 500 MG Oral Tab EC  take 1.5 pills qhs, FROM [facility]    .  Fluticasone Propionate HFA (FLOVENT HFA) 220 MCG/ACT Inhalation Aerosol  None Entered    .  Hydrochlorothiazide 25 MG Oral Tab  1 TABLET DAILY    .  Lisinopril 10 MG Oral Tab  Take 1 tablet by mouth daily    .  Loratadine 10 MG Oral Cap  None Entered    .  MetFORMIN HCl 500 MG Oral Tab  Take 1 tablet by mouth two times a day    .  Ranitidine HCl 150 MG Oral Tab  1 TABLET AT BEDTIME    .  RISPERIDONE OR  50mg  injection--[facility]    .  Simvastatin 10 MG Oral Tab  1 TABLET AT BEDTIME    .  Skin Protectants, Misc. (EUCERIN) External Cream  Apply to affected area(s) 1 to 2 times a day for dry skin      ALLERGIES:   Review of patient's allergies indicates:   No Known Allergies   CHIEF COMPLAINT: Follow-Up     INTERVAL HISTORY/HPI:   Justin Zavala has been doing very well since I saw him last. He has been following up with the diabetes educator and with [name] and has lost 15lbs. (!!) He had his trail of void with urology today and passed, and his foley was removed. He reports no pain or discomfort today. No burning with urination, suprapubic or penile pain.   He has also been taking his metformin without significant side effects.   PHYSICAL EXAM:   BP 146/59  Pulse 97  Temp(Src) 97.7 F (36.5 C) (Temporal)  Wt 201 lb 6.4 oz (91.354 kg)   GEN: NAD,  pleasant, well appearing man   LAB RESULTS:   None today   ASSESSMENT AND PLAN:   Justin Zavala is a 45 year old man with schizophrenia and developmental delay, recently diagnosed with diabetes. He was seen today for follow up diabetes and HTN management.   DMII: pt is obese and also on antipsychotics, which predispose him to obesity, DM, and metabolic syndrome.   On Metformin, increased today to 500mg  PO BID (started in [place] on 500mg  qhs)   A1c was 10.4 04/16/10 (at diagnosis)   Lipids: will re-check when BG under better control, currently on simvastatin 10mg  PO qhs   Eyes: Has optho visit scheduled 05/2010   Renal: Cr 0.79 (1/12), Albumin/Cr ratio not completed   Foot exam normal 04/2010   Vaccines: Flu vaccine given 04/2010, pneumovax 2003   HTN: remains hypertensive on Lisinopril 10mg  PO qday, will have him follow up for BP check in one month with pharmacy. At that time, if systolic still above 130, would like pharmacy to increase Lisinopril to  20mg  PO qday and schedule re-check   Asthma/Allergy (not discussed today)   Continue albuterol, fluticasone, loratidine   GERD (not discussed):   Continue ranitidine   Schizoprhenia, managed at [facility]; defer to primary mental health providers   CV: Aspirin: will eval when checking lipids next   Lipids: as above   Glucose: actively managing DM as above   AAA(m): not indicated   CA: Colonoscopy: not indicated yet   ID: --pt has never been sexually active   HIV:   Hepatitis:   Syphilis:   TB:   Tdap: given 10/2009   Pvax: 2003   Flu: UTD 2011/12   Zoster: not indicated   Next visit:   Check A1c, alb/cr ratio, lipids   case manager Jaclyn Prime, cell [phone]   mother [name] can be reached at [phone]   sister Laurena Spies can be reached at work number [phone]    -------------------------------------------  Attending: Zenon Mayo, MD  I discussed the history, exam and medical decision making for this patient with Dr. Bettey Costa APPERSON. I was present in the clinic  during the provisions of these services with no other clinical duties. I concur with the management plan as noted above.  -------------------------------------------

## 2010-05-21 NOTE — Progress Notes (Deleted)
Adult Medicine Clinic - Outpatient Return Clinic Note   DATE: 05/21/2010   Justin Zavala is a 45 year old male who is here today for follow up on diabetes management. An interpreter was not needed for the visit.   PROBLEM LIST:   Patient Active Problem List    Diagnoses  Date Noted    .  DIABETES UNCOMPL ADULT-TYPE II  04/20/2010      MEDICATIONS:   Current Outpatient Prescriptions    Medication  Sig    .  Albuterol 90 MCG/ACT Inhalation Aero Soln  Inhale 2 puffs every 4 to 6 hours as needed HFA substitution is authorized if albuterol is not available.    .  Divalproex Sodium 500 MG Oral Tab EC  take 1.5 pills qhs, FROM [facility]    .  Fluticasone Propionate HFA (FLOVENT HFA) 220 MCG/ACT Inhalation Aerosol  None Entered    .  Hydrochlorothiazide 25 MG Oral Tab  1 TABLET DAILY    .  Lisinopril 10 MG Oral Tab  Take 1 tablet by mouth daily    .  Loratadine 10 MG Oral Cap  None Entered    .  MetFORMIN HCl 500 MG Oral Tab  Take 1 tablet by mouth two times a day    .  Ranitidine HCl 150 MG Oral Tab  1 TABLET AT BEDTIME    .  RISPERIDONE OR  50mg injection--by [facility]    .  Simvastatin 10 MG Oral Tab  1 TABLET AT BEDTIME    .  Skin Protectants, Misc. (EUCERIN) External Cream  Apply to affected area(s) 1 to 2 times a day for dry skin      ALLERGIES:   Review of patient's allergies indicates:   No Known Allergies   CHIEF COMPLAINT: Follow-Up     INTERVAL HISTORY/HPI:   Justin Zavala has been doing very well since I saw him last. He has been following up with the diabetes educator and with Karen Conger and has lost 15lbs. (!!) He had his trail of void with urology today and passed, and his foley was removed. He reports no pain or discomfort today. No burning with urination, suprapubic or penile pain.   He has also been taking his metformin without significant side effects.   PHYSICAL EXAM:   BP 146/59  Pulse 97  Temp(Src) 97.7 F (36.5 C) (Temporal)  Wt 201 lb 6.4 oz (91.354 kg)   GEN: NAD,  pleasant, well appearing man   LAB RESULTS:   None today   ASSESSMENT AND PLAN:   [name] is a 45 year old man with schizophrenia and developmental delay, recently diagnosed with diabetes. He was seen today for follow up diabetes and HTN management.   DMII: pt is obese and also on antipsychotics, which predispose him to obesity, DM, and metabolic syndrome.   On Metformin, increased today to 500mg PO BID (started in jan on 500mg qhs)   A1c was 10.4 04/16/10 (at diagnosis)   Lipids: will re-check when BG under better control, currently on simvastatin 10mg PO qhs   Eyes: Has optho visit scheduled 05/2010   Renal: Cr 0.79 (1/12), Albumin/Cr ratio not completed   Foot exam normal 04/2010   Vaccines: Flu vaccine given 04/2010, pneumovax 2003   HTN: remains hypertensive on Lisinopril 10mg PO qday, will have him follow up for BP check in one month with pharmacy. At that time, if systolic still above 130, would like pharmacy to increase Lisinopril to   20mg PO qday and schedule re-check   Asthma/Allergy (not discussed today)   Continue albuterol, fluticasone, loratidine   GERD (not discussed):   Continue ranitidine   Schizoprhenia, managed at [facility]; defer to primary mental health providers   CV: Aspirin: will eval when checking lipids next   Lipids: as above   Glucose: actively managing DM as above   AAA(m): not indicated   CA: Colonoscopy: not indicated yet   ID: --pt has never been sexually active   HIV:   Hepatitis:   Syphilis:   TB:   Tdap: given 10/2009   Pvax: 2003   Flu: UTD 2011/12   Zoster: not indicated   Next visit:   Check A1c, alb/cr ratio, lipids   case manager [name] Russell, cell [phone]   mother Vanita can be reached at [phone]   sister Malia can be reached at work number [phone]    -------------------------------------------  Attending: Tao Satz, MD  I discussed the history, exam and medical decision making for this patient with Dr. HEARST, ADELAIDE APPERSON. I was present in the clinic  during the provisions of these services with no other clinical duties. I concur with the management plan as noted above.  -------------------------------------------

## 2010-05-21 NOTE — Patient Instructions (Signed)
Please start taking your metformin TWO TIMES A DAY. Once in the morning and once at night  Please make sure you eat less salt

## 2010-05-21 NOTE — Progress Notes (Signed)
Nutrition Note    Clinic:  Adult Medicine    Note Type: Reassessment    Referred by: Dr Kimberlee Nearing  Same day verbal order read back: No  Date referred: 03/2008    Reason For Visit: Weight management    An interpreter was not needed for the visit.    Assessment    Medical Diagnosis: Obesity, Hypertension, Hyperlipidemia, Esophogeal Reflux and Developmental Delay, Schizophrenia    Wt 200 lb 4.8 oz (90.855 kg)  Ht 5\' 3"  (1.6 m)  Body mass index is 35.48 kg/(m^2).    Weight History:   Vitals WEIGHT   04/16/2010 215 lb     Initial wt: 189.4lb (04/2008)    Adult BMI Classification: obese categ. II (35- 39.9)    Labs: A1c 6.1 (02/27/09)    Self monitored blood glucose: No    Current Outpatient Prescriptions   Medication Sig   . Albuterol 90 MCG/ACT Inhalation Aero Soln Inhale 2 puffs every 4 to 6 hours as needed HFA substitution is authorized if albuterol is not available.   . Divalproex Sodium 500 MG Oral Tab EC take 1.5 pills qhs, FROM [facility]   . Fluticasone Propionate  HFA (FLOVENT HFA) 220 MCG/ACT Inhalation Aerosol None Entered   . Hydrochlorothiazide 25 MG Oral Tab 1 TABLET DAILY   . Lisinopril 10 MG Oral Tab Take 1 tablet by mouth daily   . Loratadine 10 MG Oral Cap None Entered   . MetFORMIN HCl 500 MG Oral Tab Take 1 tablet by mouth two times a day   . Ranitidine HCl 150 MG Oral Tab 1 TABLET AT BEDTIME   . RISPERIDONE OR 50mg  injection--by [facility]   . Simvastatin 10 MG Oral Tab 1 TABLET AT BEDTIME   . Skin Protectants, Misc. (EUCERIN) External Cream Apply to affected area(s) 1 to 2 times a day for dry skin   Ranitidine, Simvastatin, Risperdal    Vitamins/Minerals/Herbal Supplements: not discussed    Nutrition requirements: not discussed    Barriers to adequate intake: None    GI tolerance: None      History   Substance Use Topics   . Smoking status: Never Smoker    . Smokeless tobacco: Never Used   . Alcohol Use: No         Activity: Pt participating in bowling and basketball.  He is also  walking and planning on track this spring     Special/alternative diet: None    Food intolerances/allergies: None    Diet History: Pt has care giver who is preparing some meals   He states that he still has grocery list we developed and he is using this regularly.  He states that he has stopped eating junk food which he qualifies as fast food. He is also paying attention to portions and states he plans to stop eating seconds.     Information from Patient: none    Assessment Summary: 6lb weight loss in past 2 months.      Nutrition Diagnosis: Inability to manage self care  related to develomental delay evidenced by difficulty sticking to recommendations.    Diagnosis Reassessment:  _ Resolved (nutrition problem no longer exists)  _ Improvement shown (Nutrition problem still exists).  Pt has lost 15lb in past month and seems to be following recommendations from previous visits  _ Unresolved no improvement shown  _ No longer appropriate (change in condition)    Interventions: Reviewed meal ideas and gave him another copy of meal  ideas at home and grocery list    Commend him for his efforts and 15lb weight loss!!!    Education Provided:  Please refer to Education section of chart     Patient stated goals: Weight loss; goal of 5lb in the next month    Nutrition topics taught: Weight Management::  Healthy choices and portion control    Education materials provided: YES:  home meal ideas (w/ portions), grocery lists    Time spent teaching: 15 mins      Plan/Recommendations    Follow up: In 1 month(s)    Plan: Use meal plans and grocery lists.      Total Time Spent:  45 mins    [name], MS, RD, CD, CDE  [facility]  [phone]  [address], [place], [place] [address]  Pager: [phone]

## 2010-05-25 ENCOUNTER — Encounter (HOSPITAL_BASED_OUTPATIENT_CLINIC_OR_DEPARTMENT_OTHER): Payer: Medicaid Other

## 2010-06-06 ENCOUNTER — Encounter (HOSPITAL_BASED_OUTPATIENT_CLINIC_OR_DEPARTMENT_OTHER): Payer: Medicaid Other | Admitting: Ophthalmology

## 2010-06-08 ENCOUNTER — Encounter (HOSPITAL_BASED_OUTPATIENT_CLINIC_OR_DEPARTMENT_OTHER): Payer: Medicaid Other

## 2010-06-13 ENCOUNTER — Encounter (HOSPITAL_BASED_OUTPATIENT_CLINIC_OR_DEPARTMENT_OTHER): Payer: Medicaid Other | Admitting: Ophthalmology

## 2010-06-14 ENCOUNTER — Encounter (HOSPITAL_BASED_OUTPATIENT_CLINIC_OR_DEPARTMENT_OTHER): Payer: Self-pay | Admitting: Ophthalmology

## 2010-06-15 ENCOUNTER — Encounter (HOSPITAL_BASED_OUTPATIENT_CLINIC_OR_DEPARTMENT_OTHER): Payer: Medicaid Other

## 2010-06-18 ENCOUNTER — Encounter (HOSPITAL_BASED_OUTPATIENT_CLINIC_OR_DEPARTMENT_OTHER): Payer: Medicaid Other

## 2010-06-18 ENCOUNTER — Encounter (HOSPITAL_BASED_OUTPATIENT_CLINIC_OR_DEPARTMENT_OTHER): Payer: Medicaid Other | Admitting: Registered"

## 2010-06-19 ENCOUNTER — Encounter (HOSPITAL_BASED_OUTPATIENT_CLINIC_OR_DEPARTMENT_OTHER): Payer: Medicaid Other

## 2010-06-22 ENCOUNTER — Encounter (HOSPITAL_BASED_OUTPATIENT_CLINIC_OR_DEPARTMENT_OTHER): Payer: Medicaid Other

## 2010-06-29 ENCOUNTER — Encounter (HOSPITAL_BASED_OUTPATIENT_CLINIC_OR_DEPARTMENT_OTHER): Payer: Medicaid Other

## 2010-07-02 ENCOUNTER — Encounter (HOSPITAL_BASED_OUTPATIENT_CLINIC_OR_DEPARTMENT_OTHER): Payer: Medicaid Other

## 2010-07-09 ENCOUNTER — Encounter (HOSPITAL_BASED_OUTPATIENT_CLINIC_OR_DEPARTMENT_OTHER): Payer: Medicaid Other | Admitting: Registered"

## 2010-07-09 ENCOUNTER — Encounter (HOSPITAL_BASED_OUTPATIENT_CLINIC_OR_DEPARTMENT_OTHER): Payer: Medicaid Other

## 2010-07-13 ENCOUNTER — Encounter (HOSPITAL_BASED_OUTPATIENT_CLINIC_OR_DEPARTMENT_OTHER): Payer: Medicaid Other | Admitting: Registered"

## 2010-07-16 ENCOUNTER — Encounter (HOSPITAL_BASED_OUTPATIENT_CLINIC_OR_DEPARTMENT_OTHER): Payer: Medicaid Other

## 2010-07-20 ENCOUNTER — Ambulatory Visit (HOSPITAL_BASED_OUTPATIENT_CLINIC_OR_DEPARTMENT_OTHER): Payer: Medicaid Other | Admitting: Registered"

## 2010-07-20 ENCOUNTER — Ambulatory Visit (HOSPITAL_BASED_OUTPATIENT_CLINIC_OR_DEPARTMENT_OTHER): Payer: Medicaid Other | Admitting: Pharmacist

## 2010-07-20 ENCOUNTER — Ambulatory Visit (HOSPITAL_BASED_OUTPATIENT_CLINIC_OR_DEPARTMENT_OTHER): Payer: Medicaid Other | Attending: Internal Medicine

## 2010-07-20 VITALS — BP 127/61 | HR 83 | Ht 63.0 in | Wt 203.0 lb

## 2010-07-20 VITALS — Ht 63.0 in | Wt 203.0 lb

## 2010-07-20 DIAGNOSIS — E119 Type 2 diabetes mellitus without complications: Secondary | ICD-10-CM | POA: Insufficient documentation

## 2010-07-20 DIAGNOSIS — IMO0001 Reserved for inherently not codable concepts without codable children: Secondary | ICD-10-CM | POA: Insufficient documentation

## 2010-07-20 DIAGNOSIS — I10 Essential (primary) hypertension: Secondary | ICD-10-CM | POA: Insufficient documentation

## 2010-07-20 LAB — LIPID PANEL
Cholesterol (LDL): 84 mg/dL (ref <130)
Cholesterol/HDL Ratio: 4.8
HDL Cholesterol: 28 mg/dL — ABNORMAL LOW (ref >40)
Non-HDL Cholesterol: 105 mg/dL (ref 0–159)
Total Cholesterol: 133 mg/dL (ref <200)
Triglyceride: 104 mg/dL (ref <150)

## 2010-07-20 LAB — ALBUMIN/CREATININE RATIO, RANDOM URINE
Albumin (Micro), URN: 1.9 mg/dL
Albumin/Creatinine Ratio, URN: 8.8 mg/g Creat (ref <30)
Creatinine/Unit, URN: 217 mg/dL

## 2010-07-20 NOTE — Progress Notes (Signed)
Diagnosed: 04/16/10  Eye exam (annually):   No data found  Foot exam: (annually if neuropathy absent, every visit otherwise)- 04/20/10  Risk category 0  Pneumo vaccine : 03/12/02  Flu shot (annually): 04/16/10  HGBA1c (every 6 months):A1C     10.4   04/16/2010  LDL (every 6 months): LDL       84   07/20/2010  Alb/Cr ratio goal <30 (annually) : MICROALBUMIN      8.8   07/20/2010  Creatinine: CREATININE     0.97   04/16/2010    ID  Visit type: Appointed  Interpreter present: None  Referral type: Referred by Stacy Gardner, RN    SUBJECTIVE  Justin Zavala is a 45 year old male here today to see nurse for follow-up re diabetes.     LIFESTYLE  -Diet: Saw [name] just prior to this visit  -Exercise:  States he walks every other day to [employer].  He does not know how long that takes him or what the distance is.  He was bowling but that is over and he decided not to participate in [employer] or track this spring.  He may do T ball in the summer. He does spend time sometimes cleaning up his mother's house.   He thinks he could walk every day.  -Other:   He is aware that his metformin dose was increased and thinks he is taking it twice a day.  He has bubble packs.  He was unable to tell me when he takes it exactly. He denies side effects.  He is not checking his glucose and I have doubts he would be able to do that.     DIABETES INFORMATION  -Home Glucometer Reading: Does not have a meter.  -Diabetic Foot Exam Risk Category: Not performed today.    ALLERGIES  Review of patient's allergies indicates:  No Known Allergies      ASSESSMENT/PLAN         We focused on exercise today and the value of exercise in decreasing his glucose levels. He agreed to try walking every day instead of every other day.  He will see pharmacy after my appointment and I will see him again in a month when he schedules to meet with nutrition.    -Medication Management: No change to medication    MEDICATIONS  Current Outpatient Prescriptions   Medication  Sig   . Albuterol 90 MCG/ACT Inhalation Aero Soln Inhale 2 puffs every 4 to 6 hours as needed HFA substitution is authorized if albuterol is not available.   . Divalproex Sodium 500 MG Oral Tab EC take 1.5 pills qhs, FROM [facility]   . Fluticasone Propionate  HFA (FLOVENT HFA) 220 MCG/ACT Inhalation Aerosol None Entered   . Hydrochlorothiazide 25 MG Oral Tab 1 TABLET DAILY   . Lisinopril 10 MG Oral Tab Take 1 tablet by mouth daily   . Loratadine 10 MG Oral Cap None Entered   . MetFORMIN HCl 1000 MG Oral Tab Take 1 tablet by mouth 2 times a day   . Ranitidine HCl 150 MG Oral Tab 1 TABLET AT BEDTIME   . RISPERIDONE OR 50mg  injection--by [facility]   . Simvastatin 10 MG Oral Tab 1 TABLET AT BEDTIME   . Skin Protectants, Misc. (EUCERIN) External Cream Apply to affected area(s) 1 to 2 times a day for dry skin         EDUCATION  -Primary learner(s): Patient  -Patient challenges to  learning: Cognitive and Emotional   -Challenges impacting this teaching session: Cognitive and Emotional   -Education materials provided: None  -Post education response: restates information    Topics Discussed:  Diabetes  Diabetes discussion topics included:-Balance of diet, meds, and activity  Glucometer education: Did not discuss    Nutrition  Nutritional discussion topics included: Did not discuss    Exercise  Exercise discussion topics included: -Importance of exercise    Medication  Medication education discussion topics included: Did not discuss    TIME SPENT  -Total time spent teaching: 20 minutes  -Total visit time: 30 minutes

## 2010-07-20 NOTE — Progress Notes (Addendum)
Visit type: Appointed  Interpreter present: None  Referral type: Written by Dellis Anes, MD    SUBJECTIVE  Benaiah Behan is a 45 year old male here to see pharmacist for an initial visit. Seen briefly following visit with nutritionist and Synetta Fail, RN today, received education today regarding diet and exercise. Can identify that metformin is large, white pill and taking BID through bubble packs filled by [facility] pharmacy. Reports no problems with taking metformin and good adherence to meds, willing to bring meds to PCP appt for verification.    ALLERGIES  Review of patient's allergies indicates:  No Known Allergies    MEDICATIONS: Not reviewed in detail today  Current Outpatient Prescriptions   Medication Sig   . Albuterol 90 MCG/ACT Inhalation Aero Soln Inhale 2 puffs every 4 to 6 hours as needed HFA substitution is authorized if albuterol is not available.   . Divalproex Sodium 500 MG Oral Tab EC take 1.5 pills qhs, FROM [facility]   . Fluticasone Propionate  HFA (FLOVENT HFA) 220 MCG/ACT Inhalation Aerosol None Entered   . Hydrochlorothiazide 25 MG Oral Tab 1 TABLET DAILY   . Lisinopril 10 MG Oral Tab Take 1 tablet by mouth daily   . Loratadine 10 MG Oral Cap None Entered   . MetFORMIN HCl 1000 MG Oral Tab Take 1 tablet by mouth 2 times a day --> increased today from 500mg  BID   . Ranitidine HCl 150 MG Oral Tab 1 TABLET AT BEDTIME   . RISPERIDONE OR 50mg  injection--by [facility]   . Simvastatin 10 MG Oral Tab 1 TABLET AT BEDTIME   . Skin Protectants, Misc. (EUCERIN) External Cream Apply to affected area(s) 1 to 2 times a day for dry skin     LABS  A1c = 10.4% on 04/16/10    VITALS  There were no vitals filed for this visit.  BP Readings from Last 3 Encounters:   05/21/10 146/59   05/11/10 147/55   04/27/10 151/57     Pulse Readings from Last 3 Encounters:   05/21/10 97   05/11/10 109   04/27/10 97     EDUCATION  -Primary learner(s): Patient  -Patient challenges to learning:  Cognitive  -Challenges impacting this teaching session: Cognitive  -Education materials provided: None  -Post education response: states understanding and restates information    Medication  Medication education discussion topics included: Did not discuss      ASSESSMENT/PLAN    DM. Poorly controlled based on A1c from January, working on exercise and nutrition and verified with [place] currently metformin 500 BID. Challenges to controlling BG will include inability to independently inject insulin, may need to focus on optimizing PO meds (metformin + SFU) first but will reassess A1c today and maximize metformin prior to starting SFU. Fair recall of discussion with nutrition and RN regarding DM self-management.    - Increase metformin to 1g BID, called into [place] and will likely not be put into bubblepak for 2 more weeks  - Added refills to other chronic meds prn  - Encouraged pt to bring meds to next appt with PCP and pharmacy will tag-on for med eval prn    HTN. At goal, verified with pharmacy lisinopril which was started at last PCP visit now in bubble pack.    - No changes      FOLLOW UP: with PCP on Monday    TIME SPENT  -Total time spent teaching: 10 minutes  -Total visit time: 15  minutes

## 2010-07-20 NOTE — Progress Notes (Signed)
Nutrition Note    Clinic:  Adult Medicine    Note Type: Reassessment    Referred by: Dr Kimberlee Nearing  Same day verbal order read back: No  Date referred: 03/2008    Reason For Visit: Weight management    An interpreter was not needed for the visit.    Assessment    Medical Diagnosis: Obesity, Hypertension, Hyperlipidemia, Esophogeal Reflux and Developmental Delay, Schizophrenia    Wt 203 lb (92.08 kg)  Ht 5\' 3"  (1.6 m)  Body mass index is 35.96 kg/(m^2).    Weight History:   Vitals WEIGHT   04/16/2010 215 lb     Initial wt: 189.4lb (04/2008)    Adult BMI Classification: obese categ. II (35- 39.9)    Labs: A1c 6.1 (02/27/09)    Self monitored blood glucose: No    Current Outpatient Prescriptions   Medication Sig   . Albuterol 90 MCG/ACT Inhalation Aero Soln Inhale 2 puffs every 4 to 6 hours as needed HFA substitution is authorized if albuterol is not available.   . Divalproex Sodium 500 MG Oral Tab EC take 1.5 pills qhs, FROM [facility]   . Fluticasone Propionate  HFA (FLOVENT HFA) 220 MCG/ACT Inhalation Aerosol None Entered   . Hydrochlorothiazide 25 MG Oral Tab 1 TABLET DAILY   . Lisinopril 10 MG Oral Tab Take 1 tablet by mouth daily   . Loratadine 10 MG Oral Cap None Entered   . MetFORMIN HCl 1000 MG Oral Tab Take 1 tablet by mouth 2 times a day   . Ranitidine HCl 150 MG Oral Tab 1 TABLET AT BEDTIME   . RISPERIDONE OR 50mg  injection--by [facility]   . Simvastatin 10 MG Oral Tab 1 TABLET AT BEDTIME   . Skin Protectants, Misc. (EUCERIN) External Cream Apply to affected area(s) 1 to 2 times a day for dry skin   Ranitidine, Simvastatin, Risperdal    Vitamins/Minerals/Herbal Supplements: not discussed    Nutrition requirements: not discussed    Barriers to adequate intake: None    GI tolerance: None      History   Substance Use Topics   . Smoking status: Never Smoker    . Smokeless tobacco: Never Used   . Alcohol Use: No     Activity: Pt trying to walk more.  He did not sign up for track in time but is  hoping to sign up for bowling    Special/alternative diet: None    Food intolerances/allergies: None    Diet History: Pt has care giver who is preparing meals on Tuesdays and Fridays.  He is also going to his sister's for dinner on Sundays- he is not sure it is always healthy.      He would like another copy of the grocery list, restaurant meals and menu for home    Information from Patient: none    Assessment Summary: 3lb weight gain in past 2 months    Nutrition Diagnosis: Inability to manage self care  related to develomental delay evidenced by difficulty sticking to recommendations.    Diagnosis Reassessment:  _ Resolved (nutrition problem no longer exists)  _ Improvement shown (Nutrition problem still exists).  Pt has gained 3lb in 2 months He benefits from continued support and education  _ Unresolved no improvement shown  _ No longer appropriate (change in condition)    Interventions: Reviewed meal ideas and gave him another copy of meal ideas at home and restaurant and grocery list    Education  Provided:  Please refer to Education section of chart     Patient stated goals: Weight loss; healthy eating    Nutrition topics taught: Weight Management::  Healthy choices and portion control    Education materials provided: YES:  home and restaurant meal ideas (w/ portions), grocery lists    Time spent teaching: 10 mins      Plan/Recommendations    Follow up: In 1 month(s)    Plan: Use meal plans and grocery lists.      Total Time Spent:  45 mins    [name], MS, RD, CD, CDE  [facility] Diet[facility]  Box [phone]  [address], [place], [place] [address]  Pager: [phone]

## 2010-07-22 NOTE — Progress Notes (Signed)
I have discussed the patient with the resident and was present for the evaluation. I agree with the above assessment and plan.  Any additional comments are below or have been added to the above note.    Greta M Sweney, RPh

## 2010-07-23 ENCOUNTER — Telehealth (HOSPITAL_BASED_OUTPATIENT_CLINIC_OR_DEPARTMENT_OTHER): Payer: Self-pay | Admitting: Registered Nurse

## 2010-07-23 ENCOUNTER — Encounter (HOSPITAL_BASED_OUTPATIENT_CLINIC_OR_DEPARTMENT_OTHER): Payer: Medicaid Other | Admitting: Internal Medicine

## 2010-07-23 LAB — HEMOGLOBIN A1C, HPLC: Hemoglobin A1C: 8.7 % — ABNORMAL HIGH (ref 4.0–6.0)

## 2010-07-23 NOTE — Telephone Encounter (Signed)
Message copied by Baldemar Friday on Mon Jul 23, 2010 10:07 AM  ------       Message from: Dagoberto Ligas       Created: Mon Jul 23, 2010  8:27 AM       Regarding: Adult Medicine     [place]    Test Results       Contact: [phone]         CONFIRMED PHONE NUMBER: [phone]       CALLERS FIRST AND LAST NAME: Gwenith Spitz              FACILITY NAME:  TITLE:        CALLERS RELATIONSHIP:Self       RETURN CALL: Detailed message on voicemail only              SUBJECT: Results Request        REASON FOR REQUEST: Test Results              WHO ORDERED THE TEST: Dr. Vernell Barrier       TYPE OF TEST? Blood/Urine       WHERE WAS THE TEST DONE: clinic       WHEN WAS THE TEST DONE: 07/20/10

## 2010-07-23 NOTE — Telephone Encounter (Signed)
Dr. Vernell Barrier is in clinic today. Will forward this message to her. Please call patient with results.

## 2010-07-27 ENCOUNTER — Encounter (HOSPITAL_BASED_OUTPATIENT_CLINIC_OR_DEPARTMENT_OTHER): Payer: Self-pay | Admitting: Internal Medicine

## 2010-07-30 ENCOUNTER — Encounter (HOSPITAL_BASED_OUTPATIENT_CLINIC_OR_DEPARTMENT_OTHER): Payer: Medicaid Other

## 2010-08-13 ENCOUNTER — Encounter (HOSPITAL_BASED_OUTPATIENT_CLINIC_OR_DEPARTMENT_OTHER): Payer: Medicaid Other | Admitting: Registered"

## 2010-08-17 ENCOUNTER — Encounter (HOSPITAL_BASED_OUTPATIENT_CLINIC_OR_DEPARTMENT_OTHER): Payer: Medicaid Other

## 2010-08-20 ENCOUNTER — Encounter (HOSPITAL_BASED_OUTPATIENT_CLINIC_OR_DEPARTMENT_OTHER): Payer: Medicaid Other | Admitting: Registered"

## 2010-08-24 ENCOUNTER — Encounter (HOSPITAL_BASED_OUTPATIENT_CLINIC_OR_DEPARTMENT_OTHER): Payer: Medicaid Other

## 2010-08-24 ENCOUNTER — Encounter (HOSPITAL_BASED_OUTPATIENT_CLINIC_OR_DEPARTMENT_OTHER): Payer: Medicaid Other | Admitting: Registered"

## 2010-09-07 ENCOUNTER — Encounter (HOSPITAL_BASED_OUTPATIENT_CLINIC_OR_DEPARTMENT_OTHER): Payer: Medicaid Other | Admitting: Registered"

## 2010-09-07 ENCOUNTER — Encounter (HOSPITAL_BASED_OUTPATIENT_CLINIC_OR_DEPARTMENT_OTHER): Payer: Medicaid Other

## 2010-09-21 ENCOUNTER — Encounter (HOSPITAL_BASED_OUTPATIENT_CLINIC_OR_DEPARTMENT_OTHER): Payer: Medicaid Other

## 2010-09-24 ENCOUNTER — Other Ambulatory Visit (HOSPITAL_BASED_OUTPATIENT_CLINIC_OR_DEPARTMENT_OTHER): Payer: Self-pay

## 2010-09-24 ENCOUNTER — Encounter (HOSPITAL_BASED_OUTPATIENT_CLINIC_OR_DEPARTMENT_OTHER): Payer: Medicaid Other | Admitting: Registered"

## 2010-09-24 ENCOUNTER — Ambulatory Visit (HOSPITAL_BASED_OUTPATIENT_CLINIC_OR_DEPARTMENT_OTHER): Payer: Medicaid Other

## 2010-09-24 ENCOUNTER — Ambulatory Visit (HOSPITAL_BASED_OUTPATIENT_CLINIC_OR_DEPARTMENT_OTHER): Payer: Medicaid Other | Admitting: Registered"

## 2010-09-24 ENCOUNTER — Encounter (HOSPITAL_BASED_OUTPATIENT_CLINIC_OR_DEPARTMENT_OTHER): Payer: Self-pay | Admitting: Internal Medicine

## 2010-09-24 ENCOUNTER — Ambulatory Visit (HOSPITAL_BASED_OUTPATIENT_CLINIC_OR_DEPARTMENT_OTHER): Payer: Medicaid Other | Attending: Internal Medicine | Admitting: Internal Medicine

## 2010-09-24 VITALS — BP 147/51 | HR 87 | Temp 97.5°F | Ht 63.0 in | Wt 200.6 lb

## 2010-09-24 VITALS — Ht 63.0 in | Wt 203.8 lb

## 2010-09-24 DIAGNOSIS — E119 Type 2 diabetes mellitus without complications: Secondary | ICD-10-CM | POA: Insufficient documentation

## 2010-09-24 DIAGNOSIS — I10 Essential (primary) hypertension: Secondary | ICD-10-CM | POA: Insufficient documentation

## 2010-09-24 MED ORDER — LISINOPRIL 10 MG OR TABS
ORAL_TABLET | ORAL | Status: DC
Start: 2010-09-24 — End: 2011-01-04

## 2010-09-24 NOTE — Progress Notes (Signed)
Adult Medicine Clinic - Outpatient Return Clinic Note    DATE: 09/24/2010     Justin Zavala is a 45 year old male who is here today for follow up. An interpreter was not needed for the visit.    PROBLEM LIST:  Patient Active Problem List   Diagnoses Date Noted   . DEVELOPMENT DELAYS NEC 05/21/2010     case manager Jaclyn Prime, cell [phone] is his guardian. Should be called after appointments to update him on any changes  mother [name] can be reached at [phone]   sister Laurena Spies can be reached at work number [phone]       . DIABETES UNCOMPL ADULT-TYPE II 04/20/2010     MEDICATIONS:   Reconciled with Burke Keels today  Current Outpatient Prescriptions   Medication Sig   . Albuterol 90 MCG/ACT Inhalation Aero Soln Inhale 2 puffs every 4 to 6 hours as needed HFA substitution is authorized if albuterol is not available.   . Divalproex Sodium 500 MG Oral Tab EC take 1.5 pills qhs, FROM [facility]   . Fluticasone Propionate  HFA (FLOVENT HFA) 220 MCG/ACT Inhalation Aerosol None Entered   . Hydrochlorothiazide 25 MG Oral Tab 1 TABLET DAILY   . Lisinopril 10 MG Oral Tab Take 1 tablet by mouth daily   . Loratadine 10 MG Oral Cap None Entered   . MetFORMIN HCl 1000 MG Oral Tab Take 1 tablet by mouth 2 times a day   . Ranitidine HCl 150 MG Oral Tab 1 TABLET AT BEDTIME   . RISPERIDONE OR 50mg  injection--by [facility]   . Simvastatin 10 MG Oral Tab 1 TABLET AT BEDTIME   . Skin Protectants, Misc. (EUCERIN) External Cream Apply to affected area(s) 1 to 2 times a day for dry skin     ALLERGIES:  Review of patient's allergies indicates:  No Known Allergies    CHIEF COMPLAINT: Diabetes    INTERVAL HISTORY/HPI:  Justin Zavala has lost 3 lbs since his last visit. His caregiver has been watching what he eats. Walking 4-8 blocks everyday. Will be starting T ball on Thursday till August. Bowling will start again in august.     Has a new caregiver in addition to [employer]. She is from [employer]. She cleans  and cooks for him. [employer] (sp?)    Saw optho in January, but no record here. Needs to get optho scan done today.     Saw urology, per his report they told, him to go to the bathroom before bed and when he wakes up in the am.    Asthma: its 'ok' not needing relief inhaler much, taking allergy meds and they work well.     Reports polyuria today (on HCTZ though too). No polydypsea. Feet w/o tingling, reports checking daily for cuts/ulcers and finding none. No episodes of sweatiness, dizziness, lightheadedness.     Will call [facility] today to verify his meds today. Unable to reconcile with him. Also increasing his lisinopril to 20mg  PO, given elevated BP today.    PHYSICAL EXAM:  BP 147/51  Pulse 87  Temp(Src) 97.5 F (36.4 C) (Temporal)  Ht 5\' 3"  (1.6 m)  Wt 200 lb 9.6 oz (90.992 kg)  BMI 35.53 kg/m2  BG 134 today  GEN: NAD  CV: rrr no r/m/g  PULM: CTAB  EXT: gait normal, no edema  FEET: not examined    LAB RESULTS:  Component      Latest Ref Rng 07/20/2010 07/20/2010 07/20/2010  2:47 PM  2:47 PM  2:53 PM   CHOLESTEROL (TOTAL)      <200 mg/dL 161     TRIGLYCERIDES      <150 mg/dL 096     CHOLESTEROL (HDL)      >40 mg/dL 28 (L)     CHOLESTEROL (LDL)      <130 mg/dL 84     Non-HDL Cholesterol      0 - 159 mg/dL 045     CHOLESTEROL/HDL RATIO       4.8                       ALBUMIN/CREATININE RATIO      <30 mg/g Creat   8.8   Hemoglobin A1C, HPLC      4.0 - 6.0 %  8.7 (H)            ASSESSMENT AND PLAN:  45 year old man with developmental delay, schizophrenia and DMII.     DM: Last A1c in April 8.7, not at goal. Given his other comorbidities, would like to target <7 for him. Synetta Fail increased his Metformin to 1000BID at last visit.   Recheck a1c in July, add glipizide if not at goal yet  Lipids OK, will keep him on simvistatin 10mg  PO qhs  CV: not on ASA currently. Can consider if lipids look worse or we are unable to get control of DM  RENAL: Alb/cr ratio 8.8 in April 2012. On ACE  EYE: has recent optho  appt (with new glasses to prove it). Unable to tolerate fundal photo in clinic. Will call [facility] to get records from where he goes to eye appt.   FEET: normal exam 04/2010  Flu vaccine given 04/2010, pneumovax 2003     HTN: as above, have called [facility] to increase lisinopril to 20mg  PO qday    Allergy/asthma: appears under good control. No wheeze today, reports infrequent use of prn inhaler  Continue flovent/albuterol       Will call Luisa Hart to update him today.     Return to clinic 4-5 months. Recheck A1c when he sees anita in July.     Trellis Moment  [phone]    -------------------------------------------  Attending: Mardee Postin, MD  I discussed the history, exam and medical decision making for this patient with Dr. Bettey Costa APPERSON. I was present in the clinic during the provisions of these services with no other clinical duties. I concur with the management plan as noted above.  -------------------------------------------

## 2010-09-25 LAB — GLUCOSE POC, HMC: Glucose (POC): 134 mg/dL — ABNORMAL HIGH (ref 62–125)

## 2010-09-25 NOTE — Progress Notes (Signed)
Nutrition Note    Clinic:  Adult Medicine    Note Type: Reassessment    Referred by: Dr Kimberlee Nearing  Same day verbal order read back: No  Date referred: 03/2008    Reason For Visit: Weight management    An interpreter was not needed for the visit.    Assessment    Medical Diagnosis: Obesity, Hypertension, Hyperlipidemia, Esophogeal Reflux and Developmental Delay, Schizophrenia    Wt 203 lb 12.8 oz (92.443 kg)  Ht 5\' 3"  (1.6 m)  Body mass index is 36.10 kg/(m^2).    Weight History:     Vitals 07/20/2010   WEIGHT 203 lb     Initial wt: 189.4lb (04/2008)    Adult BMI Classification: obese categ. II (35- 39.9)    Labs:  DIABETES 07/20/2010   A1c 8.7 (H)      A1c 6.1 (02/27/09)  Self monitored blood glucose: No    Current Outpatient Prescriptions   Medication Sig   . Albuterol 90 MCG/ACT Inhalation Aero Soln Inhale 2 puffs every 4 to 6 hours as needed HFA substitution is authorized if albuterol is not available.   . Divalproex Sodium 500 MG Oral Tab EC take 1.5 pills qhs, [facility]   . Fluticasone Propionate  HFA (FLOVENT HFA) 220 MCG/ACT Inhalation Aerosol None Entered   . Hydrochlorothiazide 25 MG Oral Tab 1 TABLET DAILY   . Lisinopril 10 MG Oral Tab Take 2 tablets by mouth daily   . Loratadine 10 MG Oral Cap None Entered   . MetFORMIN HCl 1000 MG Oral Tab Take 1 tablet by mouth 2 times a day   . Ranitidine HCl 150 MG Oral Tab 1 TABLET AT BEDTIME   . RISPERIDONE OR 50mg  injection--by [facility]   . Simvastatin 10 MG Oral Tab 1 TABLET AT BEDTIME   . Skin Protectants, Misc. (EUCERIN) External Cream Apply to affected area(s) 1 to 2 times a day for dry skin   Ranitidine, Simvastatin, Risperdal    Vitamins/Minerals/Herbal Supplements: not discussed    Nutrition requirements: not discussed    Barriers to adequate intake: None    GI tolerance: None      History   Substance Use Topics   . Smoking status: Never Smoker    . Smokeless tobacco: Never Used   . Alcohol Use: No     Activity: Pt willing to walk  more- currently only on Tuesdays.  He says that he plays basketball on Tuesday w/ group?  Also plans to play t-ball on mondays and thursdays    Special/alternative diet: None    Food intolerances/allergies: None    Diet History: Seann has care giver who is preparing meals on Tuesdays and Fridays.  He also works with someone from a diabetes program (?).  Her name is Marcelino Duster and she has been helping him with improving lifestyle management of diabetes.   Nazareth was recently in [place] for the [employer] and admits his diet was not that healthy.  He has a couple more trips planned this summer.    He would like another copy of the grocery list, restaurant meals and menu for home    Information from Patient: none    Assessment Summary: weight stable this past 2 months.  His A1c has increased suspect [place] eating too much carbohydrate but also ? If he is taking metformin as recommended since this has been an issue in the past.    Nutrition Diagnosis: Inability to manage self care  related to develomental delay evidenced by difficulty sticking to recommendations.    Diagnosis Reassessment:  _ Resolved (nutrition problem no longer exists)  _ Improvement shown (Nutrition problem still exists).  Payam benefits from continued support and education  _ Unresolved no improvement shown  _ No longer appropriate (change in condition)    Interventions: Reviewed meal ideas and gave him more copies of meal ideas at home and restaurant and grocery list.  Encouraged him to work on goal of walking more    Education Provided:  Please refer to Education section of chart     Patient stated goals: Weight loss; healthy eating    Nutrition topics taught: Weight Management::  Healthy choices and portion control    Education materials provided: YES:  home and restaurant meal ideas (w/ portions), grocery lists    Time spent teaching: 10 mins    Plan/Recommendations    Follow up: In 1 month(s)    Plan: Use meal plans and grocery lists.      Total  Time Spent:  45 mins    [name], MS, RD, CD, CDE  [facility]  [address] [phone]  [address] [place], [place] [address]  Pager: [phone]

## 2010-10-05 ENCOUNTER — Encounter (HOSPITAL_BASED_OUTPATIENT_CLINIC_OR_DEPARTMENT_OTHER): Payer: Medicaid Other

## 2010-10-22 ENCOUNTER — Encounter (HOSPITAL_BASED_OUTPATIENT_CLINIC_OR_DEPARTMENT_OTHER): Payer: Medicaid Other

## 2010-10-26 ENCOUNTER — Encounter (HOSPITAL_BASED_OUTPATIENT_CLINIC_OR_DEPARTMENT_OTHER): Payer: Medicaid Other

## 2010-10-29 ENCOUNTER — Encounter (HOSPITAL_BASED_OUTPATIENT_CLINIC_OR_DEPARTMENT_OTHER): Payer: Medicaid Other

## 2010-11-02 ENCOUNTER — Encounter (HOSPITAL_BASED_OUTPATIENT_CLINIC_OR_DEPARTMENT_OTHER): Payer: Medicaid Other

## 2010-11-05 ENCOUNTER — Ambulatory Visit (HOSPITAL_BASED_OUTPATIENT_CLINIC_OR_DEPARTMENT_OTHER): Payer: Medicaid Other | Attending: Internal Medicine | Admitting: Registered"

## 2010-11-05 ENCOUNTER — Ambulatory Visit (HOSPITAL_BASED_OUTPATIENT_CLINIC_OR_DEPARTMENT_OTHER): Payer: Medicaid Other

## 2010-11-05 VITALS — BP 155/57 | HR 74 | Ht 63.0 in | Wt 200.4 lb

## 2010-11-05 DIAGNOSIS — E119 Type 2 diabetes mellitus without complications: Secondary | ICD-10-CM | POA: Insufficient documentation

## 2010-11-05 NOTE — Progress Notes (Signed)
Diagnosed:January 2012  Eye exam (annually): ? Outside provider  Foot exam: (annually if neuropathy absent, every visit otherwise)- 04/20/10 risk category 0  Pneumo vaccine : 03/12/2002  Flu shot (annually): 04/11/10  HGBA1c (every 6 months):A1C      8.7   07/20/2010  LDL (every 6 months): LDL       84   07/20/2010  Alb/Cr ratio goal <30 (annually) : MICROALBUMIN      8.8   07/20/2010  Creatinine: CREATININE     0.97   04/16/2010      ID  Visit type: Appointed  Interpreter present: None  Referral type:  Referred by [name]    SUBJECTIVE  Justin Zavala is a 45 year old male  here today to see nurse for follow-up re diabetes.     LIFESTYLE  -Diet: Met with dietician today prior to my appointment; reports his mother would only let him have one piece of cake at a recent family reunion. He goes out to eat once a month with his case Production designer, theatre/television/film.    -Exercise:  Reports he walks once a day; does some bowling; and participated in [employer] earlier this month. He was able to tell me that exercise will lower his glucose.     Other:  He takes metformin twice a day once in the morning and once at night.  Denies any problems taking his medication.     DIABETES INFORMATION  -Home Glucometer Reading: does not monitor  -Diabetic Foot Exam Risk Category: Not performed today    ALLERGIES  Review of patient's allergies indicates:  No Known Allergies        ASSESSMENT/PLAN       I will see him again in a month when he sees the dietician.     -Medication Management: No change to medication    MEDICATIONS  Current Outpatient Prescriptions   Medication Sig   . Albuterol 90 MCG/ACT Inhalation Aero Soln Inhale 2 puffs every 4 to 6 hours as needed HFA substitution is authorized if albuterol is not available.   . Divalproex Sodium 500 MG Oral Tab EC take 1.5 pills qhs, [facility]   . Fluticasone Propionate  HFA (FLOVENT HFA) 220 MCG/ACT Inhalation Aerosol None Entered   . Hydrochlorothiazide 25 MG Oral Tab 1 TABLET DAILY   .  Lisinopril 10 MG Oral Tab Take 2 tablets by mouth daily   . Loratadine 10 MG Oral Cap None Entered   . MetFORMIN HCl 1000 MG Oral Tab Take 1 tablet by mouth 2 times a day   . Ranitidine HCl 150 MG Oral Tab 1 TABLET AT BEDTIME   . RISPERIDONE OR 50mg  injection--by [facility]   . Simvastatin 10 MG Oral Tab 1 TABLET AT BEDTIME   . Skin Protectants, Misc. (EUCERIN) External Cream Apply to affected area(s) 1 to 2 times a day for dry skin         EDUCATION  -Primary learner(s): Patient  -Patient challenges to learning: Cognitive and Emotional   -Challenges impacting this teaching session: Cognitive and Emotional   -Education materials provided: None  -Post education response: states understanding    Topics Discussed:  Diabetes  Diabetes discussion topics included:-Balance of diet, meds, and activity  Glucometer education: Did not discuss    Nutrition  Nutritional discussion topics included: -Portion size    Exercise  Exercise discussion topics included: -Importance of exercise    Medication  Medication education discussion topics included: -Importance of  taking them as ordered    TIME [id number]  -Total time spent teaching: 15 minutes  -Total visit time: 20 minutes

## 2010-11-08 NOTE — Progress Notes (Signed)
Nutrition Note    Clinic:  Adult Medicine    Note Type: Reassessment    Referred by: Dr Kimberlee Nearing  Same day verbal order read back: No  Date referred: 03/2008    Reason For Visit: Weight management    An interpreter was not needed for the visit.    Assessment    Medical Diagnosis: Obesity, Hypertension, Hyperlipidemia, Esophogeal Reflux and Developmental Delay, Schizophrenia    Vitals 11/05/2010   WEIGHT 200 lb 6.4 oz   HEIGHT 5\' 3"    BODY MASS INDEX 35.51     Weight History:     Vitals 09/24/2010   WEIGHT 203 lb 12.8 oz   HEIGHT 5\' 3"    BODY MASS INDEX 36.11     Initial wt: 189.4lb (04/2008)    Adult BMI Classification: obese categ. II (35- 39.9)    Labs:  DIABETES 07/20/2010   A1c 8.7 (H)      A1c 6.1 (02/27/09)  Self monitored blood glucose: No    Current Outpatient Prescriptions   Medication Sig   . Albuterol 90 MCG/ACT Inhalation Aero Soln Inhale 2 puffs every 4 to 6 hours as needed HFA substitution is authorized if albuterol is not available.   . Divalproex Sodium 500 MG Oral Tab EC take 1.5 pills qhs, FROM [facility]   . Fluticasone Propionate  HFA (FLOVENT HFA) 220 MCG/ACT Inhalation Aerosol None Entered   . Hydrochlorothiazide 25 MG Oral Tab 1 TABLET DAILY   . Lisinopril 10 MG Oral Tab Take 2 tablets by mouth daily   . Loratadine 10 MG Oral Cap None Entered   . MetFORMIN HCl 1000 MG Oral Tab Take 1 tablet by mouth 2 times a day   . Ranitidine HCl 150 MG Oral Tab 1 TABLET AT BEDTIME   . RISPERIDONE OR 50mg  injection--by [facility]   . Simvastatin 10 MG Oral Tab 1 TABLET AT BEDTIME   . Skin Protectants, Misc. (EUCERIN) External Cream Apply to affected area(s) 1 to 2 times a day for dry skin   Ranitidine, Simvastatin, Risperdal    Vitamins/Minerals/Herbal Supplements: not discussed    Nutrition requirements: not discussed    Barriers to adequate intake: None    GI tolerance: None      History   Substance Use Topics   . Smoking status: Never Smoker    . Smokeless tobacco: Never Used   . Alcohol  Use: No     Activity: Pt willing to walk more- currently only on Tuesdays.  T-ball finished up and he will be starting bowling soon. He also participated in [employer] recently    Special/alternative diet: None    Food intolerances/allergies: None    Diet History: Zorion has care giver who is preparing meals on Tuesdays and Fridays.  He continues to work w/ Marcelino Duster DiMiscio [phone]) from [employer].  She continues to help him with improving lifestyle management of diabetes.  He also states he is taking cooking classes at the [facility].    Information from Patient: none    Assessment Summary: 1.5lb weight loss in past month.      Nutrition Diagnosis: Inability to manage self care  related to develomental delay evidenced by difficulty sticking to recommendations.    Diagnosis Reassessment:  _ Resolved (nutrition problem no longer exists)  _ Improvement shown (Nutrition problem still exists).  Pranshu benefits from continued support and education  _ Unresolved no improvement shown  _ No longer appropriate (change in condition)  Interventions: Encouraged him to continue to work on choosing healthier foods and preparation methods    Education Provided:  Please refer to Education section of chart     Patient stated goals: Weight loss; healthy eating    Nutrition topics taught: Weight Management::  Healthy choices and portion control    Education materials provided: YES:  home and restaurant meal ideas (w/ portions), grocery lists    Time spent teaching: 5 mins    Plan/Recommendations    Follow up: In 1 month(s)    Plan: none    Total Time Spent:  30 mins    [name], MS, RD, CD, CDE  [facility] Dietitian  [phone]  [address], [place], [place] [address]  Pager: [phone]

## 2010-11-26 ENCOUNTER — Other Ambulatory Visit: Payer: Self-pay | Admitting: Internal Medicine

## 2010-11-26 MED ORDER — SIMVASTATIN 10 MG OR TABS
ORAL_TABLET | ORAL | Status: DC
Start: 2010-11-26 — End: 2011-04-22

## 2010-11-26 MED ORDER — RANITIDINE HCL 150 MG OR TABS
ORAL_TABLET | ORAL | Status: DC
Start: 2010-11-26 — End: 2011-01-22

## 2010-12-03 ENCOUNTER — Encounter (HOSPITAL_BASED_OUTPATIENT_CLINIC_OR_DEPARTMENT_OTHER): Payer: Medicaid Other

## 2010-12-03 ENCOUNTER — Encounter (HOSPITAL_BASED_OUTPATIENT_CLINIC_OR_DEPARTMENT_OTHER): Payer: Medicaid Other | Admitting: Registered"

## 2010-12-14 ENCOUNTER — Ambulatory Visit (HOSPITAL_BASED_OUTPATIENT_CLINIC_OR_DEPARTMENT_OTHER): Payer: Medicaid Other | Attending: Internal Medicine

## 2010-12-14 VITALS — BP 164/77 | HR 82 | Ht 63.0 in | Wt 208.0 lb

## 2010-12-14 DIAGNOSIS — E119 Type 2 diabetes mellitus without complications: Secondary | ICD-10-CM | POA: Insufficient documentation

## 2010-12-14 NOTE — Progress Notes (Signed)
Diagnosed:January 2012  Eye exam (annually): ? Outside provider  Foot exam: (annually if neuropathy absent, every visit otherwise)- 04/20/10 risk category 0  Pneumo vaccine : 03/12/2002  Flu shot (annually): 04/11/10  HGBA1c (every 6 months):A1C      8.7   07/20/2010  LDL (every 6 months): LDL       84   07/20/2010  Alb/Cr ratio goal <30 (annually) : MICROALBUMIN      8.8   07/20/2010  Creatinine: CREATININE     0.97   04/16/2010      ID  Visit type: Appointed  Interpreter present: None  Referral type:  Referred by [name]    SUBJECTIVE  Justin Zavala is a 45 year old male  here today to see nurse for follow-up re diabetes.     LIFESTYLE  -Diet:  His care giver cooks for him three days a week.  He says they are trying to decrease desserts.    -Exercise:  Reports he walks 3-4 days a week; goes bowling once a week; and will be going with a group to the [place] [employer] [employer] later this month.     Other:  He takes metformin twice a day once in the morning and once at night.  Denies any problems taking his medication.     DIABETES INFORMATION  -Home Glucometer Reading: does not monitor  -Diabetic Foot Exam Risk Category: Not performed today    ALLERGIES  Review of patient's allergies indicates:  No Known Allergies        ASSESSMENT/PLAN       He has gained 8 pounds in a little over a month.  He says he will make a bigger effort to eat smaller portions.  We also talked about the [place].   I will see him again in a month when he sees the dietician.     -Medication Management: No change to medication    MEDICATIONS  Current Outpatient Prescriptions   Medication Sig   . Albuterol 90 MCG/ACT Inhalation Aero Soln Inhale 2 puffs every 4 to 6 hours as needed HFA substitution is authorized if albuterol is not available.   . Divalproex Sodium 500 MG Oral Tab EC take 1.5 pills qhs, [facility]   . Fluticasone Propionate  HFA (FLOVENT HFA) 220 MCG/ACT Inhalation Aerosol None Entered   .  Hydrochlorothiazide 25 MG Oral Tab 1 TABLET DAILY   . Lisinopril 10 MG Oral Tab Take 2 tablets by mouth daily   . Loratadine 10 MG Oral Cap None Entered   . MetFORMIN HCl 1000 MG Oral Tab Take 1 tablet by mouth 2 times a day   . Ranitidine HCl 150 MG Oral Tab 1 TABLET TWICE DAILY   . RISPERIDONE OR 50mg  injection--by [facility]   . Simvastatin 10 MG Oral Tab 1 TABLET AT BEDTIME   . Skin Protectants, Misc. (EUCERIN) External Cream Apply to affected area(s) 1 to 2 times a day for dry skin         EDUCATION  -Primary learner(s): Patient  -Patient challenges to learning: Cognitive and Emotional   -Challenges impacting this teaching session: Cognitive and Emotional   -Education materials provided: None  -Post education response: states understanding    Topics Discussed:  Diabetes  Diabetes discussion topics included:-Balance of diet, meds, and activity  Glucometer education: Did not discuss    Nutrition  Nutritional discussion topics included: -Portion size    Exercise  Exercise discussion topics included: -Importance of exercise    Medication  Medication education discussion topics included: -Importance of taking them as ordered    TIME SPENT15  -Total time spent teaching: 15 minutes  -Total visit time: 20 minutes

## 2010-12-24 ENCOUNTER — Encounter (HOSPITAL_BASED_OUTPATIENT_CLINIC_OR_DEPARTMENT_OTHER): Payer: Medicaid Other

## 2010-12-24 ENCOUNTER — Other Ambulatory Visit: Payer: Self-pay | Admitting: Pharmacist

## 2010-12-24 MED ORDER — HYDROCHLOROTHIAZIDE 25 MG OR TABS
ORAL_TABLET | ORAL | Status: DC
Start: 2010-12-24 — End: 2011-01-22

## 2010-12-24 MED ORDER — LORATADINE 10 MG OR TABS
ORAL_TABLET | ORAL | Status: DC
Start: 2010-12-24 — End: 2011-04-22

## 2010-12-24 NOTE — Telephone Encounter (Signed)
Next visit = 01/04/11    Last labs = 04/16/10

## 2011-01-04 ENCOUNTER — Ambulatory Visit (HOSPITAL_BASED_OUTPATIENT_CLINIC_OR_DEPARTMENT_OTHER): Payer: Medicaid Other | Attending: Internal Medicine | Admitting: Internal Medicine

## 2011-01-04 ENCOUNTER — Encounter (HOSPITAL_BASED_OUTPATIENT_CLINIC_OR_DEPARTMENT_OTHER): Payer: Self-pay | Admitting: Internal Medicine

## 2011-01-04 ENCOUNTER — Other Ambulatory Visit (HOSPITAL_BASED_OUTPATIENT_CLINIC_OR_DEPARTMENT_OTHER): Payer: Self-pay | Admitting: Internal Medicine

## 2011-01-04 VITALS — BP 157/59 | HR 88 | Temp 97.3°F | Ht 63.0 in | Wt 206.2 lb

## 2011-01-04 DIAGNOSIS — IMO0001 Reserved for inherently not codable concepts without codable children: Secondary | ICD-10-CM | POA: Insufficient documentation

## 2011-01-04 MED ORDER — ALBUTEROL 90 MCG/ACT IN AERS
INHALATION_SPRAY | RESPIRATORY_TRACT | Status: DC
Start: 2011-01-04 — End: 2012-02-28

## 2011-01-04 MED ORDER — FLUTICASONE PROPIONATE HFA 220 MCG/ACT IN AERO
INHALATION_SPRAY | RESPIRATORY_TRACT | Status: DC
Start: 2011-01-04 — End: 2012-02-28

## 2011-01-04 MED ORDER — LISINOPRIL 30 MG OR TABS
ORAL_TABLET | ORAL | Status: DC
Start: 2011-01-04 — End: 2011-04-22

## 2011-01-04 NOTE — Patient Instructions (Signed)
Increase Lisinopril to 30mg  by mouth each day (Dr. Vernell Barrier will call [facility])    Return to lab Oct 15th for blood draw    Increase your exercise. Try to walk three times a week for 30 minutes.    Remember to eat healthy! Less brownies, french fries and cheeseburgers.

## 2011-01-04 NOTE — Progress Notes (Signed)
S/  45 year old man with developemental delay, asthma and DMII. Here for follow up.    Justin Zavala is here to monitor his DM and his weight. He reports that he has been 'cheating' some on his diet. One of his caregivers is making him brownies and another is letting him have cheeseburgers when they go out. Also still eating at [employer] some (only once this past month, though- an improvement). Also had cheeseburgers and fries at the [facility].     Otherwise when at home, he 'eats what the lady cooks.' He says he is going to start eating more vegetables, and still he is getting food from the food bank and doing ok with that. She comes Tuesdays and cooks for the whole week.     Still bowling on saturdays. Walking 1 hour after bowling also.     He reports that his asthma is 'fine,' but on further questioning, he says that he has been coughing at night, and that he is short of breath with walking. Also might be having some wheezing.  He reports he is out of his inhalers and hasn't been using them. He says his night time cough is 'everynight.'    BP still high today. BP rechecked by me is 150/56.    Wt Readings from Last 6 Encounters:   01/04/11 206 lb 3.2 oz (93.532 kg)   12/14/10 208 lb (94.348 kg)   11/05/10 200 lb 6.4 oz (90.901 kg)   09/24/10 200 lb 9.6 oz (90.992 kg)   09/24/10 203 lb 12.8 oz (92.443 kg)   07/20/10 203 lb (92.08 kg)     BP Readings from Last 5 Encounters:   01/04/11 157/59   12/14/10 164/77   11/05/10 155/57   09/24/10 147/51   07/20/10 127/61     O/  BP 157/59  Pulse 88  Temp(Src) 97.3 F (36.3 C) (Temporal)  Ht 5\' 3"  (1.6 m)  Wt 206 lb 3.2 oz (93.532 kg)  BMI 36.53 kg/m2  GEN: pleasant, NAD    A1c pending    Assessment/Plan:    DM: not well controlled  Last A1c in April 8.7, not at goal. Given his other comorbidities, would like to target <7 for him  Rechecked A1c today, will add glipizide if needed   Lipids OK, will keep him on simvistatin 10mg  PO qhs   CV: not on ASA currently. Can consider  if lipids look worse or we are unable to get control of DM   RENAL: Alb/cr ratio 8.8 in April 2012. On ACE   EYE: Goes to optho yearly in April. Sees outside provider. Will get records   Feet: normal exam 04/2010   Flu vaccine given 04/2010   pneumovax 2003     HTN: still not well controlled. Increased Lisinpril to 30 today, will recheck Cr and K on Oct 15th.     Allergy/asthma: called [facility], he has actually not been using inhalers in over a year. We refilled these today. Will check on this next time I see him.   Continue flovent/albuterol     Will call Luisa Hart to update him today   Return to clinic 3-4 months    Next visit:  Feet  Check in on asthma  Check if he increased his exercise  Check on his weight        Trellis Moment   [phone]    ____________________________________________________________________________________    Patient Active Problem List   Diagnoses Date Noted   . Asthma [  493.00] 01/04/2011   . DEVELOPMENT DELAYS NEC [315.8] 05/21/2010     case manager Jaclyn Prime, cell [phone] is his guardian. Should be called after appointments to update him on any changes  mother [name] can be reached at [phone]   sister Laurena Spies can be reached at work number [phone]       . DIABETES UNCOMPL ADULT-TYPE II [250.00] 04/20/2010       Meds reviewed today with [facility] over the phone:  Current outpatient prescriptions:Albuterol 90 MCG/ACT Inhalation Aero Soln, Inhale 2 puffs every 4 to 6 hours as needed HFA substitution is authorized if albuterol is not available., Disp: 1 Inhaler, Rfl: 6;  Divalproex Sodium 500 MG Oral Tab EC, take 1.5 pills qhs, [facility], Disp: , Rfl:   Fluticasone Propionate  HFA (FLOVENT HFA) 220 MCG/ACT Inhalation Aerosol, 1 puff twice a day EVERYDAY. Even if symptoms are no present, Disp: 1 Inhaler, Rfl: 12;  Hydrochlorothiazide 25 MG Oral Tab, Take 1 tablet by mouth once daily, Disp: 28 Tab, Rfl: 0;  Lisinopril 30 MG Oral Tab, take 1tab PO qday, Disp: 30 Tab, Rfl: 6;   Loratadine 10 MG Oral Tab, Take 1 tablet by mouth once daily, Disp: 28 Tab, Rfl: 6  MetFORMIN HCl 1000 MG Oral Tab, Take 1 tablet by mouth 2 times a day, Disp: 60 Tab, Rfl: 0;  Ranitidine HCl 150 MG Oral Tab, 1 TABLET TWICE DAILY, Disp: 56 Tab, Rfl: 1;  RISPERIDONE OR, 50mg  injection--by [facility], Disp: , Rfl: ;  Simvastatin 10 MG Oral Tab, 1 TABLET AT BEDTIME, Disp: 28 Tab, Rfl: 6;  Skin Protectants, Misc. (EUCERIN) External Cream, Apply to affected area(s) 1 to 2 times a day for dry skin, Disp: 454 g, Rfl: 0

## 2011-01-07 ENCOUNTER — Ambulatory Visit (HOSPITAL_BASED_OUTPATIENT_CLINIC_OR_DEPARTMENT_OTHER): Payer: Medicaid Other | Attending: Internal Medicine

## 2011-01-07 ENCOUNTER — Ambulatory Visit (HOSPITAL_BASED_OUTPATIENT_CLINIC_OR_DEPARTMENT_OTHER): Payer: Medicaid Other | Admitting: Registered"

## 2011-01-07 ENCOUNTER — Ambulatory Visit (HOSPITAL_BASED_OUTPATIENT_CLINIC_OR_DEPARTMENT_OTHER): Payer: Medicaid Other

## 2011-01-07 VITALS — BP 166/71 | HR 96 | Ht 63.0 in | Wt 209.6 lb

## 2011-01-07 DIAGNOSIS — E119 Type 2 diabetes mellitus without complications: Secondary | ICD-10-CM | POA: Insufficient documentation

## 2011-01-07 LAB — HEMOGLOBIN A1C, HPLC: Hemoglobin A1C: 6.6 % — ABNORMAL HIGH (ref 4.0–6.0)

## 2011-01-08 NOTE — Progress Notes (Signed)
Diagnosed:January 2012  Eye exam (annually): ? Outside provider  Foot exam: (annually if neuropathy absent, every visit otherwise)- 04/20/10 risk category 0  Pneumo vaccine : 03/12/2002  Flu shot (annually): 04/11/10  HGBA1c (every 6 months):A1C      6.6   01/04/2011  A1C   Significant change in lab result, rechecked by lab.   01/04/2011  LDL (every 6 months): LDL       84   07/20/2010  Alb/Cr ratio goal <30 (annually) : MICROALBUMIN      8.8   07/20/2010  Creatinine: CREATININE     0.97   04/16/2010      ID  Visit type: Appointed  Interpreter present: None  Referral type:  Referred by [name]    SUBJECTIVE  Justin Zavala is a 45 year old male  here today to see nurse for follow-up re diabetes.     LIFESTYLE  -Diet:  He continues to try to limit desserts and plans to buy more fish to eat.     -Exercise:  Reports he walks 3-4 days a week; goes bowling once a week;  The bowling will end in November.     Other:  He takes metformin twice a day once in the morning and once at night.  Denies any problems taking his medication.     DIABETES INFORMATION  -Home Glucometer Reading: does not monitor  -Diabetic Foot Exam Risk Category: Not performed today    ALLERGIES  Review of patient's allergies indicates:  No Known Allergies        ASSESSMENT/PLAN         I encouraged him to continue to try to limit portion sizes and stay as active as possible. I will see him again in a month.        -Medication Management: No change to medication    MEDICATIONS  Current Outpatient Prescriptions   Medication Sig   . Albuterol 90 MCG/ACT Inhalation Aero Soln Inhale 2 puffs every 4 to 6 hours as needed HFA substitution is authorized if albuterol is not available.   . Divalproex Sodium 500 MG Oral Tab EC take 1.5 pills qhs, [facility]   . Fluticasone Propionate  HFA (FLOVENT HFA) 220 MCG/ACT Inhalation Aerosol 1 puff twice a day EVERYDAY. Even if symptoms are no present   . Hydrochlorothiazide 25 MG Oral Tab Take 1 tablet by  mouth once daily   . Lisinopril 30 MG Oral Tab take 1tab PO qday   . Loratadine 10 MG Oral Tab Take 1 tablet by mouth once daily   . MetFORMIN HCl 1000 MG Oral Tab Take 1 tablet by mouth 2 times a day   . Ranitidine HCl 150 MG Oral Tab 1 TABLET TWICE DAILY   . RISPERIDONE OR 50mg  injection--by [facility]   . Simvastatin 10 MG Oral Tab 1 TABLET AT BEDTIME   . Skin Protectants, Misc. (EUCERIN) External Cream Apply to affected area(s) 1 to 2 times a day for dry skin         EDUCATION  -Primary learner(s): Patient  -Patient challenges to learning: Cognitive and Emotional   -Challenges impacting this teaching session: Cognitive and Emotional   -Education materials provided: None  -Post education response: states understanding    Topics Discussed:  Diabetes  Diabetes discussion topics included:-Balance of diet, meds, and activity  Glucometer education: Did not discuss    Nutrition  Nutritional discussion topics included: -Portion size    Exercise  Exercise discussion topics included: -Importance of exercise    Medication  Medication education discussion topics included: -Importance of taking them as ordered    TIME SPENT15  -Total time spent teaching: 15 minutes  -Total visit time: 20 minutes

## 2011-01-14 ENCOUNTER — Ambulatory Visit (HOSPITAL_BASED_OUTPATIENT_CLINIC_OR_DEPARTMENT_OTHER): Payer: Medicaid Other | Admitting: Registered"

## 2011-01-14 VITALS — Ht 63.0 in | Wt 212.6 lb

## 2011-01-14 NOTE — Progress Notes (Signed)
Nutrition Note    Clinic:  Adult Medicine    Note Type: Reassessment    Referred by: Dr Kimberlee Nearing  Same day verbal order read back: No  Date referred: 03/2008    Reason For Visit: Weight management    An interpreter was not needed for the visit.    Assessment    Medical Diagnosis: Obesity, Hypertension, Hyperlipidemia, Esophogeal Reflux and Developmental Delay, Schizophrenia    Vitals WEIGHT HEIGHT BODY MASS INDEX   01/14/2011 212 lb 9.6 oz 5\' 3"  37.67     Weight History:   200 lb 6.4 oz (11/05/10)    Initial wt: 189.4lb (04/2008)    Adult BMI Classification: obese categ. II (35- 39.9)    Labs:  DIABETES 07/20/2010   A1c 8.7 (H)      A1c 6.1 (02/27/09)  Self monitored blood glucose: does not monitor    Current Outpatient Prescriptions   Medication Sig   . Albuterol 90 MCG/ACT Inhalation Aero Soln Inhale 2 puffs every 4 to 6 hours as needed HFA substitution is authorized if albuterol is not available.   . Divalproex Sodium 500 MG Oral Tab EC take 1.5 pills qhs, [facility]   . Fluticasone Propionate  HFA (FLOVENT HFA) 220 MCG/ACT Inhalation Aerosol 1 puff twice a day EVERYDAY. Even if symptoms are no present   . Hydrochlorothiazide 25 MG Oral Tab Take 1 tablet by mouth once daily   . Lisinopril 30 MG Oral Tab take 1tab PO qday   . Loratadine 10 MG Oral Tab Take 1 tablet by mouth once daily   . MetFORMIN HCl 1000 MG Oral Tab Take 1 tablet by mouth 2 times a day   . Ranitidine HCl 150 MG Oral Tab 1 TABLET TWICE DAILY   . RISPERIDONE OR 50mg  injection--by [facility]   . Simvastatin 10 MG Oral Tab 1 TABLET AT BEDTIME   . Skin Protectants, Misc. (EUCERIN) External Cream Apply to affected area(s) 1 to 2 times a day for dry skin   Ranitidine, Simvastatin, Risperdal    Vitamins/Minerals/Herbal Supplements: not discussed    Nutrition requirements: not discussed    Barriers to adequate intake: None    GI tolerance: None      History   Substance Use Topics   . Smoking status: Never Smoker    . Smokeless  tobacco: Never Used   . Alcohol Use: No     Activity: Justin Zavala walks to the bus most days but states he can walk for exercise every day.  He also bowls on Saturdays and will continue with this activity until January.  He will start basketball in December.    Special/alternative diet: None    Food intolerances/allergies: None    Diet History: Justin Zavala has care giver who is preparing meals on Mondays, Thursdays, and Fridays.  His sister is cooking dinner for him on Tuesday, Wednesday, and Sundays.  He usually goes out on Saturdays.  This Saturday he is going out with Justin Zavala his case Production designer, theatre/television/film.  They are going to [employer] because there is a benefit dinner and he is planning to get a chicken burger vs beef burger (based on his doctor's recommendation.  He thought that sweet potato fries would be a healthier option than regular fries.  He continues to work w/ Justin Zavala [phone]) from [facility] and they are going grocery shopping at [facility] this week.     Information from Patient: none    Assessment Summary: 12lb weight  gain in past 3 months      Nutrition Diagnosis: Inability to manage self care  related to develomental delay evidenced by difficulty sticking to recommendations.    Diagnosis Reassessment:  _ Resolved (nutrition problem no longer exists)  _ Improvement shown (Nutrition problem still exists).    _x Unresolved no improvement shown:  12lb weight gain and poorly controlled diabetes d/t  difficulty with self-management 2/2 developmental delay.  Justin Zavala benefits from continued support and education  _ No longer appropriate (change in condition)    Interventions: reviewed healthier options at restaurants- suggested choosing side salad vs fries (regular or sweet potato); gave him list of healthy meal ideas for home    Education Provided:  Please refer to Education section of chart     Patient stated goals: Weight loss; healthy eating    Nutrition topics taught: Weight Management::  Healthy choices and  portion control    Education materials provided: YES:  home and restaurant meal ideas (w/ portions)    Time spent teaching: 5 mins    Plan/Recommendations    Follow up: In 1 month(s)    Plan: choose side salad instead of fries; walk every day    Total Time Spent:  30 mins    [name], MS, RD, CD, CDE  [facility] Care Dietitian  [phone]  [address], [place], [place] [address]  Pager: [phone]

## 2011-01-21 ENCOUNTER — Encounter (HOSPITAL_BASED_OUTPATIENT_CLINIC_OR_DEPARTMENT_OTHER): Payer: Medicaid Other | Admitting: Internal Medicine

## 2011-01-22 ENCOUNTER — Other Ambulatory Visit: Payer: Self-pay | Admitting: Pharmacist

## 2011-01-22 MED ORDER — RANITIDINE HCL 150 MG OR TABS
ORAL_TABLET | ORAL | Status: DC
Start: 2011-01-22 — End: 2011-04-22

## 2011-01-22 MED ORDER — HYDROCHLOROTHIAZIDE 25 MG OR TABS
ORAL_TABLET | ORAL | Status: DC
Start: 2011-01-22 — End: 2011-04-22

## 2011-01-22 NOTE — Telephone Encounter (Signed)
Addended by: Shellia Carwin on: 01/22/2011 11:01 AM     Modules accepted: Orders

## 2011-01-22 NOTE — Telephone Encounter (Signed)
Next visit = 02/11/11

## 2011-02-04 ENCOUNTER — Encounter (HOSPITAL_BASED_OUTPATIENT_CLINIC_OR_DEPARTMENT_OTHER): Payer: Medicaid Other

## 2011-02-11 ENCOUNTER — Ambulatory Visit (HOSPITAL_BASED_OUTPATIENT_CLINIC_OR_DEPARTMENT_OTHER): Payer: No Typology Code available for payment source | Attending: Internal Medicine | Admitting: Internal Medicine

## 2011-02-11 ENCOUNTER — Encounter (HOSPITAL_BASED_OUTPATIENT_CLINIC_OR_DEPARTMENT_OTHER): Payer: Self-pay | Admitting: Internal Medicine

## 2011-02-11 VITALS — BP 158/76 | HR 92 | Temp 97.7°F | Resp 16 | Wt 205.2 lb

## 2011-02-11 DIAGNOSIS — F88 Other disorders of psychological development: Secondary | ICD-10-CM | POA: Insufficient documentation

## 2011-02-11 DIAGNOSIS — Z5181 Encounter for therapeutic drug level monitoring: Secondary | ICD-10-CM | POA: Insufficient documentation

## 2011-02-11 DIAGNOSIS — I10 Essential (primary) hypertension: Secondary | ICD-10-CM | POA: Insufficient documentation

## 2011-02-11 DIAGNOSIS — Z23 Encounter for immunization: Secondary | ICD-10-CM | POA: Insufficient documentation

## 2011-02-11 DIAGNOSIS — E119 Type 2 diabetes mellitus without complications: Secondary | ICD-10-CM | POA: Insufficient documentation

## 2011-02-11 LAB — BASIC METABOLIC PANEL
Anion Gap: 9 (ref 3–11)
Calcium: 9.7 mg/dL (ref 8.9–10.2)
Carbon Dioxide, Total: 29 mEq/L (ref 22–32)
Chloride: 99 mEq/L (ref 98–108)
Creatinine: 0.99 mg/dL (ref 0.51–1.18)
GFR, Calc, African American: >60 mL/min (ref >59)
GFR, Calc, European American: >60 mL/min (ref >59)
Glucose: 128 mg/dL — ABNORMAL HIGH (ref 62–125)
Potassium: 3.9 mEq/L (ref 3.7–5.2)
Sodium: 137 mEq/L (ref 136–145)
Urea Nitrogen: 6 mg/dL — ABNORMAL LOW (ref 8–21)

## 2011-02-11 MED ORDER — ASPIRIN 81 MG OR TBEC
DELAYED_RELEASE_TABLET | ORAL | Status: DC
Start: 2011-02-11 — End: 2012-02-05

## 2011-02-11 NOTE — Progress Notes (Addendum)
Outpatient Clinic  Note      DATE: 02/11/2011     Justin Zavala is a 45 year old male who is here today for follow up on DM and obesity. An interpreter was not needed for the visit.      INTERVAL HISTORY:  Reports he is exercising 1 hr every other day, walking to [facility] or [employer]. Also reports he has been eating better, drinking diet soda, though he recently had some pizza.Today he has a grocery bag with a large bag of cheetos in it, as well as neon cheeto cheese all over his shirt, so I am not sure how well he is actually doing with dietary compliance.     BP under better control today on recheck. Will continue to follow.     Using his albuterol 2x a day and reports no night time cough, Having only occasional wheeze after he walks, which he finds is helped by his albuterol when he uses it.    Reports his mother recently had a stroke.      My Goals for pt: weight loss    Wt Readings from Last 4 Encounters:   02/11/11 205 lb 3.2 oz (93.078 kg)   01/14/11 212 lb 9.6 oz (96.435 kg)   01/07/11 209 lb 9.6 oz (95.074 kg)   01/04/11 206 lb 3.2 oz (93.532 kg)     BP Readings from Last 4 Encounters:   02/11/11 158/76   01/07/11 166/71   01/04/11 157/59   12/14/10 164/77     Repeat BP in clinic 122/43    FAMILY HISTORY:  Mom recently had a stroke, has been at [place].     REVIEW OF SYSTEMS:  Negative except as above      PHYSICAL EXAM:  BP 158/76  Pulse 92  Temp 97.7 F (36.5 C)  Resp 16  SpO2 100%:, recheck BP better control  GEN: NAD  FEET: no fungal infection of skin, ?infection large toenail. 2+ radial and DP. Negative monofilament.       A1C      6.6   01/04/2011  A1C   Significant change in lab result, rechecked by lab.   01/04/2011  A1C      8.7   07/20/2010  A1C     10.4   04/16/2010      ASSESSMENT AND PLAN:  45 year old man with diabetes, developmental delay, obesity.        Type II Diabetes:  Hgb A1c (at least Q6 months): A1C      6.6   01/04/2011  A1C   Significant change in lab result,  rechecked by lab.   01/04/2011  A1C      8.7   07/20/2010  A1C     10.4   04/16/2010  Current Meds:  Metformin 1000 BID  Lipids(Q year): LDL       84   07/20/2010, on ASA  Proteinuria: MICROALBUMIN      8.8   07/20/2010, on lisinopril  Retinopathy: yearly at outside system.   Neuropathy/Footexam: normal 10/12      HTN: better control today, will try to keep pushing lisinopril as BP tolerates, but didn't want to make too many changes in one visit.  -Continue HCTZ, lisinopril  -ctm BP, try to increase ACE as BP tolerates for renal protection    CV: Aspirin: taking   Lipids: at goal, on statin   Glucose: +DM   AAA(m): n/a  CA: PSA/Mammogram: n/a  Colonoscopy: at 52  ID: HIV: low risk   Hepatitis:    Syphilis:   TB:   Tdap: 2011   Pvax: 2003   Flu: 11/12   Zoster:   Bone/Falls:  EOL:  IPV:        ALLERGIES:  Review of patient's allergies indicates no known allergies.    MEDICATIONS:  Current Outpatient Prescriptions   Medication Sig   . Albuterol 90 MCG/ACT Inhalation Aero Soln Inhale 2 puffs every 4 to 6 hours as needed HFA substitution is authorized if albuterol is not available.   . Divalproex Sodium 500 MG Oral Tab EC take 1.5 pills qhs, FROM [facility]   . Fluticasone Propionate  HFA (FLOVENT HFA) 220 MCG/ACT Inhalation Aerosol 1 puff twice a day EVERYDAY. Even if symptoms are no present   . Hydrochlorothiazide 25 MG Oral Tab Take 1 tablet by mouth once daily   . Lisinopril 30 MG Oral Tab take 1tab PO qday   . Loratadine 10 MG Oral Tab Take 1 tablet by mouth once daily   . MetFORMIN HCl 1000 MG Oral Tab Take 1 tablet by mouth 2 times a day   . Ranitidine HCl 150 MG Oral Tab Take 1 tablet by mouth twice a day   . RISPERIDONE OR 50mg  injection--by [facility]   . Simvastatin 10 MG Oral Tab 1 TABLET AT BEDTIME   . Skin Protectants, Misc. (EUCERIN) External Cream Apply to affected area(s) 1 to 2 times a day for dry skin       PROBLEM LIST:  Patient Active Problem List   Diagnoses Date Noted   . Asthma  [493.00] 01/04/2011   . DEVELOPMENT DELAYS NEC [315.8] 05/21/2010     case manager Jaclyn Prime, cell [phone] is his guardian. Should be called after appointments to update him on any changes  mother [name] can be reached at [phone]   sister Laurena Spies can be reached at work number [phone]       . DIABETES UNCOMPL ADULT-TYPE II [250.00] 04/20/2010       Attending Note  I discussed this patient's history and physical findings with Dellis Anes, MD, as well as the plan for this patient. I have reviewed and confirm the findings and plan as documented in this note.

## 2011-02-11 NOTE — Progress Notes (Signed)
VACCINE SCREENING/ORDER WHEN CONTRAINDICATIONS PRESENT    Order date: 02/11/2011  Ordering provider: Vernell Barrier  Clinic stock used: YES   Interpreter used?: None  Vaccine information sheet(s) discussed, patient/parent/guardian verbalized understanding? YES   Vaccines given: Fluzone    SCREENING: INACTIVATED VACCINES  NO 1. Do you have a high fever, severe cold-like symptoms or serious infection?  NO 2. Do you have any food allergies such as eggs, chicken, chicken feathers, chicken dander, or gelatin?  NO 3. Do you have any allergies to neomycin, streptomycin, or polymyxin?   NO 4. Have you had any severe reaction to a vaccine in the past such as a seizure, a convulsion from a high fever, or a paralysis problem called Guillain-Barre Syndrome? If so, which vaccine(s):         If the patient/parent/or guardian answered "yes" to any of the above questions, consult with the provider to review the responses prior to administering vaccination. Parents with a contraindication to immunization will not receive the immunization without a specific physician order to vaccinate the patient and discussion of risk and benefits related to the contraindication.     Physician Order for Administration of Vaccine(s) When Contraindications Are Present  I have reviewed the existence in this patient's health history of possible contraindication(s) to administration of vaccine. The benefits to this patient outweigh the potential risks of immunization. Administer vaccine(s):  Patsy Baltimore, 02/11/2011 2:39 PM

## 2011-02-11 NOTE — Patient Instructions (Addendum)
Keep exercising. 30 minutes of walking a day.  Continue to limit cheetos, pizza, fast food  Diet soda only    Start Aspirin 81mg  a day   Come to the lab before your next visit.

## 2011-02-25 ENCOUNTER — Ambulatory Visit (HOSPITAL_BASED_OUTPATIENT_CLINIC_OR_DEPARTMENT_OTHER): Payer: No Typology Code available for payment source | Admitting: Registered"

## 2011-02-25 VITALS — Ht 63.0 in | Wt 207.7 lb

## 2011-02-25 NOTE — Progress Notes (Signed)
Nutrition Note    Clinic:  Adult Medicine    Note Type: Reassessment    Referred by: Dr Kimberlee Nearing  Same day verbal order read back: No  Date referred: 03/2008    Reason For Visit: Weight management    An interpreter was not needed for the visit.    Assessment    Medical Diagnosis: Obesity, Hypertension, Hyperlipidemia, Esophogeal Reflux and Developmental Delay, Schizophrenia    Vitals Weight Height BMI (kg/m2)   02/25/2011 207 lb 11.2 oz 5\' 3"  36.79     Adult BMI Classification: obese categ. II (35- 39.9)    Weight History:     Vitals WEIGHT HEIGHT BODY MASS INDEX   01/14/2011 212 lb 9.6 oz 5\' 3"  37.67     Initial wt: 189.4lb (04/2008)    Labs:  DIABETES A1c   01/04/2011 6.6 (H)     DIABETES 07/20/2010   A1c 8.7 (H)      Self monitored blood glucose: does not monitor    Current Outpatient Prescriptions   Medication Sig   . Albuterol 90 MCG/ACT Inhalation Aero Soln Inhale 2 puffs every 4 to 6 hours as needed HFA substitution is authorized if albuterol is not available.   . Aspirin 81 MG Oral Tab EC Take 1 tablet by mouth daily   . Divalproex Sodium 500 MG Oral Tab EC take 1.5 pills qhs, [facility]   . Fluticasone Propionate  HFA (FLOVENT HFA) 220 MCG/ACT Inhalation Aerosol 1 puff twice a day EVERYDAY. Even if symptoms are no present   . Hydrochlorothiazide 25 MG Oral Tab Take 1 tablet by mouth once daily   . Lisinopril 30 MG Oral Tab take 1tab PO qday   . Loratadine 10 MG Oral Tab Take 1 tablet by mouth once daily   . MetFORMIN HCl 1000 MG Oral Tab Take 1 tablet by mouth 2 times a day   . Ranitidine HCl 150 MG Oral Tab Take 1 tablet by mouth twice a day   . RISPERIDONE OR 50mg  injection--by [facility]   . Simvastatin 10 MG Oral Tab 1 TABLET AT BEDTIME   . Skin Protectants, Misc. (EUCERIN) External Cream Apply to affected area(s) 1 to 2 times a day for dry skin   Ranitidine, Simvastatin, Risperdal    Vitamins/Minerals/Herbal Supplements: not discussed    Nutrition requirements: not  discussed    Barriers to adequate intake: None    GI tolerance: None    History   Substance Use Topics   . Smoking status: Never Smoker    . Smokeless tobacco: Never Used   . Alcohol Use: No     Activity: Justin Zavala reports that he is walking to [facility] every day and that this activity takes him ~30 minutes.  He also bowls on Saturdays and will continue with this activity until January.  He will start basketball in December and will play every Sunday.    Special/alternative diet: None    Food intolerances/allergies: None    Diet History: Justin Zavala has care giver who is preparing meals for him and he says his sister no longer prepares any meals.  He has a bag of chips with him but states he is only getting a treat once a month.  He says his girlfriend bought these chips for him because he did not buy any treats at the grocery store.  Justin Duster is meeting him at the grocery store to shop and he is using the lists I've given him at previous visits.  He also says he is only going out to eat 1x/wk with Justin Zavala and will also go to [employer] on Tuesday by himself.  He and Justin Zavala have gone to [employer] where Justin Zavala says he only ate 1/2 his meal and brought the rest home and [employer] where he had a grilled chicken burger.  When he goes to [employer] on his own he says he gets salad w/ grilled chicken  B: 2 pkts oatmeal (down from 4)  L: pbj sandwich or leftovers  D: last night had 3 egg omelet w/ mushrooms  Beverages: zero calorie water or diet coke    Information from Patient: none    Assessment Summary: 5lb weight loss in past 6 weeks     Nutrition Diagnosis: Inability to manage self care  related to develomental delay evidenced by difficulty sticking to recommendations.    Diagnosis Reassessment:  _ Resolved (nutrition problem no longer exists)  x_ Improvement shown (Nutrition problem still exists).  5lb weight loss and Justin Zavala able to state healthy habits.  I'm not convinced he is always doing what he says and that he maybe just knows what to say.   However, he has lost weight and seems to be benefiting from assistance of Justin Zavala [phone]) from local Diabetes Program.  D/t poor cognitive abilities, Justin Zavala does benefit from continued support and education  _ Unresolved no improvement shown:    _ No longer appropriate (change in condition)    Interventions: reviewed healthier options at restaurants- suggested choosing side salad vs fries (regular or sweet potato); gave him list of healthy meal ideas for home    Education Provided:  Please refer to Education section of chart     Patient stated goals: Weight loss; healthy eating    Nutrition topics taught: Weight Management::  Healthy choices and portion control    Education materials provided: no    Time spent teaching:15 mins    Plan/Recommendations    Follow up: same day as Justin Zavala 03/18/11    Plan: continue to choose zero calorie drinks and limit trips to fast food restaurants and treats such as chips.      Total Time Spent:    [name], MS, RD, CD, CDE  [facility] Diet[facility]  [phone]  [address], [place], [place] [address]  Pager: [phone]

## 2011-03-04 ENCOUNTER — Encounter (HOSPITAL_BASED_OUTPATIENT_CLINIC_OR_DEPARTMENT_OTHER): Payer: Medicaid Other

## 2011-03-15 ENCOUNTER — Telehealth (HOSPITAL_BASED_OUTPATIENT_CLINIC_OR_DEPARTMENT_OTHER): Payer: Self-pay | Admitting: Registered Nurse

## 2011-03-15 NOTE — Telephone Encounter (Signed)
Dr. Vernell Barrier, see below message. Please place lab orders if appropriate.

## 2011-03-15 NOTE — Telephone Encounter (Signed)
Message copied by Baldemar Friday on Fri Mar 15, 2011 10:06 AM  ------       Message from: Cottie Banda       Created: Fri Mar 15, 2011  9:52 AM       Regarding: Adult Medicine [facility] Lab Question       Contact: [phone]         CONFIRMED PHONE NUMBER: [phone]       Or call case manager Jaclyn Prime: [phone]              CALLERS FIRST AND LAST NAME: Gwenith Spitz       FACILITY NAME: na TITLE: na       CALLERS RELATIONSHIP:Self       RETURN CALL: General message OK              SUBJECT: General Message        REASON FOR REQUEST: Lab questions              MESSAGE: Patient is currently on the wait list to see Dr. Vernell Barrier and would like to know if he should come into [place] to have his labs done even though he does not know when he will see her. Please advise.

## 2011-03-18 ENCOUNTER — Other Ambulatory Visit (HOSPITAL_BASED_OUTPATIENT_CLINIC_OR_DEPARTMENT_OTHER): Payer: Self-pay | Admitting: Internal Medicine

## 2011-03-18 ENCOUNTER — Ambulatory Visit (HOSPITAL_BASED_OUTPATIENT_CLINIC_OR_DEPARTMENT_OTHER): Payer: No Typology Code available for payment source | Attending: Internal Medicine

## 2011-03-18 ENCOUNTER — Ambulatory Visit (HOSPITAL_BASED_OUTPATIENT_CLINIC_OR_DEPARTMENT_OTHER): Payer: No Typology Code available for payment source | Admitting: Registered"

## 2011-03-18 VITALS — Ht 63.0 in | Wt 204.2 lb

## 2011-03-18 DIAGNOSIS — E119 Type 2 diabetes mellitus without complications: Secondary | ICD-10-CM | POA: Insufficient documentation

## 2011-03-18 NOTE — Progress Notes (Signed)
Diagnosed: 04/16/10  Eye exam (annually):? Outside provider    Foot exam: (annually if neuropathy absent, every visit otherwise)-04/20/10 risk category 0   Pneumo vaccine : 03/12/02  Flu shot (annually): 02/11/11  HGBA1c (every 6 months):A1C      6.6   01/04/2011  A1C   Significant change in lab result, rechecked by lab.   01/04/2011  LDL (every 6 months): LDL       84   07/20/2010  Alb/Cr ratio goal <30 (annually) : MICROALBUMIN      8.8   07/20/2010  Creatinine: CREATININE     0.99   02/11/2011      ID  Visit type: Appointed  Interpreter present: None  Referred by:  [name]  RN    SUBJECTIVE  Justin Zavala is a 45 year old  year old male here today to see nurse for follow-up re diabetes.     LIFESTYLE  -Diet:  Drinks water and diet soda; met with [name] just prior to this appointment    -Exercise: Bowling every Saturday; basketball every Sunday; walks to the grocery store once a week and walks to the bus stop many days    -Other:  He reports he is very busy with various activities.  He continues to try to increase exercise and tries to eat more healthy.  He is aware that his A1c has improved.      DIABETES INFORMATION  -Home Glucometer Reading: Not testing    -Diabetic Foot Exam Risk Category: Not performed today      ASSESSMENT/PLAN    His BP was elevated today at 152/52.  He says he took all of his medications this am.  I will see what it is at the next appointment. I praised him for the efforts he is making and pointed out that he has lost weight in the last year and his A1c is much improved.  I scheduled an appointment with me in a month when he sees the dietician again.     -Medication Management: No change to medication    Current Outpatient Prescriptions   Medication Sig   . Albuterol 90 MCG/ACT Inhalation Aero Soln Inhale 2 puffs every 4 to 6 hours as needed HFA substitution is authorized if albuterol is not available.   . Aspirin 81 MG Oral Tab EC Take 1 tablet by mouth daily   . Divalproex Sodium  500 MG Oral Tab EC take 1.5 pills qhs, FROM [facility]   . Fluticasone Propionate  HFA (FLOVENT HFA) 220 MCG/ACT Inhalation Aerosol 1 puff twice a day EVERYDAY. Even if symptoms are no present   . Hydrochlorothiazide 25 MG Oral Tab Take 1 tablet by mouth once daily   . Lisinopril 30 MG Oral Tab take 1tab PO qday   . Loratadine 10 MG Oral Tab Take 1 tablet by mouth once daily   . MetFORMIN HCl 1000 MG Oral Tab Take 1 tablet by mouth 2 times a day   . Ranitidine HCl 150 MG Oral Tab Take 1 tablet by mouth twice a day   . RISPERIDONE OR 50mg  injection--by [facility]   . Simvastatin 10 MG Oral Tab 1 TABLET AT BEDTIME   . Skin Protectants, Misc. (EUCERIN) External Cream Apply to affected area(s) 1 to 2 times a day for dry skin         EDUCATION  -Primary learner(s): Patient  -Patient challenges to learning: Cognitive and Emotional   -Challenges impacting this teaching  session: Cognitive and Emotional   -Education materials provided: None  -Post education response: states understanding    Topics Discussed:  Diabetes  Diabetes discussion topics included:-Balance of diet, meds, and activity  Glucometer education: Did not discuss    Nutrition  Nutritional discussion topics included: Did not discuss    Exercise  Exercise discussion topics included: -Importance of exercise    Medication  Medication education discussion topics included: Did not discuss    TIME SPENT  -Total time spent teaching: 20 minutes  -Total visit time: 25 minutes

## 2011-03-20 NOTE — Progress Notes (Signed)
Nutrition Note    Clinic:  Adult Medicine    Note Type: Reassessment    Referred by: Dr Kimberlee Nearing  Same day verbal order read back: No  Date referred: 03/2008    Reason For Visit: Weight management    An interpreter was not needed for the visit.    Assessment    Medical Diagnosis: Obesity, Hypertension, Hyperlipidemia, Esophogeal Reflux and Developmental Delay, Schizophrenia    Vitals Weight Height BMI (kg/m2)   03/18/2011 204 lb 3.2 oz 5\' 3"  36.17     Adult BMI Classification: obese categ. II (35- 39.9)    Weight History:     Vitals Weight Height BMI (kg/m2)  02/25/2011 207 lb 11.2 oz 5\' 3"  36.79    Initial wt: 189.4lb (04/2008)    Labs:  DIABETES A1c   01/04/2011 6.6 (H)     DIABETES 07/20/2010   A1c 8.7 (H)      Self monitored blood glucose: does not monitor    Current Outpatient Prescriptions   Medication Sig   . Albuterol 90 MCG/ACT Inhalation Aero Soln Inhale 2 puffs every 4 to 6 hours as needed HFA substitution is authorized if albuterol is not available.   . Aspirin 81 MG Oral Tab EC Take 1 tablet by mouth daily   . Divalproex Sodium 500 MG Oral Tab EC take 1.5 pills qhs, [facility]   . Fluticasone Propionate  HFA (FLOVENT HFA) 220 MCG/ACT Inhalation Aerosol 1 puff twice a day EVERYDAY. Even if symptoms are no present   . Hydrochlorothiazide 25 MG Oral Tab Take 1 tablet by mouth once daily   . Lisinopril 30 MG Oral Tab take 1tab PO qday   . Loratadine 10 MG Oral Tab Take 1 tablet by mouth once daily   . MetFORMIN HCl 1000 MG Oral Tab Take 1 tablet by mouth 2 times a day   . Ranitidine HCl 150 MG Oral Tab Take 1 tablet by mouth twice a day   . RISPERIDONE OR 50mg  injection--by [facility]   . Simvastatin 10 MG Oral Tab 1 TABLET AT BEDTIME   . Skin Protectants, Misc. (EUCERIN) External Cream Apply to affected area(s) 1 to 2 times a day for dry skin   Ranitidine, Simvastatin, Risperdal    Vitamins/Minerals/Herbal Supplements: not discussed    Nutrition requirements: not discussed    Barriers  to adequate intake: None    GI tolerance: None    History   Substance Use Topics   . Smoking status: Never Smoker    . Smokeless tobacco: Never Used   . Alcohol Use: No     Activity: Friedrich reports that he is walking to [facility] every day and that this activity takes him ~30 minutes.  He also bowls on Saturdays and will continue with this activity until January.  He will start basketball in December and will play every Sunday.    Special/alternative diet: None    Food intolerances/allergies: None    Diet History: Sheldon has care giver Glass blower/designer) who is preparing meals and grocery shopping/going to food bank for him.  He went to the grocery store this last weekend and bought: cinnamon rolls (girlfriend's idea), oatmeal, bananas, pb, jelly, lite mayo, microwave popcorn, cheese, ground [place], and frozen pizza.  He does not think he should eat the pizza.  He has been going out to eat about 1x/wk- [employer], [employer], [employer], [place] food.  Has goal to limit to 1x/mo    Information from Patient: none  Assessment Summary: 3.5lb weight loss in past 6 weeks.  Improved diabetes control based on decreased A1c     Nutrition Diagnosis: Inability to manage self care  related to develomental delay evidenced by difficulty sticking to recommendations.    Diagnosis Reassessment:  _ Resolved (nutrition problem no longer exists)  x_ Improvement shown (Nutrition problem still exists).  3.5lb weight loss and Mcclellan able to state healthy habits.  He has good sense of what foods are healthier andseems to be benefiting from assistance of Marcelino Duster DiMiscio [phone]) from local Diabetes Program.  D/t poor cognitive abilities, Llewellyn does benefit from continued support and education  _ Unresolved no improvement shown:      _ No longer appropriate (change in condition)    Interventions:agree with his plan to limit meals at restaurants.  Help him with way to limit amount of pizza he has- suggest one piece and the rest of his plate w/ salad.    Education  Provided:  Please refer to Education section of chart     Patient stated goals: Weight loss; healthy eating    Nutrition topics taught: Weight Management::  Healthy choices and portion control    Education materials provided: no    Time spent teaching:15 mins    Plan/Recommendations    Follow up: same day as Synetta Fail     Plan:  limit trips to fast food restaurants       Total Time Spent:    [name], MS, RD, CD, CDE  [facility] Dietitian  [phone]  [address], [place], [place] [address]  Pager: [phone]

## 2011-04-22 ENCOUNTER — Ambulatory Visit (HOSPITAL_BASED_OUTPATIENT_CLINIC_OR_DEPARTMENT_OTHER): Payer: No Typology Code available for payment source | Admitting: Internal Medicine

## 2011-04-22 ENCOUNTER — Ambulatory Visit (HOSPITAL_BASED_OUTPATIENT_CLINIC_OR_DEPARTMENT_OTHER): Payer: No Typology Code available for payment source | Attending: Internal Medicine | Admitting: Registered"

## 2011-04-22 ENCOUNTER — Ambulatory Visit (HOSPITAL_BASED_OUTPATIENT_CLINIC_OR_DEPARTMENT_OTHER): Payer: No Typology Code available for payment source

## 2011-04-22 VITALS — Ht 63.0 in | Wt 206.4 lb

## 2011-04-22 VITALS — BP 175/73 | HR 90 | Ht 63.0 in | Wt 206.0 lb

## 2011-04-22 VITALS — BP 129/43 | HR 48 | Temp 97.7°F | Resp 18 | Ht 64.0 in | Wt 206.0 lb

## 2011-04-22 DIAGNOSIS — F88 Other disorders of psychological development: Secondary | ICD-10-CM | POA: Insufficient documentation

## 2011-04-22 DIAGNOSIS — E669 Obesity, unspecified: Secondary | ICD-10-CM | POA: Insufficient documentation

## 2011-04-22 DIAGNOSIS — R030 Elevated blood-pressure reading, without diagnosis of hypertension: Secondary | ICD-10-CM | POA: Insufficient documentation

## 2011-04-22 DIAGNOSIS — E119 Type 2 diabetes mellitus without complications: Secondary | ICD-10-CM | POA: Insufficient documentation

## 2011-04-22 DIAGNOSIS — E785 Hyperlipidemia, unspecified: Secondary | ICD-10-CM | POA: Insufficient documentation

## 2011-04-22 DIAGNOSIS — K219 Gastro-esophageal reflux disease without esophagitis: Secondary | ICD-10-CM | POA: Insufficient documentation

## 2011-04-22 MED ORDER — RANITIDINE HCL 150 MG OR TABS
ORAL_TABLET | ORAL | Status: DC
Start: 2011-04-22 — End: 2011-12-05

## 2011-04-22 MED ORDER — LORATADINE 10 MG OR TABS
ORAL_TABLET | ORAL | Status: DC
Start: 2011-04-22 — End: 2011-12-05

## 2011-04-22 MED ORDER — LISINOPRIL 30 MG OR TABS
ORAL_TABLET | ORAL | Status: DC
Start: 2011-04-22 — End: 2011-12-05

## 2011-04-22 MED ORDER — HYDROCHLOROTHIAZIDE 25 MG OR TABS
ORAL_TABLET | ORAL | Status: DC
Start: 2011-04-22 — End: 2011-12-05

## 2011-04-22 MED ORDER — METFORMIN HCL 1000 MG OR TABS
ORAL_TABLET | ORAL | Status: DC
Start: 2011-04-22 — End: 2011-06-17

## 2011-04-22 MED ORDER — SIMVASTATIN 10 MG OR TABS
ORAL_TABLET | ORAL | Status: DC
Start: 2011-04-22 — End: 2011-12-05

## 2011-04-22 NOTE — Progress Notes (Addendum)
Outpatient Clinic  Note      DATE: 04/22/2011     Justin Zavala is a 46 year old male who is here today for follow up on diabetes.     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     BP well controlled today.    Has been seeing Clydie Braun and Synetta Fail, weight stable, but not losing any. Still eating chips, which he knows is bad, but has very poor impulse control. Clydie Braun gave him a list of foods to eat, he thinks he can do these things. He is going to try to add broccoli to his diet. He is going to see if his cook will make his something different.     His mom is doing well after her stroke.    His breathing and coughing are stable, improving. Not coughing as much at night. He thinks the changing weather is part of it. He says he is using his inhalers every day.      Wt Readings from Last 4 Encounters:   04/22/11 206 lb (93.441 kg)   03/18/11 204 lb 3.2 oz (92.625 kg)   02/25/11 207 lb 11.2 oz (94.212 kg)   02/11/11 205 lb 3.2 oz (93.078 kg)     BP Readings from Last 4 Encounters:   04/22/11 129/43   02/11/11 158/76   01/07/11 166/71   01/04/11 157/59       REVIEW OF SYSTEMS:  Negative except as above      PHYSICAL EXAM:  BP 129/43  Pulse 48  Temp(Src) 97.7 F (36.5 C) (Temporal)  Resp 18  Ht 5\' 4"  (1.626 m)  Wt 206 lb (93.441 kg)  BMI 35.36 kg/m2:  GEN: no acute distress, dirty clothes, malodorous  PULM/CV: comfortable on room air, CTAB, no wheezing      A1C      6.6   01/04/2011  A1C   Significant change in lab result, rechecked by lab.   01/04/2011  A1C      8.7   07/20/2010  A1C     10.4   04/16/2010      ASSESSMENT AND PLAN:  46 year old man with developmental delay, HTN, DM, obesity.     Elevated blood pressure reading without diagnosis of hypertension  Comment: BP well controlled today  Plan: Lisinopril 30 MG Oral Tab, Hydrochlorothiazide         25 MG Oral Tab    HLD (hyperlipidemia)  Comment: will recheck in march  Plan: Simvastatin 10 MG  Oral Tab    Esophageal reflux  Comment: ranitidine is working for him   Plan: Ranitidine HCl 150 MG Oral Tab      Type II Diabetes: A1c sent today  Hgb A1c (at least Q6 months): A1C      6.6   01/04/2011  A1C   Significant change in lab result, rechecked by lab.   01/04/2011  A1C      8.7   07/20/2010  A1C     10.4   04/16/2010  Current Meds:  Metformin 1000BID  Lipids(Q year): LDL       84   07/20/2010 on ASA  Proteinuria: MICROALBUMIN      8.8   07/20/2010  Retinopathy: needs to make an appointment. He is due now for screening.   Neuropathy/Footexam: normal 1/13    Asthma/Allergy  Loratidine, albuterol, fluticasone      ALLERGIES:  Review of patient's  allergies indicates no known allergies.    MEDICATIONS:  Current Outpatient Prescriptions   Medication Sig   . Albuterol 90 MCG/ACT Inhalation Aero Soln Inhale 2 puffs every 4 to 6 hours as needed HFA substitution is authorized if albuterol is not available.   . Aspirin 81 MG Oral Tab EC Take 1 tablet by mouth daily   . Divalproex Sodium 500 MG Oral Tab EC take 1.5 pills qhs, FROM [facility]   . Fluticasone Propionate  HFA (FLOVENT HFA) 220 MCG/ACT Inhalation Aerosol 1 puff twice a day EVERYDAY. Even if symptoms are no present   . Hydrochlorothiazide 25 MG Oral Tab Take 1 tablet by mouth once daily   . Lisinopril 30 MG Oral Tab take 1tab PO qday   . Loratadine 10 MG Oral Tab Take 1 tablet by mouth once daily   . MetFORMIN HCl 1000 MG Oral Tab Take 1 tablet by mouth 2 times a day   . Ranitidine HCl 150 MG Oral Tab Take 1 tablet by mouth twice a day   . RISPERIDONE OR 50mg  injection--by [facility]   . Simvastatin 10 MG Oral Tab 1 TABLET AT BEDTIME   . Skin Protectants, Misc. (EUCERIN) External Cream Apply to affected area(s) 1 to 2 times a day for dry skin       PROBLEM LIST:  Patient Active Problem List   Diagnoses Date Noted   . Health care maintenance [V70.9] 04/22/2011     CV: Aspirin: taking   Lipids: at goal, on statin   Glucose: +DM   AAA(m): n/a    CA: PSA/Mammogram: n/a   Colonoscopy: at 52   ID: HIV: low risk   Hepatitis:   Syphilis:   TB:   Tdap: 2011   Pvax: 2003   Flu: 11/12   Zoster:   Bone/Falls:   EOL:   IPV:       . Asthma [493.00] 01/04/2011   . DEVELOPMENT DELAYS NEC [315.8] 05/21/2010     case manager Jaclyn Prime, cell [phone] is his guardian. Should be called after appointments to update him on any changes  mother [name] can be reached at [phone]   sister Laurena Spies can be reached at work number [phone]       . DIABETES UNCOMPL ADULT-TYPE II [250.00] 04/20/2010           Attending Note  I discussed this patient's history and physical findings with Dellis Anes, MD, as well as the plan for this patient. I have reviewed and confirm the findings and plan as documented in this note.

## 2011-04-22 NOTE — Progress Notes (Signed)
Nutrition Note    Clinic:  Adult Medicine    Note Type: Reassessment    Referred by: Dr Kimberlee Nearing  Same day verbal order read back: No  Date referred: 03/2008    Reason For Visit: Weight management    An interpreter was not needed for the visit.    Assessment    Medical Diagnosis: Obesity, Hypertension, Hyperlipidemia, Esophogeal Reflux and Developmental Delay, Schizophrenia    Vitals Weight Height BMI (kg/m2)   04/22/2011 206 lb 5\' 4"  35.36     Adult BMI Classification: obese categ. II (35- 39.9)    Weight History:     Vitals Weight Height BMI (kg/m2)   03/18/2011 204 lb 3.2 oz 5\' 3"  36.17     Initial wt: 189.4lb (04/2008)    Labs:  DIABETES A1c   01/04/2011 6.6 (H)     DIABETES 07/20/2010   A1c 8.7 (H)      Self monitored blood glucose: does not monitor    Current Outpatient Prescriptions   Medication Sig   . Albuterol 90 MCG/ACT Inhalation Aero Soln Inhale 2 puffs every 4 to 6 hours as needed HFA substitution is authorized if albuterol is not available.   . Aspirin 81 MG Oral Tab EC Take 1 tablet by mouth daily   . Divalproex Sodium 500 MG Oral Tab EC take 1.5 pills qhs, [facility]   . Fluticasone Propionate  HFA (FLOVENT HFA) 220 MCG/ACT Inhalation Aerosol 1 puff twice a day EVERYDAY. Even if symptoms are no present   . Hydrochlorothiazide 25 MG Oral Tab Take 1 tablet by mouth once daily   . Lisinopril 30 MG Oral Tab take 1tab PO qday   . Loratadine 10 MG Oral Tab Take 1 tablet by mouth once daily   . MetFORMIN HCl 1000 MG Oral Tab Take 1 tablet by mouth 2 times a day   . Ranitidine HCl 150 MG Oral Tab Take 1 tablet by mouth twice a day   . RISPERIDONE OR 50mg  injection--by [facility]   . Simvastatin 10 MG Oral Tab 1 TABLET AT BEDTIME   . Skin Protectants, Misc. (EUCERIN) External Cream Apply to affected area(s) 1 to 2 times a day for dry skin   Ranitidine, Simvastatin, Risperdal    Vitamins/Minerals/Herbal Supplements: not discussed    Nutrition requirements: not discussed    Barriers to  adequate intake: None    GI tolerance: None    History   Substance Use Topics   . Smoking status: Never Smoker    . Smokeless tobacco: Never Used   . Alcohol Use: No     Activity: Dylan playing basketball most Sundays though he missed yesterday because his girlfriend's daughter was sick.  Bowling just ended and B-ball will go until February and then track starts in the spring.  He suggests that he should walk more often    Special/alternative diet: None    Food intolerances/allergies: None    Diet History: Krist has bag of chips with him today "my snack".  He acknowledges the importance of choosing healthier snacks.  He does report that he is trying to eat smaller portions at his meals and measures his portions.  He also is going to the [employer] [facility] w/ Marcelino Duster tomorrow for a cooking class    Information from Patient: none    Assessment Summary: 2lb weight gain in past 6 weeks.  Suspect increased unhealthy snacking and the holidays factors in weight gain  Nutrition Diagnosis: Inability to manage self care  related to develomental delay evidenced by difficulty sticking to recommendations.    Diagnosis Reassessment:  _ Resolved (nutrition problem no longer exists)  x_ Improvement shown (Nutrition problem still exists).  2lb weight gain d/t poor snack choices.  Alvester does have a good sense of what foods are healthier and seems to be benefiting from assistance of Marcelino Duster DiMiscio [phone]) from local Diabetes Program.  D/t poor cognitive abilities, Osric does benefit from continued support and education  _ Unresolved no improvement shown:      _ No longer appropriate (change in condition)    Interventions:  Help Kenith come up w/ a list of healthier snacks.  Commend him on efforts to measure and limit portions at meals.  Encourage his goal of increased walking    Education Provided:  Please refer to Education section of chart     Patient stated goals: Weight loss; healthy  eating    Nutrition topics taught: Weight Management::  Healthy choices and portion control    Education materials provided: Healthy Snacks    Time spent teaching:15 mins    Plan/Recommendations    Follow up: 1 month    Plan:  Choose healthier snacks and increase walking    Total Time Spent: 45 mins    [name], MS, RD, CD, CDE  [facility] Dietitian  Box [phone]  [address] [place], [place] [address]  Pager: [phone]

## 2011-04-23 LAB — HEMOGLOBIN A1C, HPLC: Hemoglobin A1C: 6.2 % — ABNORMAL HIGH (ref 4.0–6.0)

## 2011-04-23 NOTE — Progress Notes (Signed)
Diagnosed:04/16/10  Eye exam (annually):  ? Outside provider  Foot exam: (annually if neuropathy absent, every visit otherwise)-Performed today  Pneumo vaccine : 03/12/02  Flu shot (annually): 02/11/11  HGBA1c (every 6 months):A1C      6.2   04/22/2011  LDL (every 6 months): LDL       84   07/20/2010  Alb/Cr ratio goal <30 (annually) : MICROALBUMIN      8.8   07/20/2010  Creatinine: CREATININE     0.99   02/11/2011      ID  Visit type: Appointed  Interpreter present: None  Referred by: [name], RN    SUBJECTIVE  Justin Zavala is a 46 year old  year old male  here today to see nurse for follow-up re diabetes.     LIFESTYLE  -Diet:  Met with [name] just prior to this appointment; admits to eating more junk food again    -Exercise: sporadic; bowling will end soon, he plays basketball once a week, walks with his case manager some, walks from the food bank    -Other: Jymir is aware that he has gained a few pounds recently.  He plans to start walking more and decrease the amount of junk food he eats.     DIABETES INFORMATION  -Home Glucometer Reading: Not testing    -Diabetic Foot Exam Risk Category: 0 (no loss of sensation)    Foot exam:    - Visual inspection: intact skin, no significant callus formation, no evidence of ischemia; toenails thick and discolored - Monofilament exam:  Normal - Pulse exam: DECREASED      ASSESSMENT/PLAN          [name] has been successful in decreasing his A1c.  He needs to continue to make efforts with changing his nutrition.  I told him I would not schedule another appointment with me.  He can see me prn but I advised him to continue to make appointments with [name] RD and his PCP.      -Medication Management: No change to medication    Current Outpatient Prescriptions   Medication Sig   . Albuterol 90 MCG/ACT Inhalation Aero Soln Inhale 2 puffs every 4 to 6 hours as needed HFA substitution is authorized if albuterol is not available.   . Aspirin 81 MG Oral Tab EC Take 1  tablet by mouth daily   . Divalproex Sodium 500 MG Oral Tab EC take 1.5 pills qhs, [facility]   . Fluticasone Propionate  HFA (FLOVENT HFA) 220 MCG/ACT Inhalation Aerosol 1 puff twice a day EVERYDAY. Even if symptoms are no present   . Hydrochlorothiazide 25 MG Oral Tab Take 1 tablet by mouth once daily   . Lisinopril 30 MG Oral Tab take 1tab PO qday   . Loratadine 10 MG Oral Tab Take 1 tablet by mouth once daily   . MetFORMIN HCl 1000 MG Oral Tab Take 1 tablet by mouth 2 times a day   . Ranitidine HCl 150 MG Oral Tab Take 1 tablet by mouth twice a day   . RISPERIDONE OR 50mg  injection--by [facility]   . Simvastatin 10 MG Oral Tab 1 TABLET AT BEDTIME   . Skin Protectants, Misc. (EUCERIN) External Cream Apply to affected area(s) 1 to 2 times a day for dry skin         EDUCATION  -Primary learner(s): Patient  -Patient challenges to learning: Cognitive, Emotional  and Physical limitations  -Challenges impacting  this teaching session: Cognitive, Emotional  and Physical limitations  -Education materials provided: Yes, Handout on foot care  -Post education response: states understanding    Topics Discussed:  Diabetes  Diabetes discussion topics included:-Foot care  Glucometer education: Did not discuss    Nutrition  Nutritional discussion topics included: Did not discuss    Exercise  Exercise discussion topics included: -Importance of exercise    Medication  Medication education discussion topics included: Did not discuss    TIME SPENT  -Total time spent teaching: 20 minutes  -Total visit time: 25 minutes

## 2011-06-03 ENCOUNTER — Ambulatory Visit (HOSPITAL_BASED_OUTPATIENT_CLINIC_OR_DEPARTMENT_OTHER): Payer: No Typology Code available for payment source | Admitting: Registered"

## 2011-06-03 VITALS — Ht 64.0 in | Wt 200.7 lb

## 2011-06-05 NOTE — Progress Notes (Signed)
Nutrition Note    Clinic:  Adult Medicine    Note Type: Reassessment    Referred by: Dr Kimberlee Nearing  Same day verbal order read back: No  Date referred: 03/2008    Reason For Visit: Weight management    An interpreter was not needed for the visit.    Assessment    Medical Diagnosis: Obesity, Hypertension, Hyperlipidemia, Esophogeal Reflux and Developmental Delay, Schizophrenia  Vitals Weight Height BMI (kg/m2)   06/03/2011 200 lb 11.2 oz 5\' 4"  34.45     Adult BMI Classification: obese categ. II (35- 39.9)    Weight History:   Vitals Weight Height BMI (kg/m2)   04/22/2011 206 lb 5\' 4"  35.36     Initial wt: 189.4lb (04/2008)    Labs:  DIABETES A1c   04/22/2011 6.2 (H)     DIABETES A1c   01/04/2011 6.6 (H)     DIABETES 07/20/2010   A1c 8.7 (H)      Self monitored blood glucose: does not monitor    Current Outpatient Prescriptions   Medication Sig   . Albuterol 90 MCG/ACT Inhalation Aero Soln Inhale 2 puffs every 4 to 6 hours as needed HFA substitution is authorized if albuterol is not available.   . Aspirin 81 MG Oral Tab EC Take 1 tablet by mouth daily   . Divalproex Sodium 500 MG Oral Tab EC take 1.5 pills qhs, [facility]   . Fluticasone Propionate  HFA (FLOVENT HFA) 220 MCG/ACT Inhalation Aerosol 1 puff twice a day EVERYDAY. Even if symptoms are no present   . Hydrochlorothiazide 25 MG Oral Tab Take 1 tablet by mouth once daily   . Lisinopril 30 MG Oral Tab take 1tab PO qday   . Loratadine 10 MG Oral Tab Take 1 tablet by mouth once daily   . MetFORMIN HCl 1000 MG Oral Tab Take 1 tablet by mouth 2 times a day   . Ranitidine HCl 150 MG Oral Tab Take 1 tablet by mouth twice a day   . RISPERIDONE OR 50mg  injection--by [facility]   . Simvastatin 10 MG Oral Tab 1 TABLET AT BEDTIME   . Skin Protectants, Misc. (EUCERIN) External Cream Apply to affected area(s) 1 to 2 times a day for dry skin   Ranitidine, Simvastatin, Risperdal    Vitamins/Minerals/Herbal Supplements: not discussed    Nutrition requirements:  not discussed    Barriers to adequate intake: None    GI tolerance: None    History   Substance Use Topics   . Smoking status: Never Smoker    . Smokeless tobacco: Never Used   . Alcohol Use: No     Activity: Basketball season just finished up and Justin Zavala will be starting track this week.  Justin Zavala has practice on Wednesday and Saturday and planst o take part in 142m walk and shot put    Special/alternative diet: None    Food intolerances/allergies: None    Diet History: Justin Zavala tells me that Justin Zavala is eating less junk food and fast food.  Justin Zavala really enjoys the cooking class at [employer] and plans to go the 3rd Tuesday of every month.  Justin Duster won't be working with him any more but Justin Zavala feels Justin Zavala learned quite a bit from her.  Justin Zavala makes goals today to choose fruit, vegetables, and yogurt for snacks vs chips/candy.  Justin Zavala agrees to limit junk food to one item on Fridays    Justin Zavala also asks for another copy of the recommendations for  healthier choices at fast food restaurants that I made for him a while ago     Information from Patient: none    Assessment Summary: 6lb weight loss in past 6 weeks!  A1c has also decreased another 0.4% for a total of 2.5% in the past 9 months.    Nutrition Diagnosis: Inability to manage self care  related to develomental delay evidenced by difficulty sticking to recommendations.    Diagnosis Reassessment:  _ Resolved (nutrition problem no longer exists)  x_ Improvement shown (Nutrition problem still exists).  6lb weight loss d/t healthier choices .  Justin Zavala does have a good sense of what foods are healthier and benefited from assistance of Justin Zavala [phone]) from local Diabetes Program.  D/t poor cognitive abilities, Justin Zavala does benefit from continued support and education  _ Unresolved no improvement shown:      _ No longer appropriate (change in condition)    Interventions:  Agree with Demontay goal to eat healthier snacks.  Commend him on efforts to choose healthier foods    Education Provided:   Please refer to Education section of chart     Patient stated goals: Weight loss; healthy eating    Nutrition topics taught: Weight Management::  Healthy choices and portion control    Education materials provided: written goals, fast food choices    Time spent teaching:15 mins    Plan/Recommendations    Follow up: 1 month    Plan:  Choose healthier snacks     Total Time Spent: 45 mins    [name], MS, RD, CD, CDE  [facility] Dietitian  [phone]  [address] [place], [place] [address]  Pager: [phone]

## 2011-06-17 ENCOUNTER — Other Ambulatory Visit: Payer: Self-pay | Admitting: Internal Medicine

## 2011-06-17 MED ORDER — METFORMIN HCL 1000 MG OR TABS
ORAL_TABLET | ORAL | Status: DC
Start: 2011-06-17 — End: 2011-12-05

## 2011-07-19 ENCOUNTER — Encounter (HOSPITAL_BASED_OUTPATIENT_CLINIC_OR_DEPARTMENT_OTHER): Payer: No Typology Code available for payment source | Admitting: Registered"

## 2011-08-09 ENCOUNTER — Encounter (HOSPITAL_BASED_OUTPATIENT_CLINIC_OR_DEPARTMENT_OTHER): Payer: No Typology Code available for payment source | Admitting: Registered"

## 2011-08-19 ENCOUNTER — Ambulatory Visit (HOSPITAL_BASED_OUTPATIENT_CLINIC_OR_DEPARTMENT_OTHER): Payer: No Typology Code available for payment source | Admitting: Registered"

## 2011-08-19 VITALS — Ht 64.0 in | Wt 209.5 lb

## 2011-08-19 NOTE — Progress Notes (Signed)
Nutrition Note    Clinic:  Adult Medicine    Note Type: Reassessment    Referred by: Dr Kimberlee Nearing  Same day verbal order read back: No  Date referred: 03/2008    Reason For Visit: Weight management    An interpreter was not needed for the visit.    Assessment    Medical Diagnosis: Obesity, Hypertension, Hyperlipidemia, Esophogeal Reflux and Developmental Delay, Schizophrenia    Vitals Weight Height BMI (kg/m2)   08/19/2011 209 lb 8 oz 5\' 4"  35.96     Adult BMI Classification: obese categ. II (35- 39.9)    Weight History:     Vitals Weight Height BMI (kg/m2)   06/03/2011 200 lb 11.2 oz 5\' 4"  34.45     Initial wt: 189.4lb (04/2008)    Labs:  DIABETES A1c   04/22/2011 6.2 (H)     DIABETES A1c   01/04/2011 6.6 (H)      Self monitored blood glucose: does not monitor    Current Outpatient Prescriptions   Medication Sig   . Albuterol 90 MCG/ACT Inhalation Aero Soln Inhale 2 puffs every 4 to 6 hours as needed HFA substitution is authorized if albuterol is not available.   . Aspirin 81 MG Oral Tab EC Take 1 tablet by mouth daily   . Divalproex Sodium 500 MG Oral Tab EC take 1.5 pills qhs, [facility]   . Fluticasone Propionate  HFA (FLOVENT HFA) 220 MCG/ACT Inhalation Aerosol 1 puff twice a day EVERYDAY. Even if symptoms are no present   . Hydrochlorothiazide 25 MG Oral Tab Take 1 tablet by mouth once daily   . Lisinopril 30 MG Oral Tab take 1tab PO qday   . Loratadine 10 MG Oral Tab Take 1 tablet by mouth once daily   . MetFORMIN HCl 1000 MG Oral Tab Take 1 tablet by mouth 2 times a day   . Ranitidine HCl 150 MG Oral Tab Take 1 tablet by mouth twice a day   . RISPERIDONE OR 50mg  injection--by [facility]   . Simvastatin 10 MG Oral Tab 1 TABLET AT BEDTIME   . Skin Protectants, Misc. (EUCERIN) External Cream Apply to affected area(s) 1 to 2 times a day for dry skin   Ranitidine, Simvastatin, Risperdal    Vitamins/Minerals/Herbal Supplements: not discussed    Nutrition requirements: not discussed    Barriers  to adequate intake: None    GI tolerance: None    History   Substance Use Topics   . Smoking status: Never Smoker    . Smokeless tobacco: Never Used   . Alcohol Use: No     Activity: Justin Zavala will be starting bowling soon.  He is trying to walk and makes goal to walk 6 days a week    Special/alternative diet: None    Food intolerances/allergies: None    Diet History: Justin Zavala concerned that his care giver is using too much salt in cooking.  He also would like help with how to eat on his upcoming trip to [facility].  He has only had fast food one time this month    Information from Patient: Justin Zavala upset by recent family stress.  He recounts going to a funeral and a couple of arguments between other family members.  This was very upsetting to him and he gets a bit teary during our appt.    Assessment Summary: 9lb weight gain in past 3 months.  Suspect increased stress, possible increase in sodium, and decreased activity all  contributors.      Nutrition Diagnosis: Inability to manage self care  related to develomental delay evidenced by difficulty sticking to recommendations.    Diagnosis Reassessment:  _ Resolved (nutrition problem no longer exists)   Improvement shown (Nutrition problem still exists).    x_ Unresolved no improvement shown: 9lb weight gain.  D/t poor cognitive abilities, Justin Zavala does benefit from continued support and education     _ No longer appropriate (change in condition)    Interventions:  Write recommendation for healthier seasonings to share w/ his care giver  Provide him tips for healthier restaurant choices  Encourage him to walk more    Education Provided:  Please refer to Education section of chart     Patient stated goals: use less salt, healthy eating while on trip    Nutrition topics taught: low sodium seasonings    Education materials provided: written goals, restaurant choices    Time spent teaching:15 mins    Plan/Recommendations    Follow up: 1 month    Plan:  Use meal plan to help with  healthier choices while in [place]  Stop using salt to cook with- use garlic, onion, basil, pepper, tabasco, and vinegar instead    Total Time Spent: 45 mins    [name], MS, RD, CD, CDE  [facility] Dietitian  Box [phone]  [address] [place], [place] [address]  Pager: [phone]

## 2011-08-21 ENCOUNTER — Encounter (HOSPITAL_BASED_OUTPATIENT_CLINIC_OR_DEPARTMENT_OTHER): Payer: No Typology Code available for payment source | Admitting: Internal Medicine

## 2011-08-21 ENCOUNTER — Ambulatory Visit (HOSPITAL_BASED_OUTPATIENT_CLINIC_OR_DEPARTMENT_OTHER): Payer: No Typology Code available for payment source | Attending: Internal Medicine | Admitting: Internal Medicine

## 2011-08-21 VITALS — BP 135/61 | HR 84 | Temp 97.5°F | Resp 20 | Ht 64.0 in | Wt 213.0 lb

## 2011-08-21 DIAGNOSIS — E119 Type 2 diabetes mellitus without complications: Secondary | ICD-10-CM | POA: Insufficient documentation

## 2011-08-21 LAB — LIPID PANEL
Cholesterol (LDL): 79 mg/dL (ref <130)
Cholesterol/HDL Ratio: 4.1
HDL Cholesterol: 30 mg/dL — ABNORMAL LOW (ref >40)
Non-HDL Cholesterol: 94 mg/dL (ref 0–159)
Total Cholesterol: 124 mg/dL (ref <200)
Triglyceride: 74 mg/dL (ref <150)

## 2011-08-21 LAB — ALBUMIN/CREATININE RATIO, RANDOM URINE
Albumin (Micro), URN: 1.7 mg/dL
Albumin/Creatinine Ratio, URN: 5.9 mg/g Creat (ref <30)
Creatinine/Unit, URN: 286 mg/dL

## 2011-08-21 NOTE — Progress Notes (Signed)
Outpatient Clinic  Note      DATE: 08/21/2011     Justin Zavala is a 46 year old male who is here today for follow up on diabetes. An interpreter was not needed for the visit.      INTERVAL HISTORY:  -taking metformin, its in his medi-pack. Doesn't check his sugars. Doesn't check his BP. Has a caregiver that cooks for him.   -gained some weight, caregiver reportedly starting to put more salt in his food. He has already asked her to stop now that he knows about this, karen conger also spoke with him  -didn't go to lab today  -BP ok   -decreased exercise, reports he is not exercising daily anymore because his group is every other day and his 'daughter' comes home and needs him to be there during the day. He will TRY to increase to daily.     Wt Readings from Last 4 Encounters:   08/21/11 213 lb (96.616 kg)   08/19/11 209 lb 8 oz (95.029 kg)   06/03/11 200 lb 11.2 oz (91.037 kg)   04/22/11 206 lb (93.441 kg)     BP Readings from Last 4 Encounters:   08/21/11 135/61   04/22/11 129/43   04/22/11 175/73   02/11/11 158/76       REVIEW OF SYSTEMS:  Negative as above      PHYSICAL EXAM:  BP 135/61  Pulse 84  Temp 97.5 F (36.4 C) (Temporal)  Resp 20  Ht 5\' 4"  (1.626 m)  Wt 213 lb (96.616 kg)  BMI 36.56 kg/m2:  GEN: NAD  CV: rrr no r/m/g  PULM: CTAB, no wheeze    A1C      6.2   04/22/2011  A1C      6.6   01/04/2011  A1C   Significant change in lab result, rechecked by lab.   01/04/2011  A1C      8.7   07/20/2010  A1C     10.4   04/16/2010      ASSESSMENT AND PLAN:   Diabetes mellitus  (primary encounter diagnosis)  Send the following today for yearly labs  Plan: HEMOGLOBIN A1C, HPLC, LIPID PANEL, URINE         SCREEN, MICROALBUMINURIA    Type II Diabetes:  Hgb A1c (at least Q6 months): A1C      6.2   04/22/2011  A1C      6.6   01/04/2011  A1C   Significant change in lab result, rechecked by lab.   01/04/2011  A1C      8.7   07/20/2010  Current Meds:  Metformin 1000mg  PO BID  Lipids(Q year): LDL       84    07/20/2010  Proteinuria: No results found for this basename: microalbumin:  Increase exercise  Retinopathy: needs to make an appointment. He is due now for screening.   Neuropathy/Footexam: normal 1/13    Asthma/Allergy  Loratidine, albuterol, fluticasone      ALLERGIES:  Review of patient's allergies indicates no known allergies.    MEDICATIONS:  Current Outpatient Prescriptions   Medication Sig   . Albuterol 90 MCG/ACT Inhalation Aero Soln Inhale 2 puffs every 4 to 6 hours as needed HFA substitution is authorized if albuterol is not available.   . Aspirin 81 MG Oral Tab EC Take 1 tablet by mouth daily   . Divalproex Sodium 500 MG Oral Tab EC take 1.5 pills qhs, [facility]   . Fluticasone  Propionate  HFA (FLOVENT HFA) 220 MCG/ACT Inhalation Aerosol 1 puff twice a day EVERYDAY. Even if symptoms are no present   . Hydrochlorothiazide 25 MG Oral Tab Take 1 tablet by mouth once daily   . Lisinopril 30 MG Oral Tab take 1tab PO qday   . Loratadine 10 MG Oral Tab Take 1 tablet by mouth once daily   . MetFORMIN HCl 1000 MG Oral Tab Take 1 tablet by mouth 2 times a day   . Ranitidine HCl 150 MG Oral Tab Take 1 tablet by mouth twice a day   . RISPERIDONE OR 50mg  injection--by [facility]   . Simvastatin 10 MG Oral Tab 1 TABLET AT BEDTIME   . Skin Protectants, Misc. (EUCERIN) External Cream Apply to affected area(s) 1 to 2 times a day for dry skin       PROBLEM LIST:  Patient Active Problem List    Diagnosis Date Noted   . Health care maintenance [V70.9] 04/22/2011     CV: Aspirin: taking   Lipids: at goal, on statin   Glucose: +DM   AAA(m): n/a   CA: PSA/Mammogram: n/a   Colonoscopy: at 52   ID: HIV: low risk   Hepatitis:   Syphilis:   TB:   Tdap: 2011   Pvax: 2003   Flu: 11/12   Zoster:   Bone/Falls:   EOL:   IPV:       . Asthma [493.00] 01/04/2011   . DEVELOPMENT DELAYS NEC [315.8] 05/21/2010     case manager Jaclyn Prime, cell [phone] is his guardian. Should be called after appointments to  update him on any changes  mother [name] can be reached at [phone]   sister Laurena Spies can be reached at work number [phone]       . DIABETES UNCOMPL ADULT-TYPE II [250.00] 04/20/2010

## 2011-08-21 NOTE — Patient Instructions (Signed)
Go to the lab today    Increase exercise to EVERYDAY

## 2011-08-22 LAB — HEMOGLOBIN A1C, HPLC: Hemoglobin A1C: 6.6 % — ABNORMAL HIGH (ref 4.0–6.0)

## 2011-08-27 NOTE — Progress Notes (Signed)
-------------------------------------------    Attending: Idy Rawling Renata Clevland Cork, MD  I discussed the history, exam and medical decision making for this patient with Dr. HEARST, ADELAIDE APPERSON. I was present in the clinic during the provisions of these services with no other clinical duties. I concur with the management plan as noted above.  -------------------------------------------

## 2011-09-30 ENCOUNTER — Ambulatory Visit (HOSPITAL_BASED_OUTPATIENT_CLINIC_OR_DEPARTMENT_OTHER): Payer: No Typology Code available for payment source | Admitting: Registered"

## 2011-09-30 VITALS — Ht 64.0 in | Wt 208.9 lb

## 2011-09-30 NOTE — Progress Notes (Signed)
Nutrition Note    Clinic:  Adult Medicine    Note Type: Reassessment    Referred by: Dr Kimberlee Nearing  Same day verbal order read back: No  Date referred: 03/2008    Reason For Visit: Weight management    An interpreter was not needed for the visit.    Assessment    Medical Diagnosis: Obesity, Hypertension, Hyperlipidemia, Esophogeal Reflux and Developmental Delay, Schizophrenia    Vitals Weight Height BMI (kg/m2)   09/30/2011 208 lb 14.4 oz 5\' 4"  35.86     Adult BMI Classification: obese categ. II (35- 39.9)    Weight History:     Vitals Weight Height BMI (kg/m2)   08/19/2011 209 lb 8 oz 5\' 4"  35.96     Initial wt: 189.4lb (04/2008)    Labs:  DIABETES A1c   04/22/2011 6.2 (H)     DIABETES A1c   01/04/2011 6.6 (H)      Self monitored blood glucose: does not monitor    Current Outpatient Prescriptions   Medication Sig   . Albuterol 90 MCG/ACT Inhalation Aero Soln Inhale 2 puffs every 4 to 6 hours as needed HFA substitution is authorized if albuterol is not available.   . Aspirin 81 MG Oral Tab EC Take 1 tablet by mouth daily   . Divalproex Sodium 500 MG Oral Tab EC take 1.5 pills qhs, [facility]   . Fluticasone Propionate  HFA (FLOVENT HFA) 220 MCG/ACT Inhalation Aerosol 1 puff twice a day EVERYDAY. Even if symptoms are no present   . Hydrochlorothiazide 25 MG Oral Tab Take 1 tablet by mouth once daily   . Lisinopril 30 MG Oral Tab take 1tab PO qday   . Loratadine 10 MG Oral Tab Take 1 tablet by mouth once daily   . MetFORMIN HCl 1000 MG Oral Tab Take 1 tablet by mouth 2 times a day   . Ranitidine HCl 150 MG Oral Tab Take 1 tablet by mouth twice a day   . RISPERIDONE OR 50mg  injection--by [facility]   . Simvastatin 10 MG Oral Tab 1 TABLET AT BEDTIME   . Skin Protectants, Misc. (EUCERIN) External Cream Apply to affected area(s) 1 to 2 times a day for dry skin   Ranitidine, Simvastatin, Risperdal    Vitamins/Minerals/Herbal Supplements: not discussed    Nutrition requirements: not discussed    Barriers  to adequate intake: None    GI tolerance: None    History   Substance Use Topics   . Smoking status: Never Smoker    . Smokeless tobacco: Never Used   . Alcohol Use: No     Activity: [place] in t-ball right now and has practice on T/Th through August.  He then will start bowling.  He also is trying to walk every other day.  He typically walks to [facility], goes in the store (not clear if he buys food) and then walks home- this takes him about 1 hour    Special/alternative diet: None    Food intolerances/allergies: None    Diet History: Shafer states his careworker is making one large batch of food for him to eat the following week.  He would like a little more variety.    Theoplis requesting grocery lists and meal plans  He feels like he watched what he ate on his trip to [facility] and he has only had fast food and junk food one time this month.He has been getting chicken strips at [facility] on t-ball nights.  He offers that getting a sandwich would be healthier and states he can afford to do this    Information from Patient: Giancarlo worried about his mom who is in SNF right now recovering from a fall    Assessment Summary: ~1lb weight loss in past month.       Nutrition Diagnosis: Inability to manage self care  related to develomental delay evidenced by difficulty sticking to recommendations.    Diagnosis Reassessment:  _ Resolved (nutrition problem no longer exists)   x Improvement shown (Nutrition problem still exists). ~ 1lb weight loss in past month during which time he was on a trip and ate out for all his meals. D/t poor cognitive abilities, Rodriques does benefit from continued support and education      Unresolved no improvement shown:     _ No longer appropriate (change in condition)    Interventions:    Provide him w/ grocery lists and meal plans  Suggested getting sandwich at [employer] vs chicken strips when he goes there for lunch/dinner    Education Provided:  Please refer to Education section of chart     Patient stated  goals: weight loss    Nutrition topics taught: healthy food choices    Education materials provided: grocery lists, meal plans, written goals    Time spent teaching:15 mins    Plan/Recommendations    Follow up: 1 month    Plan:  Buy sandwich instead of chicken strips    Total Time Spent: 45 mins    [name], MS, RD, CD, CDE  [facility]ulatory Care Dietitian  [phone]  [address] [place], [place] [address]  Pager: [phone]

## 2011-11-11 ENCOUNTER — Ambulatory Visit (HOSPITAL_BASED_OUTPATIENT_CLINIC_OR_DEPARTMENT_OTHER): Payer: No Typology Code available for payment source | Admitting: Registered"

## 2011-11-11 VITALS — Ht 64.0 in | Wt 197.8 lb

## 2011-11-11 NOTE — Progress Notes (Signed)
Nutrition Note    Clinic:  Adult Medicine    Note Type: Reassessment    Referred by: Dr Kimberlee Nearing  Same day verbal order read back: No  Date referred: 03/2008    Reason For Visit: Weight management    An interpreter was not needed for the visit.    Assessment    Medical Diagnosis: Obesity, Hypertension, Hyperlipidemia, Esophogeal Reflux and Developmental Delay, Schizophrenia, diabetes    Vitals Weight Height BMI (kg/m2)   11/11/2011 197 lb 12.8 oz 5\' 4"  33.95     Adult BMI Classification: obese categ. II (35- 39.9)    Weight History:     Vitals Weight Height BMI (kg/m2)   09/30/2011 208 lb 14.4 oz 5\' 4"  35.86     Initial wt: 189.4lb (04/2008)    Labs:  DIABETES A1c   08/21/2011 6.6 (H)      Self monitored blood glucose: does not monitor    Current Outpatient Prescriptions   Medication Sig   . Albuterol 90 MCG/ACT Inhalation Aero Soln Inhale 2 puffs every 4 to 6 hours as needed HFA substitution is authorized if albuterol is not available.   . Aspirin 81 MG Oral Tab EC Take 1 tablet by mouth daily   . Divalproex Sodium 500 MG Oral Tab EC take 1.5 pills qhs, [facility]   . Fluticasone Propionate  HFA (FLOVENT HFA) 220 MCG/ACT Inhalation Aerosol 1 puff twice a day EVERYDAY. Even if symptoms are no present   . Hydrochlorothiazide 25 MG Oral Tab Take 1 tablet by mouth once daily   . Lisinopril 30 MG Oral Tab take 1tab PO qday   . Loratadine 10 MG Oral Tab Take 1 tablet by mouth once daily   . MetFORMIN HCl 1000 MG Oral Tab Take 1 tablet by mouth 2 times a day   . Ranitidine HCl 150 MG Oral Tab Take 1 tablet by mouth twice a day   . RISPERIDONE OR 50mg  injection--by [facility]   . Simvastatin 10 MG Oral Tab 1 TABLET AT BEDTIME   . Skin Protectants, Misc. (EUCERIN) External Cream Apply to affected area(s) 1 to 2 times a day for dry skin   Ranitidine, Simvastatin, Risperdal    Vitamins/Minerals/Herbal Supplements: not discussed    Nutrition requirements: not discussed    Barriers to adequate intake:  None    GI tolerance: None    History   Substance Use Topics   . Smoking status: Never Smoker    . Smokeless tobacco: Never Used   . Alcohol Use: No     Activity: Justin Zavala just finished t-ball and will start bowling the end of August.  He also is trying to walk every other day.  He typically walks to [facility], goes in the store (not clear if he buys food) and then walks home- this takes him about 1 hour    Special/alternative diet: None    Food intolerances/allergies: None    Diet History: Justin Zavala states his careworker is using less salt when she cooks (T, Th, F).  His sister cooks for him on M, W, and Sun.  He has been paying attention to what he is eating.  B: oatmeal w/ banana  L: usually a pbj sandwich though out of pb and will not get any until Friday.  Plans to stop by [employer] on the way home and get tunafish  D: chicken or ground beef, rice, vegetables    Information from Patient: Justin Zavala worried about new diagnosis of  diabetes    Assessment Summary: ~10lb weight loss since June!  Pt does appear to be making more effort to choose healthier foods and limit portions.  He is also including activity most days of the weekn       Nutrition Diagnosis: Inability to manage self care  related to develomental delay evidenced by difficulty sticking to recommendations.    Diagnosis Reassessment:  _ Resolved (nutrition problem no longer exists)   x Improvement shown (Nutrition problem still exists). ~ 10lb weight loss in past 6 weeks! D/t poor cognitive abilities, Justin Zavala does benefit from continued support and education      Unresolved no improvement shown:     _ No longer appropriate (change in condition)    Interventions:    Commend pt on his efforts and encourage him to continue.    Reassure him that if he continues to pay attention to food choices and portions and include activity he will be better able to manage diabetes    Education Provided:  Please refer to Education section of chart     Patient stated goals: weight  loss    Nutrition topics taught: none    Education materials provided: none    Time spent teaching:5 mins    Plan/Recommendations    Follow up: 6 weeks    Plan:  Continue w/ current lifestyle regimen    Total Time Spent: 30 mins    [name], MS, RD, CD, CDE  [facility] Ambulatory Care Dietitian  [phone]  [address] [place], [place] [address]  Pager: [phone]

## 2011-11-25 ENCOUNTER — Ambulatory Visit (HOSPITAL_BASED_OUTPATIENT_CLINIC_OR_DEPARTMENT_OTHER): Payer: No Typology Code available for payment source | Attending: Internal Medicine | Admitting: Internal Medicine

## 2011-11-25 ENCOUNTER — Encounter (HOSPITAL_BASED_OUTPATIENT_CLINIC_OR_DEPARTMENT_OTHER): Payer: Self-pay | Admitting: Internal Medicine

## 2011-11-25 ENCOUNTER — Other Ambulatory Visit (HOSPITAL_BASED_OUTPATIENT_CLINIC_OR_DEPARTMENT_OTHER): Payer: Self-pay | Admitting: Internal Medicine

## 2011-11-25 VITALS — BP 119/47 | HR 70 | Temp 97.9°F | Resp 16 | Wt 196.0 lb

## 2011-11-25 DIAGNOSIS — E119 Type 2 diabetes mellitus without complications: Secondary | ICD-10-CM | POA: Insufficient documentation

## 2011-11-25 DIAGNOSIS — I10 Essential (primary) hypertension: Secondary | ICD-10-CM | POA: Insufficient documentation

## 2011-11-25 LAB — HEMOGLOBIN A1C, HPLC: Hemoglobin A1C: 6.4 % — ABNORMAL HIGH (ref 4.0–6.0)

## 2011-11-25 NOTE — Progress Notes (Addendum)
Outpatient Clinic  Note      DATE: 11/25/2011     Justin Zavala is a 46 year old male who is here today for follow up on diabetes. An interpreter was not needed for the visit.      INTERVAL HISTORY:  Justin Zavala is doing great. He has lost weight, won a bronze metal in T-ball at the [employer], and might be getting a job at [employer] through [employer]. 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     Caregiver is cooking for him still, and he is exercising by walking every other day. Probably walking about 10-15 minutes each time. Going to the grocery store once a month and going to food bank twice a month to get food for his house. Still spending time wit his 'fiance.' They live in his building, but not in his actual apartment. He is not sexually active with his fiance. They sleep separately.     His BP is down as well.     Doesn't check his finger sticks for blood glucose. Does report some polyuria, getting up 2 times a night, and drinking lots of water. He denies any dripping, incomplete bladder emptying, or weak stream. No burning or discomfort with urination.     Asthma is under good control. Using his inhaler when he is outside. He is using only one, but he is supposed to be using two. He doesn't know what color the inhaler is. Denies any regular episodes of SOB, wheeze or coughing. No night time symptoms. He reports he needs his inhaler only when he walks long distances.     Wt Readings from Last 4 Encounters:   11/25/11 196 lb (88.905 kg)   11/11/11 197 lb 12.8 oz (89.721 kg)   09/30/11 208 lb 14.4 oz (94.756 kg)   08/21/11 213 lb (96.616 kg)     BP Readings from Last 4 Encounters:   11/25/11 119/47   08/21/11 135/61   04/22/11 129/43   04/22/11 175/73       REVIEW OF SYSTEMS:  Negative except as above    PHYSICAL EXAM:  BP 119/47  Pulse 70  Temp 97.9 F (36.6 C) (Temporal)  Resp 16  Wt 196 lb (88.905 kg)  SpO2 98%:  GEN: NAD  PSYCH: appears in good spirits    A1C       6.4   11/25/2011  A1C      6.6   08/21/2011  A1C      6.2   04/22/2011  A1C      6.6   01/04/2011  A1C   Significant change in lab result, rechecked by lab.   01/04/2011  A1C      8.7   07/20/2010  A1C     10.4   04/16/2010    ASSESSMENT AND PLAN:  46 year old man with developmental delay, obesity, HTN, DM and asthma, doing well.     DM: Well controlled, a1c 6.6, possibly with some polyuria, but could be early BPH, will just continue to monitor  See problem list for full DM maintenance list    Asthma: unclear which inhalers he is using, but seems his symptoms are under good control.    HTN: well controlled, maybe TOO well. Can check one more time and then consider decreased in lisinopril    ALLERGIES:  Review of patient's allergies indicates no known allergies.    MEDICATIONS:  Current Outpatient Prescriptions   Medication Sig   .  Albuterol 90 MCG/ACT Inhalation Aero Soln Inhale 2 puffs every 4 to 6 hours as needed HFA substitution is authorized if albuterol is not available.   . Aspirin 81 MG Oral Tab EC Take 1 tablet by mouth daily   . Divalproex Sodium 500 MG Oral Tab EC take 1.5 pills qhs, FROM [facility]   . Fluticasone Propionate  HFA (FLOVENT HFA) 220 MCG/ACT Inhalation Aerosol 1 puff twice a day EVERYDAY. Even if symptoms are no present   . Hydrochlorothiazide 25 MG Oral Tab Take 1 tablet by mouth once daily   . Lisinopril 30 MG Oral Tab take 1tab PO qday   . Loratadine 10 MG Oral Tab Take 1 tablet by mouth once daily   . MetFORMIN HCl 1000 MG Oral Tab Take 1 tablet by mouth 2 times a day   . Ranitidine HCl 150 MG Oral Tab Take 1 tablet by mouth twice a day   . RISPERIDONE OR 50mg  injection--by [facility]   . Simvastatin 10 MG Oral Tab 1 TABLET AT BEDTIME   . Skin Protectants, Misc. (EUCERIN) External Cream Apply to affected area(s) 1 to 2 times a day for dry skin       PROBLEM LIST:  Patient Active Problem List    Diagnosis Date Noted   . Health care maintenance [V70.9] 04/22/2011     CV: Aspirin:  taking   Lipids: at goal, on statin 08/2011  Glucose: +DM   AAA(m): n/a   CA: PSA/Mammogram: n/a   Colonoscopy: at 52   ID: HIV: low risk, can check one time at next visit   Hepatitis:  Can check one time at next visit   Syphilis:   TB:   Tdap: 2011   Pvax: 2003   Flu: 11/12   Zoster:   Bone/Falls:   EOL:   IPV:       . Asthma [493.00] 01/04/2011   . DEVELOPMENT DELAYS NEC [315.8] 05/21/2010     case manager Justin Zavala, cell [phone] is his guardian. Should be called after appointments to update him on any changes  mother Justin Zavala can be reached at [phone]   sister Justin Zavala can be reached at work number [phone]       . Type II or unspecified type diabetes mellitus without mention of complication, not stated as uncontrolled [250.00] 04/20/2010     Type II Diabetes:  Hgb A1c (at least Q6 months): A1C      6.4   11/25/2011  A1C      6.6   08/21/2011  A1C      6.2   04/22/2011  Current Meds:  Metformin   Lipids(Q year): LDL       79   08/21/2011  Proteinuria: negative 08/2011  Retinopathy: seen by optho 04/2011- [facility]- outside provider   Neuropathy/Footexam: normal 10/12             Attending Statement     I discussed the patient's history, physical findings and plan with Justin Anes, MD. I have reviewed and confirm the findings and plan as documented in this note.     In addition, the patient notes UI with well controlled HbA1c and no BPH. Also no clear constipation or cough related to ACEi. May have DHIC vs behavioral issue playing a role.     Justin Forts, MD  Chief Medical Resident/Acting Instructor

## 2011-11-25 NOTE — Patient Instructions (Signed)
Please bring all of your medications to your next appointment.    If you have questions or concerns about your health, call [phone].

## 2011-11-27 NOTE — Addendum Note (Signed)
Addended by: Franne Forts on: 11/27/2011 08:54 PM     Modules accepted: Level of Service

## 2011-12-05 ENCOUNTER — Other Ambulatory Visit: Payer: Self-pay | Admitting: Internal Medicine

## 2011-12-05 MED ORDER — RANITIDINE HCL 150 MG OR TABS
ORAL_TABLET | ORAL | Status: DC
Start: 2011-12-05 — End: 2012-02-28

## 2011-12-05 MED ORDER — HYDROCHLOROTHIAZIDE 25 MG OR TABS
ORAL_TABLET | ORAL | Status: DC
Start: 2011-12-05 — End: 2012-02-28

## 2011-12-05 MED ORDER — METFORMIN HCL 1000 MG OR TABS
ORAL_TABLET | ORAL | Status: DC
Start: 2011-12-05 — End: 2012-05-28

## 2011-12-05 MED ORDER — LORATADINE 10 MG OR TABS
ORAL_TABLET | ORAL | Status: DC
Start: 2011-12-05 — End: 2012-02-28

## 2011-12-05 MED ORDER — LISINOPRIL 30 MG OR TABS
ORAL_TABLET | ORAL | Status: DC
Start: 2011-12-05 — End: 2012-02-28

## 2011-12-05 MED ORDER — SIMVASTATIN 10 MG OR TABS
ORAL_TABLET | ORAL | Status: DC
Start: 2011-12-05 — End: 2012-02-28

## 2011-12-23 ENCOUNTER — Ambulatory Visit (HOSPITAL_BASED_OUTPATIENT_CLINIC_OR_DEPARTMENT_OTHER): Payer: No Typology Code available for payment source | Admitting: Registered"

## 2011-12-23 VITALS — Ht 64.0 in | Wt 191.7 lb

## 2011-12-23 NOTE — Progress Notes (Signed)
Nutrition Note    Clinic:  Adult Medicine    Note Type: Reassessment    Referred by: Dr Kimberlee Nearing  Same day verbal order read back: No  Date referred: 03/2008    Reason For Visit: Weight management    An interpreter was not needed for the visit.    Assessment    Medical Diagnosis: Obesity, Hypertension, Hyperlipidemia, Esophogeal Reflux and Developmental Delay, Schizophrenia, diabetes    Vitals Weight Height BMI (kg/m2)   12/23/2011 191 lb 11.2 oz 5\' 4"  32.91     Adult BMI Classification: obese categ. I (30- 34.9)    Weight History:     Vitals Weight Height BMI (kg/m2)   11/11/2011 197 lb 12.8 oz 5\' 4"  33.95     Initial wt: 189.4lb (04/2008)    Labs:  DIABETES A1c   08/21/2011 6.6 (H)      Self monitored blood glucose: does not monitor    Current Outpatient Prescriptions   Medication Sig   . Albuterol 90 MCG/ACT Inhalation Aero Soln Inhale 2 puffs every 4 to 6 hours as needed HFA substitution is authorized if albuterol is not available.   . Aspirin 81 MG Oral Tab EC Take 1 tablet by mouth daily   . Divalproex Sodium 500 MG Oral Tab EC take 1.5 pills qhs, [facility]   . Fluticasone Propionate  HFA (FLOVENT HFA) 220 MCG/ACT Inhalation Aerosol 1 puff twice a day EVERYDAY. Even if symptoms are no present   . Hydrochlorothiazide 25 MG Oral Tab Take 1 tablet by mouth once daily   . Lisinopril 30 MG Oral Tab take one tablet daily   . Loratadine 10 MG Oral Tab Take 1 tablet by mouth once daily   . MetFORMIN HCl 1000 MG Oral Tab Take 1 tablet by mouth 2 times a day   . Ranitidine HCl 150 MG Oral Tab Take 1 tablet by mouth twice a day   . RISPERIDONE OR 50mg  injection--by [facility]   . Simvastatin 10 MG Oral Tab 1 TABLET AT BEDTIME   . Skin Protectants, Misc. (EUCERIN) External Cream Apply to affected area(s) 1 to 2 times a day for dry skin   Ranitidine, Simvastatin, Risperdal    Vitamins/Minerals/Herbal Supplements: not discussed    Nutrition requirements: not discussed    Barriers to adequate intake:  None    GI tolerance: None    History   Substance Use Topics   . Smoking status: Never Smoker    . Smokeless tobacco: Never Used   . Alcohol Use: No     Activity: Justin Zavala now Associate Professor diet: None    Food intolerances/allergies: None    Diet History: Justin Zavala asks for more grocery lists.  In previous appts, he stated his careworker uses less salt when she cooks (T, Th, F).  His sister cooks for him on M, W, and Sun.  He has been paying attention to what he is eating.  He also plans to go out w/ his case manager and plans to have a hamburger  B: oatmeal w/ banana  L: usually a pbj sandwich though out of pb and will not get any until Friday.  Plans to stop by [employer] on the way home and get tunafish  D: chicken or ground beef, rice, vegetables    Information from Patient: none    Assessment Summary: ~10lb weight loss since June!  Pt does appear to be making more effort to choose healthier foods and  limit portions.  He is also including activity most days of the weekn       Nutrition Diagnosis: Inability to manage self care  related to develomental delay evidenced by difficulty sticking to recommendations.    Diagnosis Reassessment:  _ Resolved (nutrition problem no longer exists)   x Improvement shown (Nutrition problem still exists). ~ 5.5lb weight loss since last month and 15.5lb weight loss in past 3 months! D/t poor cognitive abilities, Justin Zavala does benefit from continued support and education      Unresolved no improvement shown:     _ No longer appropriate (change in condition)    Interventions:    Commend pt on his efforts and encourage him to continue.    Provide him w/ grocery lists and tips for eating out    Education Provided:  Please refer to Education section of chart     Patient stated goals: weight loss    Nutrition topics taught: reviewed healthy choices at fast food restaurants    Education materials provided: Grocery Lists and Tips for Eating Out    Time spent teaching: 5  mins    Plan/Recommendations    Follow up: 02/28/12    Plan:  Continue w/ current lifestyle regimen    Total Time Spent: 30 mins    [name], MS, RD, CD, CDE  [facility] Ambulatory Care Dietitian  [phone]  [address] [place], [place] [address]  Pager: [phone]

## 2012-02-05 ENCOUNTER — Other Ambulatory Visit: Payer: Self-pay | Admitting: Internal Medicine

## 2012-02-05 MED ORDER — ASPIRIN 81 MG OR TBEC
DELAYED_RELEASE_TABLET | ORAL | Status: DC
Start: 2012-02-05 — End: 2012-02-28

## 2012-02-26 ENCOUNTER — Other Ambulatory Visit (HOSPITAL_BASED_OUTPATIENT_CLINIC_OR_DEPARTMENT_OTHER): Payer: Self-pay | Admitting: Internal Medicine

## 2012-02-28 ENCOUNTER — Ambulatory Visit (HOSPITAL_BASED_OUTPATIENT_CLINIC_OR_DEPARTMENT_OTHER): Payer: No Typology Code available for payment source | Admitting: Registered"

## 2012-02-28 ENCOUNTER — Encounter (HOSPITAL_BASED_OUTPATIENT_CLINIC_OR_DEPARTMENT_OTHER): Payer: Self-pay | Admitting: Internal Medicine

## 2012-02-28 ENCOUNTER — Ambulatory Visit (HOSPITAL_BASED_OUTPATIENT_CLINIC_OR_DEPARTMENT_OTHER): Payer: No Typology Code available for payment source | Attending: Internal Medicine | Admitting: Internal Medicine

## 2012-02-28 VITALS — Ht 64.0 in | Wt 182.6 lb

## 2012-02-28 VITALS — BP 125/43 | HR 73 | Temp 97.2°F | Ht 64.0 in

## 2012-02-28 DIAGNOSIS — E119 Type 2 diabetes mellitus without complications: Secondary | ICD-10-CM | POA: Insufficient documentation

## 2012-02-28 DIAGNOSIS — E785 Hyperlipidemia, unspecified: Secondary | ICD-10-CM | POA: Insufficient documentation

## 2012-02-28 DIAGNOSIS — K219 Gastro-esophageal reflux disease without esophagitis: Secondary | ICD-10-CM | POA: Insufficient documentation

## 2012-02-28 DIAGNOSIS — I10 Essential (primary) hypertension: Secondary | ICD-10-CM | POA: Insufficient documentation

## 2012-02-28 DIAGNOSIS — B353 Tinea pedis: Secondary | ICD-10-CM | POA: Insufficient documentation

## 2012-02-28 LAB — HEMOGLOBIN A1C, HPLC: Hemoglobin A1C: 5.8 % (ref 4.0–6.0)

## 2012-02-28 MED ORDER — SIMVASTATIN 10 MG OR TABS
ORAL_TABLET | ORAL | Status: DC
Start: 2012-02-28 — End: 2012-06-05

## 2012-02-28 MED ORDER — HYDROCHLOROTHIAZIDE 25 MG OR TABS
ORAL_TABLET | ORAL | Status: DC
Start: 2012-02-28 — End: 2012-06-05

## 2012-02-28 MED ORDER — ASPIRIN 81 MG OR TBEC
DELAYED_RELEASE_TABLET | ORAL | Status: DC
Start: 2012-02-28 — End: 2012-06-05

## 2012-02-28 MED ORDER — FLUTICASONE PROPIONATE HFA 220 MCG/ACT IN AERO
INHALATION_SPRAY | RESPIRATORY_TRACT | Status: DC
Start: 2012-02-28 — End: 2012-06-05

## 2012-02-28 MED ORDER — RANITIDINE HCL 150 MG OR TABS
ORAL_TABLET | ORAL | Status: DC
Start: 2012-02-28 — End: 2012-06-05

## 2012-02-28 MED ORDER — CLOTRIMAZOLE 1 % EX CREA
TOPICAL_CREAM | CUTANEOUS | Status: DC
Start: 2012-02-28 — End: 2012-06-05

## 2012-02-28 MED ORDER — ALBUTEROL SULFATE HFA 108 (90 BASE) MCG/ACT IN AERS
INHALATION_SPRAY | RESPIRATORY_TRACT | Status: DC
Start: 2012-02-28 — End: 2012-06-05

## 2012-02-28 MED ORDER — LISINOPRIL 30 MG OR TABS
ORAL_TABLET | ORAL | Status: DC
Start: 2012-02-28 — End: 2012-05-28

## 2012-02-28 MED ORDER — LORATADINE 10 MG OR TABS
ORAL_TABLET | ORAL | Status: DC
Start: 2012-02-28 — End: 2012-06-05

## 2012-02-28 MED ORDER — EUCERIN EX CREA
TOPICAL_CREAM | CUTANEOUS | Status: DC
Start: 2012-02-28 — End: 2012-06-05

## 2012-02-28 NOTE — Patient Instructions (Addendum)
Start using lotion (eucerin) and clotrimazole on your feet EVERY DAY

## 2012-02-28 NOTE — Progress Notes (Signed)
Outpatient Clinic  Note      DATE: 02/28/2012     Justin Zavala is a 46 year old male who is here today for follow up on diabetes. An interpreter was not needed for the visit.      INTERVAL HISTORY:  Reports he has been doing well. Is exercising nearly every day, and has been losing more weight.     He has no other complaints today, but needs refills on his medications.     Wt Readings from Last 4 Encounters:   02/28/12 182 lb 9.6 oz (82.827 kg)   12/23/11 191 lb 11.2 oz (86.955 kg)   11/25/11 196 lb (88.905 kg)   11/11/11 197 lb 12.8 oz (89.721 kg)     BP Readings from Last 4 Encounters:   02/28/12 125/43   11/25/11 119/47   08/21/11 135/61   04/22/11 129/43         REVIEW OF SYSTEMS:  Negative except as noted above       PHYSICAL EXAM:  BP 125/43  Pulse 73  Temp(Src) 97.2 F (36.2 C) (Temporal)  Ht 5\' 4"  (1.626 m):    GEN: no acute distress, hygiene continues to be poor  HEENT: anicteric, poor dentition  FOOT EXAM: 2+ pulses bilaterally, VERY dry and cracking skin with callus on heels and bilateral medal surface of foot. +onychomycosis of great toes with dystropic nails. Intact monofilament exam       A1C      5.8   02/28/2012  A1C      6.4   11/25/2011  A1C      6.6   08/21/2011  A1C      6.2   04/22/2011  A1C      6.6   01/04/2011  A1C   Significant change in lab result, rechecked by lab.   01/04/2011  A1C      8.7   07/20/2010      ASSESSMENT AND PLAN:  46 year old man with well controlled adult onset type II diabetes, doing quite well, here for refills.     HLD (hyperlipidemia)  (primary encounter diagnosis)  Plan: Simvastatin 10 MG Oral Tab, Aspirin 81 MG Oral         Tab EC    Esophageal reflux- refill ranitidine prn    HTN  Plan: Lisinopril 30 MG Oral Tab, Hydrochlorothiazide         25 MG Oral Tab    Dermatophytosis of foot  Plan: Skin Protectants, Misc. (EUCERIN) External         Cream, Clotrimazole 1 % External Cream    Diabetes mellitus  Type II Diabetes:  Hgb A1c (at least Q6  months): A1c looks great.    A1C      5.8   02/28/2012  A1C      6.4   11/25/2011  A1C      6.6   08/21/2011  Current Meds:  Metformin 1000 BID   Lipids(Q year): LDL       79   08/21/2011  See problem list item below for other maint,    Asthma  Plan: Ranitidine HCl 150 MG Oral Tab, Loratadine 10         MG Oral Tab, Fluticasone Propionate  HFA         (FLOVENT HFA) 220 MCG/ACT Inhalation Aerosol,         Albuterol Sulfate HFA 108 (90 BASE) MCG/ACT         Inhalation  Aero Soln    Flu vaccine need  Plan: INFLUENZA VACC, NO PRESERV 3 YRS AND OLDER         0.5ML/IM      ALLERGIES:  Review of patient's allergies indicates no known allergies.    MEDICATIONS:  Current Outpatient Prescriptions   Medication Sig   . Albuterol 90 MCG/ACT Inhalation Aero Soln Inhale 2 puffs every 4 to 6 hours as needed HFA substitution is authorized if albuterol is not available.   . Aspirin 81 MG Oral Tab EC Take 1 tablet by mouth daily   . Divalproex Sodium 500 MG Oral Tab EC take 1.5 pills qhs, [facility]   . Fluticasone Propionate  HFA (FLOVENT HFA) 220 MCG/ACT Inhalation Aerosol 1 puff twice a day EVERYDAY. Even if symptoms are no present   . Hydrochlorothiazide 25 MG Oral Tab Take 1 tablet by mouth once daily   . Lisinopril 30 MG Oral Tab take one tablet daily   . Loratadine 10 MG Oral Tab Take 1 tablet by mouth once daily   . MetFORMIN HCl 1000 MG Oral Tab Take 1 tablet by mouth 2 times a day   . Ranitidine HCl 150 MG Oral Tab Take 1 tablet by mouth twice a day   . RISPERIDONE OR 50mg  injection--by [facility]   . Simvastatin 10 MG Oral Tab 1 TABLET AT BEDTIME   . Skin Protectants, Misc. (EUCERIN) External Cream Apply to affected area(s) 1 to 2 times a day for dry skin     No current facility-administered medications for this visit.       PROBLEM LIST:  Patient Active Problem List    Diagnosis Date Noted   . Health care maintenance [V70.9] 04/22/2011     CV: Aspirin: taking   Lipids: at goal, on statin 08/2011  Glucose:  +DM   AAA(m): n/a   CA: PSA/Mammogram: n/a   Colonoscopy: at 52   ID: HIV: low risk, can check one time at next visit   Hepatitis:  Can check one time at next visit   Syphilis:   TB:   Tdap: 2011   Pvax: 2003   Flu: 11/12, 11/3  Zoster:   Bone/Falls:   EOL:   IPV:       . Asthma [493.00] 01/04/2011   . DEVELOPMENT DELAYS NEC [315.8] 05/21/2010     case manager Jaclyn Prime, cell [phone] is his guardian. Should be called after appointments to update him on any changes  mother [name] can be reached at [phone]   sister Laurena Spies can be reached at work number [phone]       . Type II or unspecified type diabetes mellitus without mention of complication, not stated as uncontrolled [250.00] 04/20/2010     Type II Diabetes:  Hgb A1c (at least Q6 months): A1C      6.4   11/25/2011  A1C      6.6   08/21/2011  A1C      6.2   04/22/2011  Current Meds:  Metformin   Lipids(Q year): LDL       79   08/21/2011  Proteinuria: negative 08/2011  Retinopathy: seen by optho 04/2011- [facility]- outside provider   Neuropathy/Footexam: 11/13, no neuropathy, significant dry skin and dermatophytosis as well as onychomycosis.

## 2012-03-02 NOTE — Progress Notes (Signed)
Nutrition Note    Clinic:  Adult Medicine    Note Type: Reassessment    Referred by: Dr Kimberlee Nearing  Same day verbal order read back: No  Date referred: 03/2008    Reason For Visit: Weight management    An interpreter was not needed for the visit.    Assessment    Medical Diagnosis: Obesity, Hypertension, Hyperlipidemia, Esophogeal Reflux and Developmental Delay, Schizophrenia, diabetes    Weight History:     Vitals Height Weight BMI (kg/m2)   02/28/2012 5\' 4"  182 lb 9.6 oz 31.34     Adult BMI Classification: obese categ. I (30- 34.9)    Vitals Weight Height BMI (kg/m2)   12/23/2011 191 lb 11.2 oz 5\' 4"  32.91     Initial wt: 189.4lb (04/2008)    Labs:  DIABETES A1C   Latest Ref Rng 4.0 - 6.0 %   11/25/2011 6.4 (H)     DIABETES A1c   08/21/2011 6.6 (H)      Self monitored blood glucose: does not monitor    Current Outpatient Prescriptions   Medication Sig   . Albuterol Sulfate HFA 108 (90 BASE) MCG/ACT Inhalation Aero Soln Inhale 2 puffs by mouth every 6 hours if needed for shortness of breath   . Aspirin 81 MG Oral Tab EC Take 1 tablet by mouth daily   . Clotrimazole 1 % External Cream Apply to feet 2 times a day for fungal rash   . Divalproex Sodium 500 MG Oral Tab EC take 1.5 pills qhs, [facility]   . Fluticasone Propionate  HFA (FLOVENT HFA) 220 MCG/ACT Inhalation Aerosol 1 puff twice a day EVERYDAY. Even if symptoms are no present   . Hydrochlorothiazide 25 MG Oral Tab Take 1 tablet by mouth once daily   . Lisinopril 30 MG Oral Tab take one tablet daily   . Loratadine 10 MG Oral Tab Take 1 tablet by mouth once daily   . MetFORMIN HCl 1000 MG Oral Tab Take 1 tablet by mouth 2 times a day   . Ranitidine HCl 150 MG Oral Tab Take 1 tablet by mouth twice a day   . RISPERIDONE OR 50mg  injection--[facility]   . Simvastatin 10 MG Oral Tab 1 TABLET AT BEDTIME   . Skin Protectants, Misc. (EUCERIN) External Cream Apply to affected area(s) 1 to 2 times a day for dry skin     No current  facility-administered medications for this visit.   Ranitidine, Simvastatin, Risperdal    Vitamins/Minerals/Herbal Supplements: not discussed    Nutrition requirements: not discussed    Barriers to adequate intake: None    GI tolerance: None    History   Substance Use Topics   . Smoking status: Never Smoker    . Smokeless tobacco: Never Used   . 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  In previous appts, he stated his careworker uses less salt when she cooks (T, Th, F).  His sister cooks for him on M, W, and Sun.  He has been paying attention to what he is eating.  He also plans to go out w/ his case manager and plans to have a hamburger  B:cereal w/ skim milk and water  L:leftovers; goes out on Tuesdays and has chicken strips  D: shrimp, rice, vegetables    Beverages:  water, diet pop    Goes out for fast food w/ Luisa Hart his case manager 1x/mo    Information from Patient: none    Assessment Summary: ~9.5lb weight loss since last appt and 26lb since June!  Pt does appear to be making more effort to choose healthier foods and limit portions.  He is also including activity most days of the week       Nutrition Diagnosis: Inability to manage self care  related to develomental delay evidenced by difficulty sticking to recommendations.    Diagnosis Reassessment:  _ Resolved (nutrition problem no longer exists)   x Improvement shown (Nutrition problem still exists). ~ 9.5lb weight loss since last month and 26lb weight loss in past 5 months! D/t poor cognitive abilities, Bush does benefit from continued support and education      Unresolved no improvement shown:     _ No longer appropriate (change in condition)    Interventions:    Commend pt on his efforts and encourage him to continue.    Provide him w/ grocery lists     Education Provided:  Please refer to Education section of chart      Patient stated goals: weight loss    Nutrition topics taught: reviewed healthy choices at fast food restaurants    Education materials provided: Grocery Lists and Tips for Eating Out    Time spent teaching: 5 mins    Plan/Recommendations    Follow up: 05/01/12    Plan:  Continue w/ current lifestyle regimen    Total Time Spent: 30 mins    [name], MS, RD, CD, CDE  [facility]  [phone]  [address], [place], [place] [address]  Pager: [phone]

## 2012-03-08 NOTE — Progress Notes (Signed)
-------------------------------------------    Attending: Reuven Braver Rose Giovanni, MD  I discussed the history, exam and medical decision making for this patient with Dr. HEARST, ADELAIDE APPERSON. I was present in the clinic during the provisions of these services with no other clinical duties. I concur with the management plan as noted above.  -------------------------------------------

## 2012-05-01 ENCOUNTER — Encounter (HOSPITAL_BASED_OUTPATIENT_CLINIC_OR_DEPARTMENT_OTHER): Payer: No Typology Code available for payment source | Admitting: Registered"

## 2012-05-26 ENCOUNTER — Telehealth (HOSPITAL_BASED_OUTPATIENT_CLINIC_OR_DEPARTMENT_OTHER): Payer: Self-pay

## 2012-05-26 NOTE — Telephone Encounter (Signed)
Message copied by Samella Parr on Tue May 26, 2012  2:13 PM  ------       Message from: Antonieta Iba       Created: Tue May 26, 2012 10:25 AM       Regarding: [facility]       Contact: [phone]         CONFIRMED PHONE NUMBER: [phone]       CALLERS FIRST AND LAST NAME: Bonnetta Barry NAME: [facility] TITLE: Pharmacist       CALLERS RELATIONSHIP:OTHER: Pharmacy       RETURN CALL: OK to leave detailed message with anyone that answers              SUBJECT: General Message        REASON FOR REQUEST: Pharmacist has questions about Licnaprol and Metformin prescriptions              MESSAGE: Pharmacy needs to know how many months of medication are authorized total.  Please call                              ------

## 2012-05-26 NOTE — Telephone Encounter (Signed)
Forwarded to [facility] for f/u.

## 2012-05-28 ENCOUNTER — Other Ambulatory Visit: Payer: Self-pay | Admitting: Internal Medicine

## 2012-05-28 MED ORDER — LISINOPRIL 30 MG OR TABS
ORAL_TABLET | ORAL | Status: DC
Start: 2012-05-28 — End: 2012-06-05

## 2012-05-28 MED ORDER — METFORMIN HCL 1000 MG OR TABS
ORAL_TABLET | ORAL | Status: DC
Start: 2012-05-28 — End: 2012-06-05

## 2012-06-02 ENCOUNTER — Encounter (HOSPITAL_BASED_OUTPATIENT_CLINIC_OR_DEPARTMENT_OTHER): Payer: No Typology Code available for payment source | Admitting: Internal Medicine

## 2012-06-05 ENCOUNTER — Ambulatory Visit (HOSPITAL_BASED_OUTPATIENT_CLINIC_OR_DEPARTMENT_OTHER): Payer: No Typology Code available for payment source | Admitting: Registered"

## 2012-06-05 ENCOUNTER — Ambulatory Visit (HOSPITAL_BASED_OUTPATIENT_CLINIC_OR_DEPARTMENT_OTHER): Payer: No Typology Code available for payment source | Attending: Internal Medicine | Admitting: Internal Medicine

## 2012-06-05 ENCOUNTER — Other Ambulatory Visit (HOSPITAL_BASED_OUTPATIENT_CLINIC_OR_DEPARTMENT_OTHER): Payer: Self-pay

## 2012-06-05 ENCOUNTER — Encounter (HOSPITAL_BASED_OUTPATIENT_CLINIC_OR_DEPARTMENT_OTHER): Payer: Self-pay | Admitting: Internal Medicine

## 2012-06-05 VITALS — Ht 64.0 in | Wt 197.0 lb

## 2012-06-05 DIAGNOSIS — E119 Type 2 diabetes mellitus without complications: Secondary | ICD-10-CM | POA: Insufficient documentation

## 2012-06-05 DIAGNOSIS — R030 Elevated blood-pressure reading, without diagnosis of hypertension: Secondary | ICD-10-CM | POA: Insufficient documentation

## 2012-06-05 DIAGNOSIS — R5383 Other fatigue: Secondary | ICD-10-CM | POA: Insufficient documentation

## 2012-06-05 DIAGNOSIS — Z23 Encounter for immunization: Secondary | ICD-10-CM | POA: Insufficient documentation

## 2012-06-05 DIAGNOSIS — B353 Tinea pedis: Secondary | ICD-10-CM | POA: Insufficient documentation

## 2012-06-05 DIAGNOSIS — Z0000 Encounter for general adult medical examination without abnormal findings: Secondary | ICD-10-CM | POA: Insufficient documentation

## 2012-06-05 DIAGNOSIS — Z713 Dietary counseling and surveillance: Secondary | ICD-10-CM | POA: Insufficient documentation

## 2012-06-05 DIAGNOSIS — R5381 Other malaise: Secondary | ICD-10-CM | POA: Insufficient documentation

## 2012-06-05 DIAGNOSIS — E785 Hyperlipidemia, unspecified: Secondary | ICD-10-CM | POA: Insufficient documentation

## 2012-06-05 DIAGNOSIS — G4733 Obstructive sleep apnea (adult) (pediatric): Secondary | ICD-10-CM | POA: Insufficient documentation

## 2012-06-05 LAB — GLUCOSE POC, HMC: Glucose (POC): 152 mg/dL — ABNORMAL HIGH (ref 62–125)

## 2012-06-05 LAB — CBC (HEMOGRAM)
Hematocrit: 41 % (ref 38–50)
Hemoglobin: 14.1 g/dL (ref 13.0–18.0)
MCH: 29.7 pg (ref 27.3–33.6)
MCHC: 34.4 g/dL (ref 32.2–36.5)
MCV: 86 fL (ref 81–98)
Platelet Count: 145 10*3/uL — ABNORMAL LOW (ref 150–400)
RBC: 4.75 mil/uL (ref 4.40–5.60)
RDW-CV: 13.7 % (ref 11.6–14.4)
WBC: 4.52 10*3/uL (ref 4.30–10.00)

## 2012-06-05 LAB — PR GLUCOSE, WHOLE BLOOD, ONSITE: Glucose: 152 mg/dl — AB (ref 62–125)

## 2012-06-05 LAB — HIV ANTIGEN AND ANTIBODY SCRN
HIV Antigen and Antibody Interpretation: NONREACTIVE
HIV Antigen and Antibody Result: NONREACTIVE

## 2012-06-05 MED ORDER — LORATADINE 10 MG OR TABS
ORAL_TABLET | ORAL | Status: DC
Start: 2012-06-05 — End: 2012-12-31

## 2012-06-05 MED ORDER — HYDROCHLOROTHIAZIDE 25 MG OR TABS
ORAL_TABLET | ORAL | Status: DC
Start: 2012-06-05 — End: 2012-12-31

## 2012-06-05 MED ORDER — SIMVASTATIN 10 MG OR TABS
ORAL_TABLET | ORAL | Status: DC
Start: 2012-06-05 — End: 2013-01-01

## 2012-06-05 MED ORDER — RANITIDINE HCL 150 MG OR TABS
ORAL_TABLET | ORAL | Status: DC
Start: 2012-06-05 — End: 2012-12-31

## 2012-06-05 MED ORDER — METFORMIN HCL 1000 MG OR TABS
ORAL_TABLET | ORAL | Status: DC
Start: 2012-06-05 — End: 2012-12-02

## 2012-06-05 MED ORDER — ALBUTEROL SULFATE HFA 108 (90 BASE) MCG/ACT IN AERS
INHALATION_SPRAY | RESPIRATORY_TRACT | Status: DC
Start: 2012-06-05 — End: 2012-08-26

## 2012-06-05 MED ORDER — LISINOPRIL 30 MG OR TABS
ORAL_TABLET | ORAL | Status: DC
Start: 2012-06-05 — End: 2012-12-02

## 2012-06-05 MED ORDER — FLUTICASONE PROPIONATE HFA 220 MCG/ACT IN AERO
INHALATION_SPRAY | RESPIRATORY_TRACT | Status: DC
Start: 2012-06-05 — End: 2012-08-26

## 2012-06-05 MED ORDER — CLOTRIMAZOLE 1 % EX CREA
TOPICAL_CREAM | CUTANEOUS | Status: DC
Start: 2012-06-05 — End: 2012-08-26

## 2012-06-05 MED ORDER — EUCERIN EX CREA
TOPICAL_CREAM | CUTANEOUS | Status: DC
Start: 2012-06-05 — End: 2015-07-10

## 2012-06-05 MED ORDER — ASPIRIN 81 MG OR TBEC
DELAYED_RELEASE_TABLET | ORAL | Status: DC
Start: 2012-06-05 — End: 2013-06-04

## 2012-06-05 NOTE — Progress Notes (Signed)
Procedure    Ordered by: Dellis Anes, MD  Procedure:blood glucose  Result: 152  Duration: 5 min  Evaluation of Care: patient tolerated procedure well

## 2012-06-05 NOTE — Progress Notes (Signed)
Nutrition Note    Clinic:  Adult Medicine    Note Type: Reassessment    Referred by: Dr Kimberlee Nearing  Same day verbal order read back: No  Date referred: 03/2008    Reason For Visit: Weight management    An interpreter was not needed for the visit.    Assessment    Medical Diagnosis: Obesity, Hypertension, Hyperlipidemia, Esophogeal Reflux and Developmental Delay, Schizophrenia, diabetes    Weight History:     Vitals 06/05/2012   Height 5\' 4"    Weight 197 lb   BMI (kg/m2) 33.82     Adult BMI Classification: obese categ. I (30- 34.9)    Vitals Height Weight BMI (kg/m2)   02/28/2012 5\' 4"  182 lb 9.6 oz 31.34     Initial wt: 189.4lb (04/2008)    DIABETES A1C   Latest Ref Rng 4.0 - 6.0 %   11/25/2011 6.4 (H)     DIABETES A1c   08/21/2011 6.6 (H)     Labs:    DIABETES A1C   Latest Ref Rng 4.0 - 6.0 %   11/25/2011 6.4 (H)   02/28/2012 5.8     Self monitored blood glucose: does not monitor    Current Outpatient Prescriptions   Medication Sig   . Albuterol Sulfate HFA 108 (90 BASE) MCG/ACT Inhalation Aero Soln Inhale 2 puffs by mouth every 6 hours if needed for shortness of breath   . Aspirin 81 MG Oral Tab EC Take 1 tablet by mouth daily   . Clotrimazole 1 % External Cream Apply to feet 2 times a day for fungal rash   . Divalproex Sodium 500 MG Oral Tab EC take 1.5 pills qhs, [facility]   . Fluticasone Propionate  HFA (FLOVENT HFA) 220 MCG/ACT Inhalation Aerosol 1 puff twice a day EVERYDAY. Even if symptoms are no present   . Hydrochlorothiazide 25 MG Oral Tab Take 1 tablet by mouth once daily   . Lisinopril 30 MG Oral Tab take one tablet daily   . Loratadine 10 MG Oral Tab Take 1 tablet by mouth once daily   . MetFORMIN HCl 1000 MG Oral Tab Take 1 tablet by mouth 2 times a day   . Ranitidine HCl 150 MG Oral Tab Take 1 tablet by mouth twice a day   . RISPERIDONE OR 50mg  injection--[facility]   . Simvastatin 10 MG Oral Tab 1 TABLET AT BEDTIME   . Skin Protectants, Misc. (EUCERIN) External Cream Apply to  affected area(s) 1 to 2 times a day for dry skin     No current facility-administered medications for this visit.   Ranitidine, Simvastatin, Risperdal    Vitamins/Minerals/Herbal Supplements: not discussed    Nutrition requirements: not discussed    Barriers to adequate intake: None    GI tolerance: None    History   Substance Use Topics   . Smoking status: Never Smoker    . Smokeless tobacco: Never Used   . 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  Von also going to the food bank and reports that he does get donuts there as well as potatoes, canned goods.  He admits he ate a large bag of potato chips for a treat last week.    B:cereal w/ skim  milk and water; sometimes donuts/muffins from the food bank  L:leftovers; goes out on Tuesdays and has chicken strips or chow mein  D: shrimp, rice, vegetables    Beverages: water, diet pop    Goes out for fast food w/ Luisa Hart his case manager 1x/mo    Information from Patient: none    Assessment Summary: ~15lb weight gain since last appt.  Suspect that pt has been eating larger portions and snacking more than at previous visits.  I have not seen him since before the holidays so increased intake of high calorie foods over the holidays may also be a factor.      Nutrition Diagnosis: Inability to manage self care  related to develomental delay evidenced by difficulty sticking to recommendations.    Diagnosis Reassessment:  _ Resolved (nutrition problem no longer exists)    Improvement shown (Nutrition problem still exists).   xUnresolved no improvement shown:    _ No longer appropriate (change in condition)    Interventions:    Work w/ him to come up with alternative to large bag of chips for treat- pt able to suggest smaller bag  Provide him w/ grocery lists to use at store and food  banks   Give him meal plan ideas    Education Provided:  Please refer to Education section of chart     Patient stated goals: weight loss    Nutrition topics taught: reviewed healthy choices at fast food restaurants    Education materials provided: Grocery Lists and meal plans    Time spent teaching: 1 5 mins    Plan/Recommendations    Follow up: 6 weeks    Plan:    Choose healthier options at food bank and grocery stores  Buy smaller bag of chips for occasional treat    Total Time Spent: 45 mins    [name], MS, RD, CD, CDE  [facility]  Box [phone]  [address], [place], [place] [address]  Pager: [phone]

## 2012-06-05 NOTE — Progress Notes (Signed)
VACCINE SCREENING/ORDER WHEN CONTRAINDICATIONS PRESENT    Order date: 06/05/2012  Ordering provider: Dr Annell Greening  Clinic stock used: YES   Interpreter used?: None  Vaccine information sheet(s) discussed, patient/parent/guardian verbalized understanding? YES   Vaccines given: Influenza vaccine  VIS given 06/05/2012 by Hilbert Corrigan MA.      SCREENING: INACTIVATED VACCINES  NO 1. Do you have a high fever, severe cold-like symptoms or serious infection?  NO 2. Do you have any food allergies such as eggs, chicken, chicken feathers, chicken dander, or gelatin?  NO 3. Do you have any allergies to neomycin, streptomycin, or polymyxin?   NO 4. Have you had any severe reaction to a vaccine in the past such as a seizure, a convulsion from a high fever, or a paralysis problem called Guillain-Barre Syndrome? If so, which vaccine(s):   If the patient/parent/or guardian answered "yes" to any of the above questions, consult with the provider to review the responses prior to administering vaccination. Parents with a contraindication to immunization will not receive the immunization without a specific physician order to vaccinate the patient and discussion of risk and benefits related to the contraindication.     Physician Order for Administration of Vaccine(s) When Contraindications Are Present  I have reviewed the existence in this patient's health history of possible contraindication(s) to administration of vaccine. The benefits to this patient outweigh the potential risks of immunization. Administer vaccine(s):  Hilbert Corrigan, 06/05/2012 4:00 PM MA. Pt tolerated well left without complication.

## 2012-06-05 NOTE — Progress Notes (Addendum)
Outpatient Clinic  Note      DATE: 06/05/2012     Justin Zavala is a 47 year old male who is here today for follow up on diabetes.       INTERVAL HISTORY:  Neeeds his inhaler replaced, they didn't refill because of the change in flovent.     His house in being cleaning for asbestos right now so he is supposed to be staying with his mom, but he has decided not to do that. He may move to an adult family home, at least for a short time.     He had a cold, but is feeling better.     He reports that he is sleeping a lot. He sleeps every day after he gets home. He isn't sure if she snores at night. He feels rested when he wakes up, but is then tired most of the day. Reports mood is "OK." Not sure if he is nodding off during the day, but he says his family has noted he is more tired.    He also has trouble sleeping and uses the bathroom three times a night. He has no weakened stream. It doesn't stop and start. He reports that he empties his bladder entirely when he does urinate, and ne ver has dripping or incontinence. He goes to bed at 8pm, wakes up at 11am.     Wt Readings from Last 4 Encounters:   06/05/12 197 lb (89.359 kg)   02/28/12 182 lb 9.6 oz (82.827 kg)   12/23/11 191 lb 11.2 oz (86.955 kg)   11/25/11 196 lb (88.905 kg)     BP Readings from Last 4 Encounters:   02/28/12 125/43   11/25/11 119/47   08/21/11 135/61   04/22/11 129/43         REVIEW OF SYSTEMS:   see above      PHYSICAL EXAM:  Ht 5\' 4"  (1.626 m)  Wt 197 lb (89.359 kg)  BMI 33.8 kg/m2:  GEN: hygiene better than prior  HEENT: Mallampati IV  PULM: comfortable on room air      ASSESSMENT AND PLAN:   DMII (diabetes mellitus, type 2)  - HEMOGLOBIN A1C, HPLC, will adjust based on this, historically has good control  - MetFORMIN HCl 1000 MG Oral Tab; Take 1 tablet by mouth 2 times a day  Dispense: 56 Tab; Refill: 5  - Skin Protectants, Misc. (EUCERIN) External Cream; Apply to affected area(s) 1 to 2 times a day for dry skin   Dispense: 454 g; Refill: 0  - Aspirin 81 MG Oral Tab EC; Take 1 tablet by mouth daily  Dispense: 30 Tab; Refill: 12  Type II Diabetes:  A1C      5.8   02/28/2012  A1C      6.4   11/25/2011  A1C      6.6   08/21/2011\'a0 TOTAL      124   08/21/2011, LDL       79   08/21/2011, HDL       30   08/21/2011\'a0CRE     0.99   02/11/2011, MICROALB     1.70   08/21/2011    Asthma: refilled inhalers    Fatigue: ddx would be OSA. Also Thryoid disease. Will also check CBC.   -referral to sleep clinic  -CBC, TSH today    Health care maintenance  - HIV ANTIGEN AND ANTIBODY SCRN    Refilled the following meds:   Dermatophytosis of foot  -  Clotrimazole 1 % External Cream; Apply to feet 2 times a day for fungal rash  Dispense: 45 g; Refill: 1  - Skin Protectants, Misc. (EUCERIN) External Cream; Apply to affected area(s) 1 to 2 times a day for dry skin  Dispense: 454 g; Refill: 0    Hypertension-- looks good today  - Hydrochlorothiazide 25 MG Oral Tab; Take 1 tablet by mouth once daily  Dispense: 28 Tab; Refill: 6  - Lisinopril 30 MG Oral Tab; take one tablet daily  Dispense: 28 Tab; Refill: 5    Asthma/Allergy  - Loratadine 10 MG Oral Tab; Take 1 tablet by mouth once daily  Dispense: 28 Tab; Refill: 6  - Ranitidine HCl 150 MG Oral Tab; Take 1 tablet by mouth twice a day  Dispense: 56 Tab; Refill: 6  - Albuterol Sulfate HFA 108 (90 BASE) MCG/ACT Inhalation Aero Soln; Inhale 2 puffs by mouth every 6 hours if needed for shortness of breath  Dispense: 1 Inhaler; Refill: 4  - Fluticasone Propionate  HFA (FLOVENT HFA) 220 MCG/ACT Inhalation Aerosol; 1 puff twice a day EVERYDAY. Even if symptoms are no present  Dispense: 1 Inhaler; Refill: 12    HLD (hyperlipidemia)  - Simvastatin 10 MG Oral Tab; 1 TABLET AT BEDTIME  Dispense: 28 Tab; Refill: 6  - Aspirin 81 MG Oral Tab EC; Take 1 tablet by mouth daily  Dispense: 30 Tab; Refill: 12      Patient discussed with Dr Spero Curb    -------------------------------------------  Attending: Barbra Sarks, MD  I discussed the history, exam and medical decision making for this patient with Dr. Bettey Costa APPERSON. I was present in the clinic during the provisions of these services with no other clinical duties. I concur with the management plan as noted above.  -------------------------------------------        The below information was reviewed    ALLERGIES:  Review of patient's allergies indicates no known allergies.    MEDICATIONS:  Current Outpatient Prescriptions   Medication Sig   . Albuterol Sulfate HFA 108 (90 BASE) MCG/ACT Inhalation Aero Soln Inhale 2 puffs by mouth every 6 hours if needed for shortness of breath   . Aspirin 81 MG Oral Tab EC Take 1 tablet by mouth daily   . Clotrimazole 1 % External Cream Apply to feet 2 times a day for fungal rash   . Divalproex Sodium 500 MG Oral Tab EC take 1.5 pills qhs, [facility]   . Fluticasone Propionate  HFA (FLOVENT HFA) 220 MCG/ACT Inhalation Aerosol 1 puff twice a day EVERYDAY. Even if symptoms are no present   . Hydrochlorothiazide 25 MG Oral Tab Take 1 tablet by mouth once daily   . Lisinopril 30 MG Oral Tab take one tablet daily   . Loratadine 10 MG Oral Tab Take 1 tablet by mouth once daily   . MetFORMIN HCl 1000 MG Oral Tab Take 1 tablet by mouth 2 times a day   . Ranitidine HCl 150 MG Oral Tab Take 1 tablet by mouth twice a day   . RISPERIDONE OR 50mg  injection--by [facility]   . Simvastatin 10 MG Oral Tab 1 TABLET AT BEDTIME   . Skin Protectants, Misc. (EUCERIN) External Cream Apply to affected area(s) 1 to 2 times a day for dry skin     No current facility-administered medications for this visit.       PROBLEM LIST:  Patient Active Problem List    Diagnosis Date Noted   .  Health care maintenance [V70.0] 04/22/2011     CV: Aspirin: taking   Lipids: at goal, on statin 08/2011  Glucose: +DM   AAA(m): n/a   CA: PSA/Mammogram: n/a   Colonoscopy: at 52   ID: HIV: low risk, can check one time at next visit   Hepatitis:  Can  check one time at next visit   Syphilis:   TB:   Tdap: 2011   Pvax: 2003   Flu: 11/12, 11/3  Zoster:   Bone/Falls:   EOL:   IPV:       . Asthma [493.00] 01/04/2011   . DEVELOPMENT DELAYS NEC [315.8] 05/21/2010     case manager Jaclyn Prime, cell [phone] is his guardian. Should be called after appointments to update him on any changes  mother [name] can be reached at [phone]   sister Laurena Spies can be reached at work number [phone]       . Type II or unspecified type diabetes mellitus without mention of complication, not stated as uncontrolled [250.00] 04/20/2010     Type II Diabetes:  Hgb A1c (at least Q6 months): A1C      6.4   11/25/2011  A1C      6.6   08/21/2011  A1C      6.2   04/22/2011  Current Meds:  Metformin   Lipids(Q year): LDL       79   08/21/2011  Proteinuria: negative 08/2011  Retinopathy: seen by optho 04/2011- [facility]- outside provider   Neuropathy/Footexam: 11/13, no neuropathy, significant dry skin and dermatophytosis as well as onychomycosis.

## 2012-06-08 LAB — HEMOGLOBIN A1C, HPLC: Hemoglobin A1C: 6.3 % — ABNORMAL HIGH (ref 4.0–6.0)

## 2012-06-09 LAB — HEMOGLOBIN A1C, HPLC

## 2012-08-03 ENCOUNTER — Encounter (HOSPITAL_BASED_OUTPATIENT_CLINIC_OR_DEPARTMENT_OTHER): Payer: No Typology Code available for payment source | Admitting: Internal Medicine

## 2012-08-03 ENCOUNTER — Ambulatory Visit (HOSPITAL_BASED_OUTPATIENT_CLINIC_OR_DEPARTMENT_OTHER): Payer: No Typology Code available for payment source | Admitting: Registered"

## 2012-08-06 NOTE — Progress Notes (Signed)
This encounter was opened in error.

## 2012-08-26 ENCOUNTER — Encounter (HOSPITAL_BASED_OUTPATIENT_CLINIC_OR_DEPARTMENT_OTHER): Payer: Self-pay | Admitting: Internal Medicine

## 2012-08-26 ENCOUNTER — Other Ambulatory Visit (HOSPITAL_BASED_OUTPATIENT_CLINIC_OR_DEPARTMENT_OTHER): Payer: Self-pay | Admitting: Internal Medicine

## 2012-08-26 ENCOUNTER — Ambulatory Visit (HOSPITAL_BASED_OUTPATIENT_CLINIC_OR_DEPARTMENT_OTHER): Payer: No Typology Code available for payment source | Attending: Internal Medicine | Admitting: Internal Medicine

## 2012-08-26 VITALS — BP 130/55 | HR 101 | Temp 97.2°F | Resp 18 | Ht 64.0 in | Wt 198.2 lb

## 2012-08-26 DIAGNOSIS — J45909 Unspecified asthma, uncomplicated: Secondary | ICD-10-CM | POA: Insufficient documentation

## 2012-08-26 DIAGNOSIS — E119 Type 2 diabetes mellitus without complications: Secondary | ICD-10-CM | POA: Insufficient documentation

## 2012-08-26 DIAGNOSIS — B353 Tinea pedis: Secondary | ICD-10-CM | POA: Insufficient documentation

## 2012-08-26 LAB — ALBUMIN/CREATININE RATIO, RANDOM URINE
Albumin (Micro), URN: 0.9 mg/dL
Albumin/Creatinine Ratio, URN: 3.8 mg/g Creat (ref <30)
Creatinine/Unit, URN: 239 mg/dL

## 2012-08-26 MED ORDER — CLOTRIMAZOLE 1 % EX CREA
TOPICAL_CREAM | CUTANEOUS | Status: DC
Start: 2012-08-26 — End: 2015-07-10

## 2012-08-26 MED ORDER — FLUTICASONE PROPIONATE HFA 220 MCG/ACT IN AERO
INHALATION_SPRAY | RESPIRATORY_TRACT | Status: DC
Start: 2012-08-26 — End: 2014-03-16

## 2012-08-26 MED ORDER — ALBUTEROL SULFATE HFA 108 (90 BASE) MCG/ACT IN AERS
INHALATION_SPRAY | RESPIRATORY_TRACT | Status: DC
Start: 2012-08-26 — End: 2013-12-24

## 2012-08-26 NOTE — Progress Notes (Addendum)
Outpatient Clinic  Note      DATE: 08/26/2012     Justin Zavala is a 47 year old male who is here today for follow up on diabetes, htn and weight loss.       INTERVAL HISTORY:  Reports some dietary indiscretion yesterday (and then pretty much all the time on further discussion). And tells me Luisa Hart is concned about his candy intake. He has a person who cooks for him, but doesn't seem to be making wise choices for him. He has gained nearly all of his weight back. He stopped exercising as well. He says he will walk more.    Asthma is "OK".  Using flovent (orange inhaler) 'only sometimes' and the albuterol (blue one) daily.     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     BP OK.     Doesn't check BGs at home.       Wt Readings from Last 4 Encounters:   08/26/12 198 lb 3.2 oz (89.903 kg)   06/05/12 197 lb (89.359 kg)   02/28/12 182 lb 9.6 oz (82.827 kg)   12/23/11 191 lb 11.2 oz (86.955 kg)     BP Readings from Last 4 Encounters:   08/26/12 130/55   02/28/12 125/43   11/25/11 119/47   08/21/11 135/61       REVIEW OF SYSTEMS:  Neg except as above       PHYSICAL EXAM:  BP 130/55  Pulse 101  Temp(Src) 97.2 F (36.2 C) (Temporal)  Resp 18  Ht 5\' 4"  (1.626 m)  Wt 198 lb 3.2 oz (89.903 kg)  BMI 34 kg/m2:  GEN: no acute distress, fairly well groomed, obese  SKIN: +seb derm on face  PULM: no wheeze    a1c 6, and micro albumin wnl    ASSESSMENT AND PLAN:  46 year old man with developmental delay, schizophrenia, dm, htn and asthma, overall doing well.    Asthma: seems he has his inhalers mixed up. Discussed the use of each one. Fluticasone BID, no matter what, albuterol as needed. Used teach back methods, seemed to understand    Weight gain: discussed dietary changes, which he will make. Also will increase walking. Has an appt with Clydie Braun Friday.     DM: continue current meds. Dietary changes as above  Needs to go back to optho by my  charting, he says he will call them      MEDICATIONS:  Current Outpatient Prescriptions   Medication Sig   . Albuterol Sulfate HFA 108 (90 BASE) MCG/ACT Inhalation Aero Soln Inhale 2 puffs by mouth every 6 hours if needed for shortness of breath   . Aspirin 81 MG Oral Tab EC Take 1 tablet by mouth daily   . Clotrimazole 1 % External Cream Apply to feet 2 times a day for fungal rash   . Divalproex Sodium 500 MG Oral Tab EC take 1.5 pills qhs, [facility]   . Fluticasone Propionate  HFA (FLOVENT HFA) 220 MCG/ACT Inhalation Aerosol 1 puff twice a day EVERYDAY. Even if symptoms are no present   . Hydrochlorothiazide 25 MG Oral Tab Take 1 tablet by mouth once daily   . Lisinopril 30 MG Oral Tab take one tablet daily   . Loratadine 10 MG Oral Tab Take 1 tablet by mouth once daily   . MetFORMIN HCl 1000 MG Oral Tab Take 1 tablet by mouth 2  times a day   . Ranitidine HCl 150 MG Oral Tab Take 1 tablet by mouth twice a day   . RISPERIDONE OR 50mg  injection--by [facility]   . Simvastatin 10 MG Oral Tab 1 TABLET AT BEDTIME   . Skin Protectants, Misc. (EUCERIN) External Cream Apply to affected area(s) 1 to 2 times a day for dry skin     No current facility-administered medications for this visit.       PROBLEM LIST:  Patient Active Problem List    Diagnosis Date Noted   . Health care maintenance [V70.0] 04/22/2011     CV: Aspirin: taking   Lipids: at goal, on statin 08/2011  Glucose: +DM   AAA(m): n/a   CA: PSA/Mammogram: n/a   Colonoscopy: at 52   ID: HIV: low risk, can check one time at next visit   Hepatitis:  Can check one time at next visit   Syphilis:   TB:   Tdap: 2011   Pvax: 2003   Flu: 11/12, 11/3  Zoster:   Bone/Falls:   EOL:   IPV:       . Asthma [493.00] 01/04/2011   . DEVELOPMENT DELAYS NEC [315.8] 05/21/2010     case manager Jaclyn Prime, cell [phone] is his guardian. Should be called after appointments to update him on any changes  mother [name] can be reached at [phone]   sister  Laurena Spies can be reached at work number [phone]       . Type II or unspecified type diabetes mellitus without mention of complication, not stated as uncontrolled [250.00] 04/20/2010     Type II Diabetes:  Hgb A1c (at least Q6 months): A1C   Duplicate request   06/05/2012  A1C      6.3   06/05/2012  A1C      5.8   02/28/2012  Current Meds:  Metformin   Lipids(Q year): LDL       79   08/21/2011  Proteinuria: negative 08/2011, ordered 08/2012  Retinopathy: seen by optho 04/2011- [facility]- outside provider   Neuropathy/Footexam: 11/13, no neuropathy, significant dry skin and dermatophytosis as well as onychomycosis.                  -------------------------------------------  Attending: Mardee Postin, MD  I discussed the history, exam and medical decision making for this patient with Dr. Bettey Costa APPERSON. I was present in the clinic during the provisions of these services with no other clinical duties. I concur with the management plan as noted above.  -------------------------------------------        The below information was reviewed    ALLERGIES:  Review of patient's allergies indicates no known allergies.    MEDICATIONS:  Current Outpatient Prescriptions   Medication Sig   . Albuterol Sulfate HFA 108 (90 BASE) MCG/ACT Inhalation Aero Soln Inhale 2 puffs by mouth every 6 hours if needed for shortness of breath   . Aspirin 81 MG Oral Tab EC Take 1 tablet by mouth daily   . Clotrimazole 1 % External Cream Apply to feet 2 times a day for fungal rash   . Divalproex Sodium 500 MG Oral Tab EC take 1.5 pills qhs, [facility]   . Fluticasone Propionate  HFA (FLOVENT HFA) 220 MCG/ACT Inhalation Aerosol 1 puff twice a day EVERYDAY. Even if symptoms are no present   . Hydrochlorothiazide 25 MG Oral Tab Take 1 tablet by mouth once daily   . Lisinopril 30 MG  Oral Tab take one tablet daily   . Loratadine 10 MG Oral Tab Take 1 tablet by mouth once daily   . MetFORMIN HCl 1000 MG Oral Tab Take 1 tablet  by mouth 2 times a day   . Ranitidine HCl 150 MG Oral Tab Take 1 tablet by mouth twice a day   . RISPERIDONE OR 50mg  injection--by [facility]   . Simvastatin 10 MG Oral Tab 1 TABLET AT BEDTIME   . Skin Protectants, Misc. (EUCERIN) External Cream Apply to affected area(s) 1 to 2 times a day for dry skin     No current facility-administered medications for this visit.       PROBLEM LIST:  Patient Active Problem List    Diagnosis Date Noted   . Health care maintenance [V70.0] 04/22/2011     CV: Aspirin: taking   Lipids: at goal, on statin 08/2011  Glucose: +DM   AAA(m): n/a   CA: PSA/Mammogram: n/a   Colonoscopy: at 52   ID: HIV: low risk, can check one time at next visit   Hepatitis:  Can check one time at next visit   Syphilis:   TB:   Tdap: 2011   Pvax: 2003   Flu: 11/12, 11/3  Zoster:   Bone/Falls:   EOL:   IPV:       . Asthma [493.00] 01/04/2011   . DEVELOPMENT DELAYS NEC [315.8] 05/21/2010     case manager Jaclyn Prime, cell [phone] is his guardian. Should be called after appointments to update him on any changes  mother [name] can be reached at [phone]   sister Laurena Spies can be reached at work number [phone]       . Type II or unspecified type diabetes mellitus without mention of complication, not stated as uncontrolled [250.00] 04/20/2010     Type II Diabetes:  Hgb A1c (at least Q6 months): A1C   Duplicate request   06/05/2012  A1C      6.3   06/05/2012  A1C      5.8   02/28/2012  Current Meds:  Metformin   Lipids(Q year): LDL       79   08/21/2011  Proteinuria: negative 08/2011, ordered 08/2012  Retinopathy: seen by optho 04/2011- [facility]- outside provider   Neuropathy/Footexam: 11/13, no neuropathy, significant dry skin and dermatophytosis as well as onychomycosis.

## 2012-08-27 LAB — HEMOGLOBIN A1C, HPLC: Hemoglobin A1C: 6 % (ref 4.0–6.0)

## 2012-08-28 ENCOUNTER — Ambulatory Visit (HOSPITAL_BASED_OUTPATIENT_CLINIC_OR_DEPARTMENT_OTHER): Payer: No Typology Code available for payment source | Admitting: Registered"

## 2012-08-28 ENCOUNTER — Encounter (HOSPITAL_BASED_OUTPATIENT_CLINIC_OR_DEPARTMENT_OTHER): Payer: No Typology Code available for payment source | Admitting: Internal Medicine

## 2012-09-22 ENCOUNTER — Ambulatory Visit (HOSPITAL_BASED_OUTPATIENT_CLINIC_OR_DEPARTMENT_OTHER)
Payer: No Typology Code available for payment source | Attending: Cardiovascular Disease | Admitting: Cardiovascular Disease

## 2012-09-22 VITALS — BP 144/66 | HR 94 | Temp 97.0°F | Ht 64.0 in | Wt 191.0 lb

## 2012-09-22 DIAGNOSIS — Z0000 Encounter for general adult medical examination without abnormal findings: Secondary | ICD-10-CM | POA: Insufficient documentation

## 2012-09-22 NOTE — Progress Notes (Signed)
DATE: 09/22/2012     CHIEF COMPLAINT: Physical    PROBLEM LIST/PMH:  Patient Active Problem List    Diagnosis Date Noted   . Health care maintenance 04/22/2011     CV: Aspirin: taking   Lipids: at goal, on statin 08/2011  Glucose: +DM   AAA(m): n/a   CA: PSA/Mammogram: n/a   Colonoscopy: at 52   ID: HIV: low risk, can check one time at next visit   Hepatitis:  Can check one time at next visit   Syphilis:   TB:   Tdap: 2011   Pvax: 2003   Flu: 11/12, 11/3  Zoster:   Bone/Falls:   EOL:   IPV:       . Asthma 01/04/2011   . DEVELOPMENT DELAYS NEC 05/21/2010     case manager Jaclyn Prime, cell [phone] is his guardian. Should be called after appointments to update him on any changes  mother [name] can be reached at [phone]   sister Laurena Spies can be reached at work number [phone]       . Type II or unspecified type diabetes mellitus without mention of complication, not stated as uncontrolled 04/20/2010     Type II Diabetes:  Hgb A1c (at least Q6 months): A1C   Duplicate request   06/05/2012  A1C      6.3   06/05/2012  A1C      5.8   02/28/2012  Current Meds:  Metformin   Lipids(Q year): LDL       79   08/21/2011  Proteinuria: negative 08/2011, ordered 08/2012  Retinopathy: seen by optho 04/2011- [facility]- outside provider   Neuropathy/Footexam: 11/13, no neuropathy, significant dry skin and dermatophytosis as well as onychomycosis.              INTERVAL HISTORY/HPI:  Mr. 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 He does not have any issues today. He reports being in good health and taking all his medications as prescribed. He has no health concerns today, but would like a physical form filled out.    MEDICATIONS:  Current Outpatient Prescriptions   Medication Sig   . Albuterol Sulfate HFA 108 (90 BASE) MCG/ACT Inhalation Aero Soln Inhale 2 puffs by mouth every 6 hours if needed for shortness of breath   . Aspirin 81 MG Oral Tab EC Take 1 tablet by mouth daily   .  Clotrimazole 1 % External Cream Apply to feet 2 times a day for fungal rash   . Divalproex Sodium 500 MG Oral Tab EC take 1.5 pills qhs, [facility]   . Fluticasone Propionate  HFA (FLOVENT HFA) 220 MCG/ACT Inhalation Aerosol 1 puff twice a day EVERYDAY. Even if symptoms are no present   . Hydrochlorothiazide 25 MG Oral Tab Take 1 tablet by mouth once daily   . Lisinopril 30 MG Oral Tab take one tablet daily   . Loratadine 10 MG Oral Tab Take 1 tablet by mouth once daily   . MetFORMIN HCl 1000 MG Oral Tab Take 1 tablet by mouth 2 times a day   . Ranitidine HCl 150 MG Oral Tab Take 1 tablet by mouth twice a day   . RISPERIDONE OR 50mg  injection--by [facility]   . Simvastatin 10 MG Oral Tab 1 TABLET AT BEDTIME   . Skin Protectants, Misc. (EUCERIN) External Cream Apply to affected area(s) 1 to 2 times a day for dry skin  No current facility-administered medications for this visit.       REVIEW OF SYSTEMS:  GEN: Denies fever, chills  HEENT: Denies HA, nausea  CV: Denies chest pain, palpitations  RESP: Denies SOB  GI: Denies heartburn, abdominal pain  GU: Denies urinary frequency    PHYSICAL EXAM:  BP 144/66  Pulse 94  Temp(Src) 97 F (36.1 C) (Temporal)  Ht 5\' 4"  (1.626 m)  Wt 191 lb (86.637 kg)  BMI 32.77 kg/m2    GEN: NAD, AOx3   HEENT: PERRLA, EOMI, no LAD (suboccipital, submandibular, pre/post auricular, supraclavicular, or axillary  SKIN: No rashes, no nail changes   CV: RRR s1 & s2 +, with no R/M/G  RESP: CTA B, without crackles  ABD: Soft NT/ND BS+  MUSK: Normal muscle bulk   EXT: No edema  Neuro: CN II-XII in tact, normal gait      LAB RESULTS:      Orders Only on 08/26/12   HEMOGLOBIN A1C, HPLC       Result Value Range    Hemoglobin A1C by HPLC 6.0  4.0 - 6.0 %   URINE SCREEN, MICROALBUMINURIA       Result Value Range    Creatinine/Unit, URN 239      Albumin (Micro), URN 0.90      Albumin/Creatinine Ratio,URN 3.8  <30 mg/g Creat       ASSESSMENT AND PLAN:    47 yo man presenting  for physical exam for the special [place].    # Physical Exam:  Mr. Pelzer appears to be in good health. He has well controlled diabetes. His schizophrenia is also well controlled today. He has nothing on physical exam which should preclude him from participating in the special Olympics. I reviewed his medications today and nothing there makes his participation limited. I have advised him to drink sugar less drinks while participating and to stay well hydrated.    RTC:  PRN    This patient was discussed with Dr. Jolayne Haines      This Note Written By    Darci Current. Semaj Coburn M.D.  Department of Internal Medicine, [facility]  Pager: [phone]

## 2012-10-02 ENCOUNTER — Ambulatory Visit (HOSPITAL_BASED_OUTPATIENT_CLINIC_OR_DEPARTMENT_OTHER): Payer: No Typology Code available for payment source | Admitting: Registered"

## 2012-10-06 IMAGING — CT CT ABD-PELV W/ CM
1 of 2 series · 15 of 32 positions shown, 19 images · IV contrast (isovue)
Comparison: None

REASON FOR EXAM: COMMENTS:

PROCEDURE:     RASHAAN - RASHAAN ABDOMEN / PELVIS W  - January 25, 2010  [DATE]
RESULT:      History: A pain in the right lower quadrant
TECHNIQUE: Multiple axial images of the abdomen and pelvis were performed
from the lung bases to the pubic symphysis, with p.o. contrast and with 100
ml of Isovue 370 intravenous contrast.

[Series 2: soft tissue · axial · 0.69mm/px · z∈[-967,-562]mm · 15 of 147 slices shown, 19 images]
[im 6/147  soft-tissue]
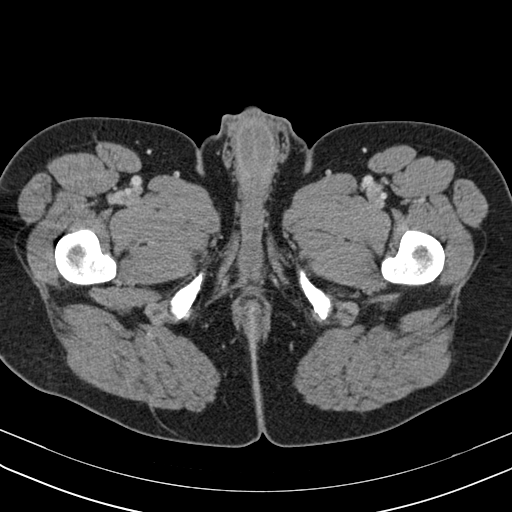
[im 6/147  bone]
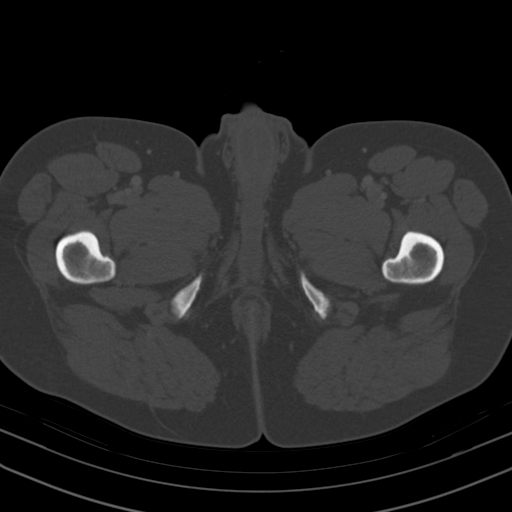
[im 18/147  soft-tissue]
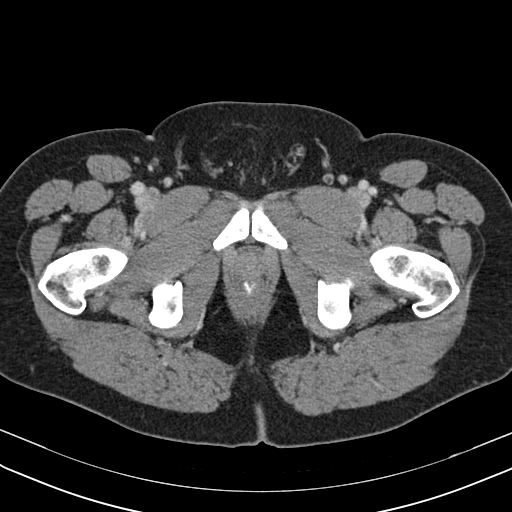
[im 30/147  soft-tissue]
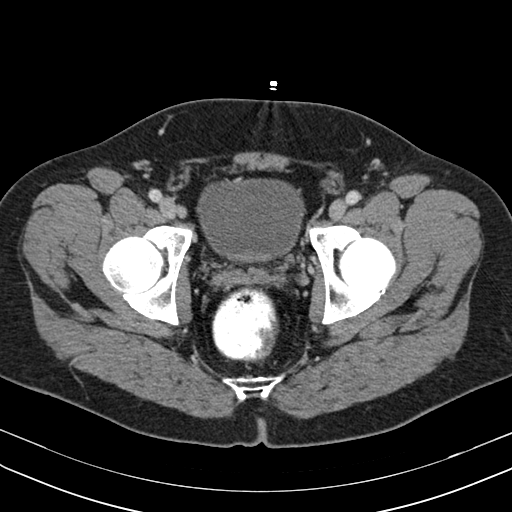
[im 41/147  soft-tissue]
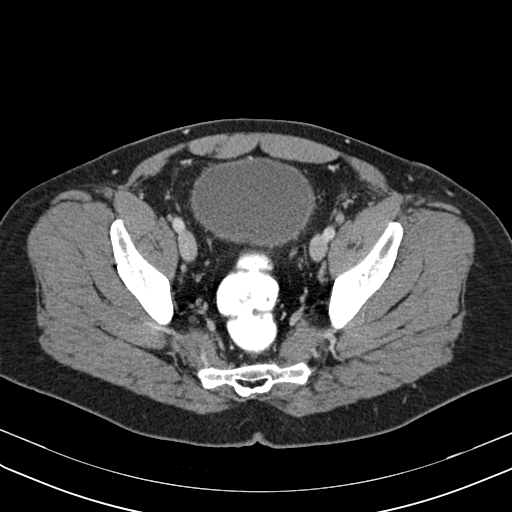
[im 53/147  soft-tissue]
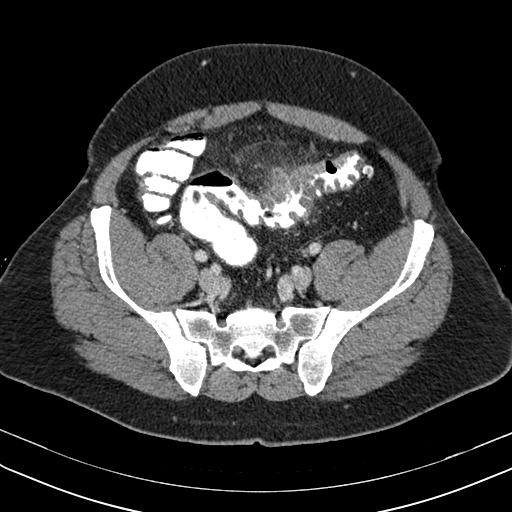
[im 65/147  soft-tissue]
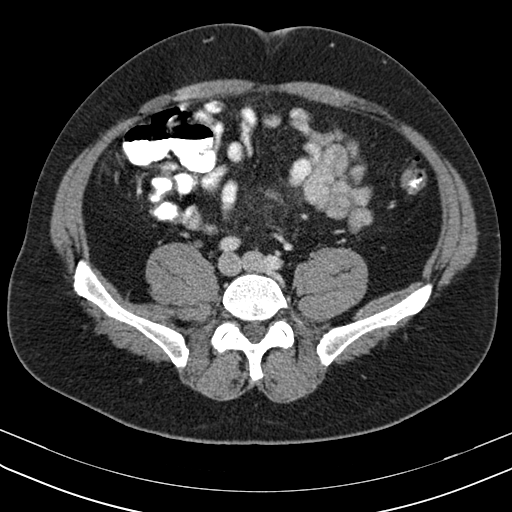
[im 76/147  soft-tissue]
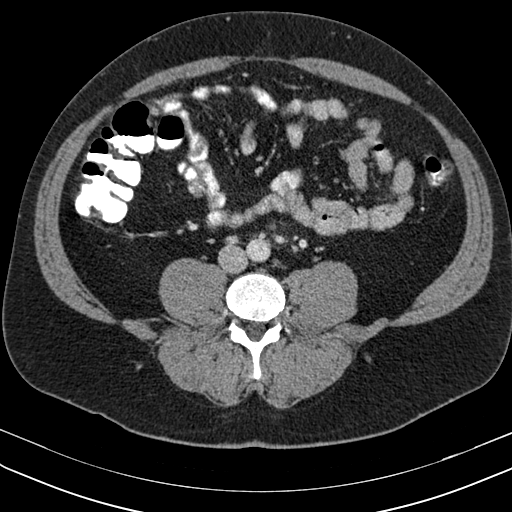
[im 82/147  soft-tissue]
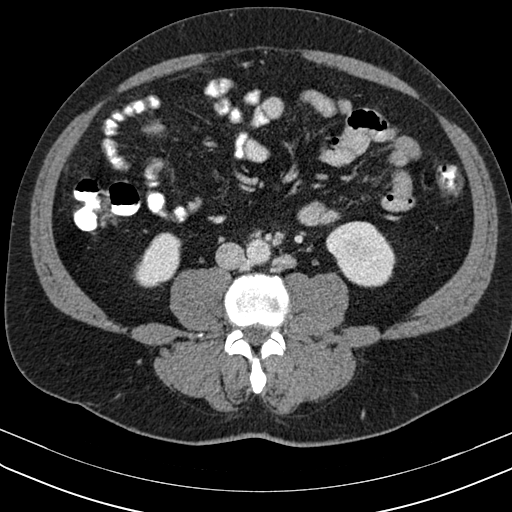
[im 94/147  soft-tissue]
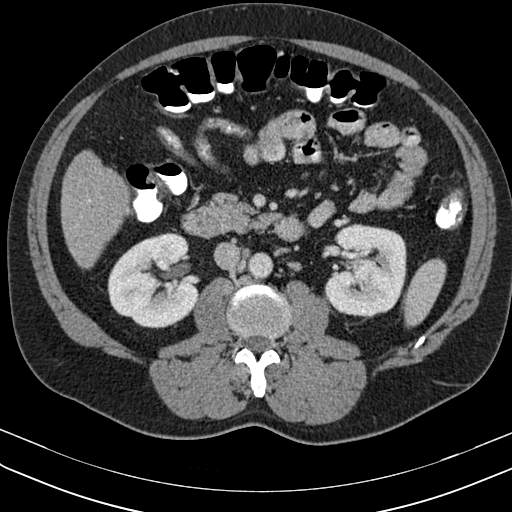
[im 94/147  bone]
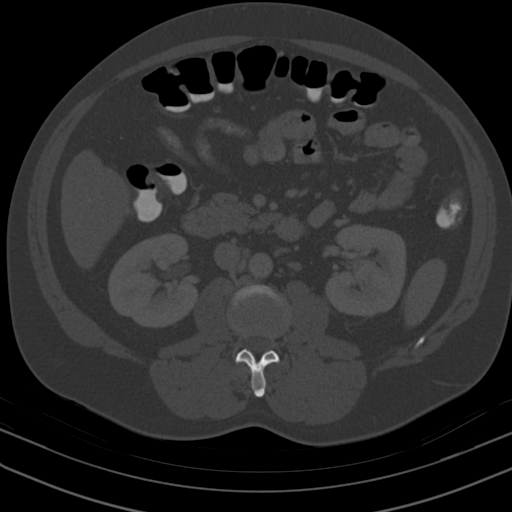
[im 106/147  soft-tissue]
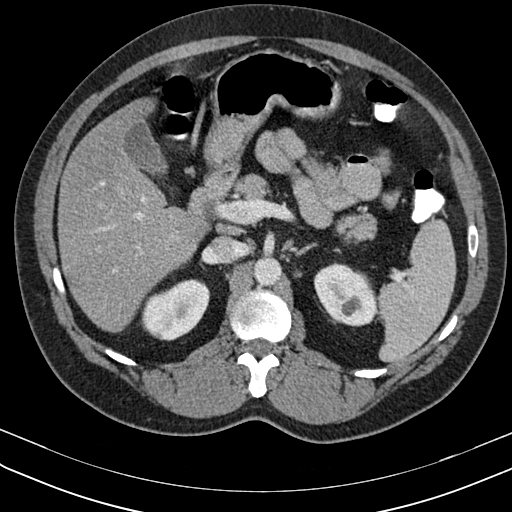
[im 117/147  soft-tissue]
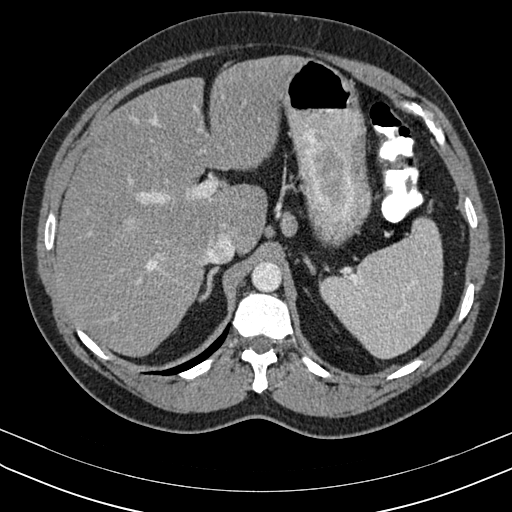
[im 123/147  lung]
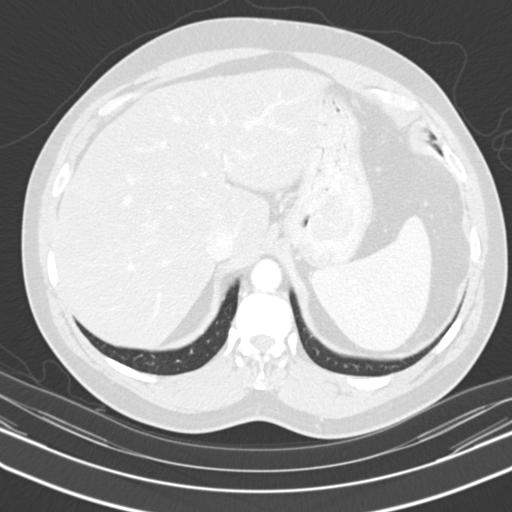
[im 129/147  soft-tissue]
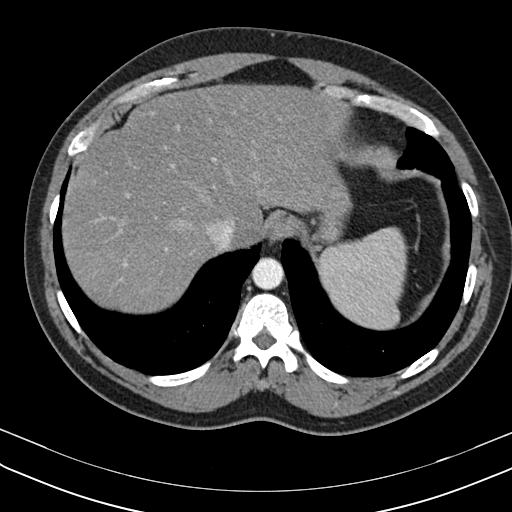
[im 129/147  lung]
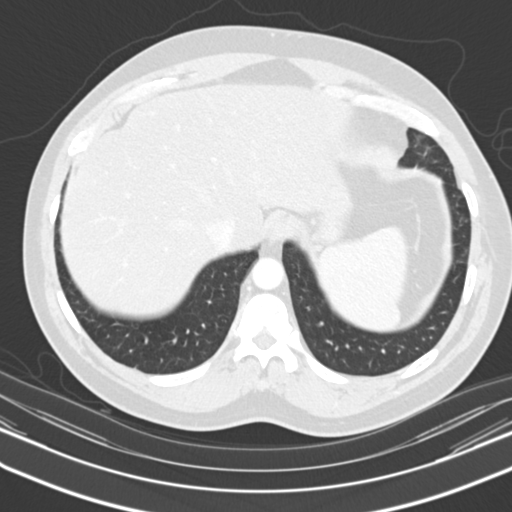
[im 135/147  lung]
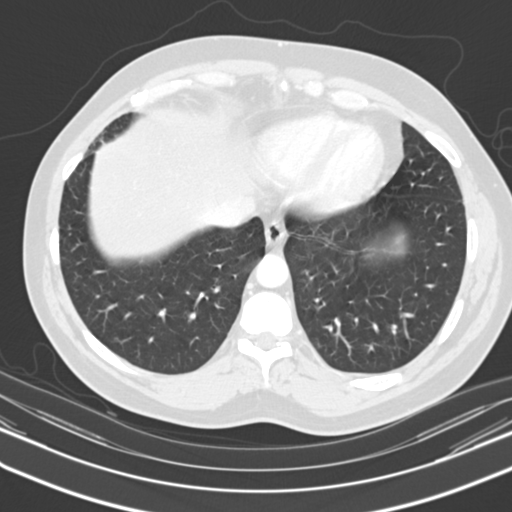
[im 141/147  soft-tissue]
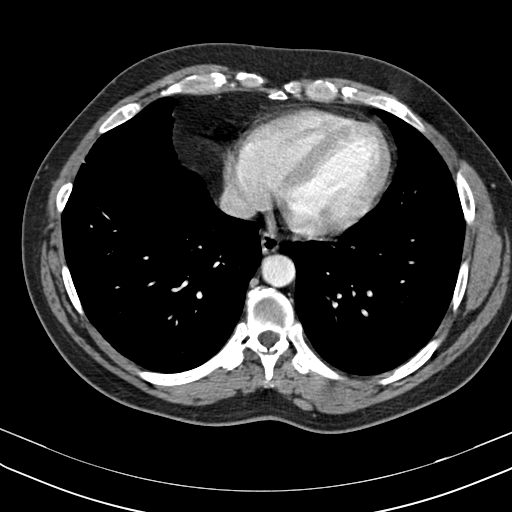
[im 141/147  lung]
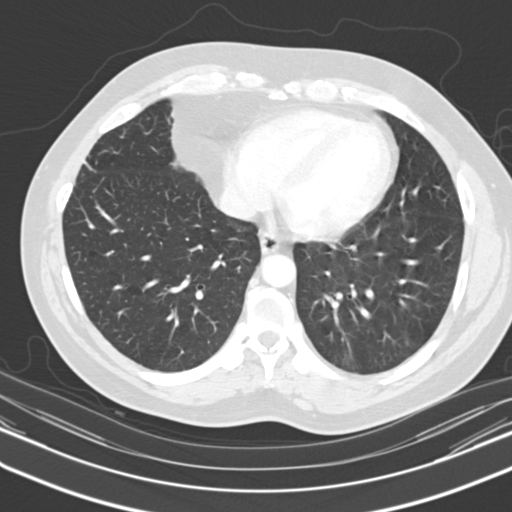

[15 of 32 positions shown; findings below may reference images not displayed]

FINDINGS: The lung bases are clear. There is no pneumothorax. The heart size is normal.

The liver is diffusely low in attenuation likely secondary to hepatic
steatosis. There is no intrahepatic or extrahepatic biliary ductal
dilatation. The gallbladder is unremarkable. The spleen demonstrates no
focal abnormality. The kidneys, adrenal glands, and pancreas are normal. The
bladder is unremarkable.

The stomach, duodenum, small intestine, and large intestine demonstrate no
contrast extravasation or dilatation. There is thickening of the sigmoid
colon with perisigmoidal inflammatory changes. There are scattered
diverticula in the descending colon and sigmoid colon. There no definite
diverticula in the region of the bowel wall thickening. There is a normal
caliber appendix in the right lower quadrant without periappendiceal
inflammatory changes. There is no pneumoperitoneum, pneumatosis, or portal
venous gas. There is no abdominal or pelvic free fluid. There is no
lymphadenopathy.

The abdominal aorta is normal in caliber .

The osseous structures are unremarkable.
IMPRESSION: There is thickening of the sigmoid colon with perisigmoidal inflammatory
changes. There are scattered diverticula in the descending colon and sigmoid
colon. There no definite diverticula in the region of the bowel wall
thickening. This may represent diverticulitis versus colitis( which may be
secondary to an infectious, inflammatory or ischemic etiology). There is no
perisigmoidal fluid collection to suggest an abscess.

These findings were communicated to Dr. Moatshe on 01/25/2010 at 4555 hours .

## 2012-10-12 ENCOUNTER — Ambulatory Visit (HOSPITAL_BASED_OUTPATIENT_CLINIC_OR_DEPARTMENT_OTHER): Payer: No Typology Code available for payment source | Admitting: Registered"

## 2012-10-26 ENCOUNTER — Encounter (HOSPITAL_BASED_OUTPATIENT_CLINIC_OR_DEPARTMENT_OTHER): Payer: No Typology Code available for payment source | Admitting: Registered"

## 2012-11-09 ENCOUNTER — Encounter (HOSPITAL_BASED_OUTPATIENT_CLINIC_OR_DEPARTMENT_OTHER): Payer: No Typology Code available for payment source | Admitting: Registered"

## 2012-12-02 ENCOUNTER — Other Ambulatory Visit: Payer: Self-pay | Admitting: Cardiovascular Disease

## 2012-12-02 MED ORDER — LISINOPRIL 30 MG OR TABS
30.0000 mg | ORAL_TABLET | Freq: Every day | ORAL | Status: DC
Start: 2012-12-02 — End: 2013-05-14

## 2012-12-02 MED ORDER — METFORMIN HCL 1000 MG OR TABS
1000.0000 mg | ORAL_TABLET | Freq: Two times a day (BID) | ORAL | Status: DC
Start: 2012-12-02 — End: 2013-05-14

## 2012-12-03 ENCOUNTER — Encounter (HOSPITAL_BASED_OUTPATIENT_CLINIC_OR_DEPARTMENT_OTHER): Payer: No Typology Code available for payment source | Admitting: Internal Medicine

## 2012-12-25 ENCOUNTER — Encounter (HOSPITAL_BASED_OUTPATIENT_CLINIC_OR_DEPARTMENT_OTHER): Payer: Self-pay | Admitting: Internal Medicine

## 2012-12-25 ENCOUNTER — Ambulatory Visit (HOSPITAL_BASED_OUTPATIENT_CLINIC_OR_DEPARTMENT_OTHER): Payer: No Typology Code available for payment source | Admitting: Registered"

## 2012-12-25 ENCOUNTER — Other Ambulatory Visit (HOSPITAL_BASED_OUTPATIENT_CLINIC_OR_DEPARTMENT_OTHER): Payer: Self-pay

## 2012-12-25 ENCOUNTER — Ambulatory Visit (HOSPITAL_BASED_OUTPATIENT_CLINIC_OR_DEPARTMENT_OTHER): Payer: No Typology Code available for payment source | Attending: Internal Medicine | Admitting: Internal Medicine

## 2012-12-25 VITALS — BP 140/44 | HR 92 | Temp 96.8°F | Resp 16 | Ht 64.0 in | Wt 187.0 lb

## 2012-12-25 VITALS — Ht 64.0 in | Wt 186.0 lb

## 2012-12-25 DIAGNOSIS — E119 Type 2 diabetes mellitus without complications: Secondary | ICD-10-CM | POA: Insufficient documentation

## 2012-12-25 DIAGNOSIS — Z713 Dietary counseling and surveillance: Secondary | ICD-10-CM | POA: Insufficient documentation

## 2012-12-25 LAB — CBC, DIFF

## 2012-12-25 LAB — HEMOGLOBIN A1C, HPLC

## 2012-12-25 LAB — VALPROIC ACID (DEPAKENE)

## 2012-12-25 NOTE — Progress Notes (Addendum)
[facility] ADULT MEDICINE CLINIC  OUTPATIENT NOTE  DATE: 12/25/2012  ID/CC: 47 year old  INTERVAL HISTORY  ------------------------------------------------------------------------  Justin Zavala is a 47 y/o male who was a prior patient of Dr. Garnet Zavala with developmental delays and DM who presents for re-establishment of care.  His main concern today is his diabetes and making sure that it is well controlled.  Patient has lost weight and is taking his medication.  He states he walks once per day and bowls on the weekends.  He is trying to eat more vegetables.   Pertinent negatives include no CP, no SOB, no abdominal pain, no fevers/chills, no diarrhea, no blood in the stool, no dysuria, no LEE and no new rashes.   No problems with asthma.   He denies any other complaints    Past Medical History  1. Obesity-has lost weight  2. DM  3. Asthma-states he has more problems in teh winter    PROBLEM LIST  ------------------------------------------------------------------------  Patient Active Problem List    Diagnosis Date Noted   . Health care maintenance 04/22/2011     CV: Aspirin: taking   Lipids: at goal, on statin 08/2011  Glucose: +DM   AAA(m): n/a   CA: PSA/Mammogram: n/a   Colonoscopy: at 52   ID: HIV: low risk, can check one time at next visit   Hepatitis:  Can check one time at next visit   Syphilis:   TB:   Tdap: 2011   Pvax: 2003   Flu: 11/12, 11/3  Zoster:   Bone/Falls:   EOL:   IPV:       . Asthma 01/04/2011   . DEVELOPMENT DELAYS NEC 05/21/2010     case manager Jaclyn Prime, cell [phone] is his guardian. Should be called after appointments to update him on any changes  mother [name] can be reached at [phone]   sister Laurena Spies can be reached at work number [phone]       . Type II or unspecified type diabetes mellitus without mention of complication, not stated as uncontrolled 04/20/2010     Type II Diabetes:  Hgb A1c (at least Q6 months): A1C   Duplicate request   06/05/2012  A1C      6.3   06/05/2012  A1C      5.8    02/28/2012  Current Meds:  Metformin   Lipids(Q year): LDL       79   08/21/2011  Proteinuria: negative 08/2011, ordered 08/2012  Retinopathy: seen by optho 04/2011- [facility]- outside provider   Neuropathy/Footexam: 11/13, no neuropathy, significant dry skin and dermatophytosis as well as onychomycosis.            MEDICATIONS  ------------------------------------------------------------------------  Current Outpatient Prescriptions   Medication Sig   . Albuterol Sulfate HFA 108 (90 BASE) MCG/ACT Inhalation Aero Soln Inhale 2 puffs by mouth every 6 hours if needed for shortness of breath   . Aspirin 81 MG Oral Tab EC Take 1 tablet by mouth daily   . Clotrimazole 1 % External Cream Apply to feet 2 times a day for fungal rash   . Divalproex Sodium 500 MG Oral Tab EC take 1.5 pills qhs, FROM [facility]   . Fluticasone Propionate  HFA (FLOVENT HFA) 220 MCG/ACT Inhalation Aerosol 1 puff twice a day EVERYDAY. Even if symptoms are no present   . Hydrochlorothiazide 25 MG Oral Tab Take 1 tablet by mouth once daily   . Lisinopril 30 MG Oral Tab Take 1 tablet (  30 mg) by mouth daily.   . Loratadine 10 MG Oral Tab Take 1 tablet by mouth once daily   . MetFORMIN HCl 1000 MG Oral Tab Take 1 tablet (1,000 mg) by mouth 2 times a day.   . Ranitidine HCl 150 MG Oral Tab Take 1 tablet by mouth twice a day   . RISPERIDONE OR 50mg  injection--by [facility]   . Simvastatin 10 MG Oral Tab 1 TABLET AT BEDTIME   . Skin Protectants, Misc. (EUCERIN) External Cream Apply to affected area(s) 1 to 2 times a day for dry skin     No current facility-administered medications for this visit.     Allergies  Review of patient's allergies indicates:  No Known Allergies  FAMILY HISTORY  ------------------------------------------------------------------------  Mother-DM  Brother-Alcoholic  He states multiple people on his mother's side are alcoholics.      SOCIAL  HISTORY  ------------------------------------------------------------------------  Lives next to his sister by himself.  He has a caregiver who helps him on M/Th/Sa.  His caregiver prepares his food and prepares him healthy meals.  She does help him clean up.  She also helps with his medications and medi-set.     Tobacco: No; he did but his sister made him quit after one month  EtOH: No  Drugs: No    PHYSICAL EXAM  ------------------------------------------------------------------------  Vitals:   BP 140/44  Pulse 92  Temp(Src) 96.8 F (36 C) (Temporal)  Resp 16  Ht 5\' 4"  (1.626 m)  Wt 187 lb (84.823 kg)  BMI 32.08 kg/m2  Wt Readings from Last 3 Encounters:   12/25/12 187 lb (84.823 kg)   09/22/12 191 lb (86.637 kg)   08/26/12 198 lb 3.2 oz (89.903 kg)     Gen: Pleasant, sitting in chair, in NAD, quite disheveled however  HEENT: NCAT, MMMI, oropharynx clear without erythema, no cervical LAD.  No thyromegally.  Poor dentition.     Lungs:CTAB no w/r/r  Abdomen: Obese  CV: RRR, no m/r/g, no JVD, normal S1S2  Ext: No C/C/E.  Feet as described in the problem list  Skin: No rashes.  +dry skin on the cheeks, nose  Neuro: AxOx3    RELEVANT LABS/STUDIES  ------------------------------------------------------------------------  A1c pending    ASSESSMENT/PLAN  ------------------------------------------------------------------------  Mr. Sweetser is a 47 y/o male who was a prior patient of Dr. Garnet Zavala with developmental delays and DM who presents for re-establishment of care.     #DM: Patient well controlled as per last visit.  Has has lost 4lbs since last visit with control of diet and improved activity.  This works out to slightly more than 1lb per month and is appropriate.  He states he is taking his meds and uses his medi-set.    -repeat A1c  -optho referral  -foot exam done today  -continue to see nutrition  -continue metformin at current dose    #Developmental delay: Patient disheveled, smells dirty.  Does have a  caregiver and lives next to his sister.  I am not certain whether this is neglect (seems unlikely given how well he takes his medications and all of his activities) vs a compromise in giving him independence over his ADLs.  As he was a prior patient of Justin Zavala, I will contact her for further insight and follow up at the next visit.       RTC: 6  months  Discussed with: Justin Zavala    -------------------------------------------  Attending: Barbra Sarks, MD  I discussed the  history, exam and medical decision making for this patient with Dr. Rica Zavala, [facility] Justin Zavala. I was present in the clinic during the provisions of these services with no other clinical duties. I concur with the management plan as noted above.  -------------------------------------------

## 2012-12-25 NOTE — Addendum Note (Signed)
Addended by: Josefa Half VL on: 12/25/2012 03:33 PM     Modules accepted: Level of Service

## 2012-12-28 NOTE — Progress Notes (Signed)
Nutrition Note    Clinic:  Adult Medicine    Note Type: Reassessment    Referred by: Dr Kimberlee Nearing  Same day verbal order read back: No  Date referred: 03/2008    Reason For Visit: Weight management    An interpreter was not needed for the visit.    Assessment    Medical Diagnosis: Obesity, Hypertension, Hyperlipidemia, Esophogeal Reflux and Developmental Delay, Schizophrenia, diabetes    Weight History:   Vitals Height Weight BMI (kg/m2)   12/25/2012 5\' 4"  186 lb 31.93     Adult BMI Classification: obese categ. I (30- 34.9)    Vitals 06/05/2012   Weight 197 lb     Initial wt: 189.4lb (04/2008)    Labs:  DIABETES 02/28/2012 06/05/2012   A1C 5.8 6.3 (H)     Self monitored blood glucose: does not monitor    Current Outpatient Prescriptions   Medication Sig   . Albuterol Sulfate HFA 108 (90 BASE) MCG/ACT Inhalation Aero Soln Inhale 2 puffs by mouth every 6 hours if needed for shortness of breath   . Aspirin 81 MG Oral Tab EC Take 1 tablet by mouth daily   . Clotrimazole 1 % External Cream Apply to feet 2 times a day for fungal rash   . Divalproex Sodium 500 MG Oral Tab EC take 1.5 pills qhs, [facility]   . Fluticasone Propionate  HFA (FLOVENT HFA) 220 MCG/ACT Inhalation Aerosol 1 puff twice a day EVERYDAY. Even if symptoms are no present   . Hydrochlorothiazide 25 MG Oral Tab Take 1 tablet by mouth once daily   . Lisinopril 30 MG Oral Tab Take 1 tablet (30 mg) by mouth daily.   . Loratadine 10 MG Oral Tab Take 1 tablet by mouth once daily   . MetFORMIN HCl 1000 MG Oral Tab Take 1 tablet (1,000 mg) by mouth 2 times a day.   . Ranitidine HCl 150 MG Oral Tab Take 1 tablet by mouth twice a day   . RISPERIDONE OR 50mg  injection--by [facility]   . Simvastatin 10 MG Oral Tab 1 TABLET AT BEDTIME   . Skin Protectants, Misc. (EUCERIN) External Cream Apply to affected area(s) 1 to 2 times a day for dry skin     No current facility-administered medications for this visit.     Vitamins/Minerals/Herbal  Supplements: not discussed    Nutrition requirements: not discussed    Barriers to adequate intake: None    GI tolerance: None    History   Substance Use Topics   . Smoking status: Never Smoker    . Smokeless tobacco: Never Used   . Alcohol Use: No     Activity: [place] bowling on Saturdays and dancing on Tuesdays.  He tries to walk most days and makes goal to walk for an hour every day    Special/alternative diet: None    Food intolerances/allergies: None    Diet History: care giver making his meals    B:1pkt oatmeal made w/ water, banana and milk  L:pbj sandwich  D: spaghetti 1/2 plate and water or rice mixed w/ ground beef, carrots and potatoes, he does not really like broccoli but care giver makes him eat it when she makes it.  He does not think she would buy different vegetables if he asked  Beverages: water, diet pop    Limiting fast food/meals out to 1x/mo; no chips or snacks    Information from Patient: Pt's long time case manager Luisa Hart  is no longer going to be taking care of him.  Harlow is meeting his new CM next week.      Assessment Summary: ~11lb weight loss since last appt. Pt portions appear smaller and he seems to be eating less snack foods/fast food.  Could benefit from increasing vegetables at dinner      Nutrition Diagnosis: Inability to manage self care  related to develomental delay evidenced by difficulty sticking to recommendations.    Diagnosis Reassessment:  _ Resolved (nutrition problem no longer exists)   x_ Improvement shown (Nutrition problem still exists).   _Unresolved no improvement shown:    _ No longer appropriate (change in condition)    Interventions:    Commend him on efforts to limit portions and choose healthier foods  Encourage him to continue to include exercise and agree w/ plan to walk every day  Discussed what vegetables to buy for dinner    Education Provided:  Please refer to Education section of chart     Patient stated goals: weight loss    Nutrition topics taught:  vegetables to buy    Education materials provided: list of vegetables    Time spent teaching: 15 mins    Plan/Recommendations    Follow up: 6 weeks    Plan:    Buy vegetables you like to have with dinner  Walk every day    Total Time Spent: 45 mins    [name], MS, RD, CD, CDE  [facility]  Box [phone]  [address] [place], [place] [address]  Pager: [phone]

## 2012-12-31 ENCOUNTER — Other Ambulatory Visit: Payer: Self-pay | Admitting: Internal Medicine

## 2012-12-31 ENCOUNTER — Other Ambulatory Visit: Payer: Self-pay | Admitting: Registered"

## 2012-12-31 MED ORDER — LORATADINE 10 MG OR TABS
10.0000 mg | ORAL_TABLET | Freq: Every day | ORAL | Status: DC
Start: 2012-12-31 — End: 2013-02-23

## 2012-12-31 MED ORDER — HYDROCHLOROTHIAZIDE 25 MG OR TABS
25.0000 mg | ORAL_TABLET | Freq: Every day | ORAL | Status: DC
Start: 2012-12-31 — End: 2013-02-23

## 2012-12-31 MED ORDER — RANITIDINE HCL 150 MG OR TABS
150.0000 mg | ORAL_TABLET | Freq: Two times a day (BID) | ORAL | Status: DC
Start: 2012-12-31 — End: 2013-02-24

## 2012-12-31 NOTE — Telephone Encounter (Signed)
Last bmp 02/2011

## 2013-01-01 ENCOUNTER — Other Ambulatory Visit (HOSPITAL_BASED_OUTPATIENT_CLINIC_OR_DEPARTMENT_OTHER): Payer: Self-pay | Admitting: Internal Medicine

## 2013-01-01 MED ORDER — SIMVASTATIN 10 MG OR TABS
ORAL_TABLET | ORAL | Status: DC
Start: 2013-01-01 — End: 2013-07-28

## 2013-01-01 NOTE — Telephone Encounter (Signed)
Refill Requested via [facility], Lf 8/14  :    Comment/Action seen at [facility]

## 2013-01-01 NOTE — Telephone Encounter (Signed)
This medication is outside of the Refill Center's protocols: simvastatin previously prescribed by Dr. Vernell Barrier and lipids not addressed at most recent visit; okay to refill in Dr. Rosezetta Schlatter name?     Please sign and close the encounter if you approve.    If this medication is denied please have your staff inform the patient and schedule an appointment if necessary.

## 2013-01-06 ENCOUNTER — Encounter (HOSPITAL_BASED_OUTPATIENT_CLINIC_OR_DEPARTMENT_OTHER): Payer: Self-pay | Admitting: Optometry

## 2013-01-06 ENCOUNTER — Ambulatory Visit (INDEPENDENT_AMBULATORY_CARE_PROVIDER_SITE_OTHER): Payer: No Typology Code available for payment source | Admitting: Optometry

## 2013-01-06 NOTE — Progress Notes (Signed)
ROS:   Is pain part of the reason you came to clinic today? No  Wears glasses: YES.  Date of last prescription: 2012  Wears contact lenses:NO  Eye symptoms: none  Review of systems is negative except for high blood pressure, asthma, developmental delay and diabetes  The past medical, family and social history in the history section of the EMR has been confirmed with the patient: Yes

## 2013-01-06 NOTE — Patient Instructions (Addendum)
Patient Instructions:   Do the following 3 steps twice daily   1) Warm compress over both eyes: 3-5 minutes. You can wrap a hot washcloth around a commercially available heat pack, around a microwaved potato, hard boiled egg, rice in a bag, water in a bag. Do check the temperature first, so you don't scald your eyelid.   2) Eyelid massage or eyelid expression: This is what I showed you today in clinic. Clean fingers, close eyelid, push on eyelash base all the way across the eye lid (nose to ear).   3) Eyelid scrub: Mix 1 cup warm water and a 1 tsp of baby shampoo in small glass. Use clean fingers or washcloth to scrub lids and lashes, paying particular attention to the base of the lashes.   Also use:   Artificial tears (nothing that says redness reliever), one drop four to six times a day in both eyes.   Gel at night - put 1/4 inch strip on fingertip and swipe into bottom of lid before bed.  Vision will be blurry after instillation for at least 20 minutes. (Genteal Gel, Refresh PM, Systane gel to name a few)  Take fish oil or flax seed capsules or oil - 2000 to 4000 mg daily, this can help to unblock the oil glands. Ask your primary care physician about dosage if on a blood thinner.      Education:   Blepharitis is inflammation of the eyelid margin, often caused by blocked oil glands.   It can be treated, but typically is a chronic condition, with periods of exacerbation and remission.   Normally, oil is produced by meibomian glands and gets released into the tear film to help prevent tear evaporation. When the oil glands are blocked, not enough gets released into the tears and they evaporate more quickly; this contributes to dry eyes. Also, the oil that does get released is inflammatory, and this also contributes to irritation.     Thank you for coming in today.   During today's visit we reviewed only your ophthalmology (eye-related) medications.   Please follow up with your primary care provider for any questions  regarding other medications.   If your eyes were dilated during today's visit, the average dilation will last 4 to 6 hours and may impact your overall vision during this period. Please use caution during this period.   If you need to schedule or change a follow up appointment, please call [phone].   Our phone lines are open 7:00am to 8:00pm Monday through Saturdays and 9:00am to 5:30pm on Sundays.

## 2013-01-06 NOTE — Progress Notes (Signed)
Very difficult exam, patient fixates on targets or on directions for only a split second.  Tends to close eyes when light is shining in them, only one view of left posterior pole, enough to assess that macular is grossly normal and to assess size of CD ratio.  Repeated MRx twice and tried retinoscopy.  Refer to [facility] for possible photography of posterior pole or maybe repeat dilation with BIO assessment.

## 2013-01-14 ENCOUNTER — Encounter (HOSPITAL_BASED_OUTPATIENT_CLINIC_OR_DEPARTMENT_OTHER): Payer: No Typology Code available for payment source

## 2013-01-14 NOTE — Addendum Note (Signed)
Addended by: Jamison Neighbor on: 01/14/2013 08:02 AM     Modules accepted: Orders

## 2013-02-05 ENCOUNTER — Encounter (HOSPITAL_BASED_OUTPATIENT_CLINIC_OR_DEPARTMENT_OTHER): Payer: No Typology Code available for payment source | Admitting: Registered"

## 2013-02-10 ENCOUNTER — Encounter (HOSPITAL_BASED_OUTPATIENT_CLINIC_OR_DEPARTMENT_OTHER): Payer: Self-pay | Admitting: Internal Medicine

## 2013-02-11 ENCOUNTER — Ambulatory Visit (HOSPITAL_BASED_OUTPATIENT_CLINIC_OR_DEPARTMENT_OTHER): Payer: No Typology Code available for payment source | Attending: Internal Medicine | Admitting: Internal Medicine

## 2013-02-11 ENCOUNTER — Encounter (HOSPITAL_BASED_OUTPATIENT_CLINIC_OR_DEPARTMENT_OTHER): Payer: Self-pay | Admitting: Internal Medicine

## 2013-02-11 VITALS — BP 146/49 | HR 93 | Temp 96.8°F | Resp 18 | Ht 64.0 in | Wt 188.8 lb

## 2013-02-11 DIAGNOSIS — Z23 Encounter for immunization: Secondary | ICD-10-CM | POA: Insufficient documentation

## 2013-02-11 DIAGNOSIS — E119 Type 2 diabetes mellitus without complications: Secondary | ICD-10-CM | POA: Insufficient documentation

## 2013-02-11 DIAGNOSIS — Z1159 Encounter for screening for other viral diseases: Secondary | ICD-10-CM | POA: Insufficient documentation

## 2013-02-11 NOTE — Progress Notes (Signed)
VACCINE SCREENING/ORDER WHEN CONTRAINDICATIONS PRESENT    Order date: 02/11/2013  Ordering provider: Jearld Adjutant  Clinic stock used: YES   Interpreter used?: None  Vaccine information sheet(s) discussed, patient/parent/guardian verbalized understanding? YES   Vaccines given: INFLUENZA VACCINE  VIS given 02/11/2013 by Marliss Czar, MA .    SCREENING: INACTIVATED VACCINES  NO 1. Do you have a high fever, severe cold-like symptoms or serious infection?  NO 2. Do you have any food allergies such as eggs, chicken, chicken feathers, chicken dander, or gelatin?  NO 3. Do you have any allergies to neomycin, streptomycin, or polymyxin?   NO 4. Have you had any severe reaction to a vaccine in the past such as a seizure, a convulsion from a high fever, or a paralysis problem called Guillain-Barre Syndrome? If so, which vaccine(s):     If the patient/parent/or guardian answered "yes" to any of the above questions, consult with the provider to review the responses prior to administering vaccination. Parents with a contraindication to immunization will not receive the immunization without a specific physician order to vaccinate the patient and discussion of risk and benefits related to the contraindication.     Physician Order for Administration of Vaccine(s) When Contraindications Are Present  I have reviewed the existence in this patient's health history of possible contraindication(s) to administration of vaccine. The benefits to this patient outweigh the potential risks of immunization. Administer vaccine(s):  Marliss Czar, 02/11/2013 4:04 PM MA.

## 2013-02-11 NOTE — Progress Notes (Addendum)
[facility]  OUTPATIENT NOTE  DATE: 02/11/2013  ID/CC: 47 year old  INTERVAL HISTORY  ------------------------------------------------------------------------  [name] is a 46 y/o male with a history of asthma, DMII and developmental delay who prevents for follow up care.   He states he has no real concerns and tells me about his roommates in his three bedroom apartment and his bowling tournament in which he got second place.  He tells me that his mother calls him to remind him to take his medications.  HE states he has been able to take his medications appropriately.   He is getting ready to play basketball after bowling.  He does ask for refills on his inhalers but denies SOB, wheezing or asthma exacerbations.  He does endorse some allergies and states he has congestion and a sore throat.  HE states he has loratadine and fluticasone and they help his allergies.      ROS: No SOB, no CP, no abdominal pain, no diarrhea, no blood in the stool, no swelling in the legs, no rashes, no fevers/chills    PHYSICAL EXAM  ------------------------------------------------------------------------  Vitals: There were no vitals taken for this visit.  Wt Readings from Last 3 Encounters:   12/25/12 187 lb (84.823 kg)   12/25/12 186 lb (84.369 kg)   09/22/12 191 lb (86.637 kg)     Gen: Pleasant, sitting in chair, in NAD  HEENT: NCAT, MMMI, oropharynx clear without erythema, no cervical LAD.  No thyromegally.   Lungs:CTAB no w/r/r  CV: RRR, no m/r/g, no JVD, 2+radial and DP pulses bilaterally  Abd: NTND, no rebound/guarding.   No masses.  +bowel sounds.   Ext: No C/C/E  Neuro: CNII: PERRL.  CN III/IV/VI: EOMI.  CN V: sensation to light touch intact throughout.  CNVII: smile symmetric.  Strength in eyelids equal bilaterally.  CNIX/X: Palate elevates midline. CN XI: Shoulder shrug symmetric and 5/5 bilaterally.  CN XII: tongue movements equal bilaterally.      ASSESSMENT/PLAN  ------------------------------------------------------------------------  Justin Zavala is a 47 y/o male with a history of asthma, DMII and developmental delay who prevents for follow up care.  He states nothing is wrong however I called Justin Zavala, the patient's case manager, after the appointment to follow up and gain more insight into the patient. I got a more complete story from him.  [facility]  [name] states he is his mental health provider.  He states that in terms of physical health he has done fairly well.  He is eating less junk food than he used to.  He states that the dentition part is a loosing battle.  He has seen the dentist and they have tried to get him to take care of his teeth.  He refuses to brush and floss and use mouth wash.  Justin Zavala does get a yearly assessments and refuses a home care dental eval. He also reports to me that most of the information he gives is inaccurate.  He lives by himself but per his manager he should not.  He has hygiene issues which have been noticed by both [name] and myself.  He is still engaged with [facility] and does come in for at least one time per week.  Per [name] he should be in an adult family home and his family does not want that.      In terms of the meeting with me, I did give a list of  dentists.  I also obtained an A1c, HCV antibody, and gave him a flu shot.  I think that the patient needs support to go to [facility] however that will depend on the family and it sounds like [name] is the most involved in that.      RTC: 6 months  Discussed with: Justin Zavala  ------------------------------------------------------------------------  SUPPLEMENTAL INFORMATION  Problem List  Patient Active Problem List    Diagnosis Date Noted   . Health care maintenance 04/22/2011     CV: Aspirin: taking   Lipids: at goal, on statin 08/2011  Glucose: +DM   AAA(m): n/a   CA: PSA/Mammogram: n/a    Colonoscopy: at 52   ID: HIV: low risk, can check one time at next visit   Hepatitis:  Can check one time at next visit   Syphilis:   TB:   Tdap: 2011   Pvax: 2003   Flu: 11/12, 11/3  Zoster:   Bone/Falls:   EOL:   IPV:       . Asthma 01/04/2011   . DEVELOPMENT DELAYS NEC 05/21/2010     case manager Justin Zavala, cell [phone] is his guardian. Should be called after appointments to update him on any changes  mother Justin Zavala can be reached at [phone]   sister Justin Zavala can be reached at work number [phone]       . Type II or unspecified type diabetes mellitus without mention of complication, not stated as uncontrolled 04/20/2010     Type II Diabetes:  Hgb A1c (at least Q6 months): 08/2012 6.0%  Current Meds:  Metformin   Lipids(Q year): LDL       79   08/21/2011  Proteinuria: 3.8 08/2012 (negative)  Retinopathy: seen by optho 04/2011- [facility]- outside provider   Neuropathy/Footexam: 9/14, no neuropathy, however patient was inconsistent with exam.  No ulcers/sores. Significant dry skin and dermatophytosis as well as onychomycosis.            Medications  Current Outpatient Prescriptions   Medication Sig   . Albuterol Sulfate HFA 108 (90 BASE) MCG/ACT Inhalation Aero Soln Inhale 2 puffs by mouth every 6 hours if needed for shortness of breath   . Aspirin 81 MG Oral Tab EC Take 1 tablet by mouth daily   . Clotrimazole 1 % External Cream Apply to feet 2 times a day for fungal rash   . Divalproex Sodium 500 MG Oral Tab EC take 1.5 pills qhs, FROM [facility]   . Fluticasone Propionate  HFA (FLOVENT HFA) 220 MCG/ACT Inhalation Aerosol 1 puff twice a day EVERYDAY. Even if symptoms are no present   . Hydrochlorothiazide 25 MG Oral Tab Take 1 tablet (25 mg) by mouth daily.   . Lisinopril 30 MG Oral Tab Take 1 tablet (30 mg) by mouth daily.   . Loratadine 10 MG Oral Tab Take 1 tablet (10 mg) by mouth daily.   . MetFORMIN HCl 1000 MG Oral Tab Take 1 tablet (1,000 mg) by mouth 2 times a day.   . Ranitidine HCl 150  MG Oral Tab Take 1 tablet (150 mg) by mouth 2 times a day.   [name] RISPERIDONE OR 50mg  injection--by [facility]   . Simvastatin 10 MG Oral Tab TAKE 1 TABLET BY MOUTH EVERY NIGHT AT BEDTIME   . Skin Protectants, Misc. (EUCERIN) External Cream Apply to affected area(s) 1 to 2 times a day for dry skin     No current facility-administered medications for this  visit.     Recent Labs  Orders Only on 12/25/12   HEMOGLOBIN A1C, HPLC       Result Value Range    Hemoglobin A1C by HPLC Disregard results, wrong chart.  4.0 - 6.0 %    Hemoglobin A1C by HPLC SHOULD BE LOGGED AS OUTSIDE ORDER     CBC, DIFF       Result Value Range    WBC Disregard results, wrong chart.  4.3 - 10.0 THOU/uL    WBC SHOULD BE LOGGED AS OUTSIDE ORDER      RBC Disregard results, wrong chart.  4.40 - 5.60 mil/uL    RBC SHOULD BE LOGGED AS OUTSIDE ORDER      Hemoglobin Disregard results, wrong chart.  13.0 - 18.0 g/dL    Hemoglobin SHOULD BE LOGGED AS OUTSIDE ORDER      Hematocrit Disregard results, wrong chart.  38 - 50 %    Hematocrit SHOULD BE LOGGED AS OUTSIDE ORDER      MCV Disregard results, wrong chart.  81 - 98 fL    MCV SHOULD BE LOGGED AS OUTSIDE ORDER      MCH Disregard results, wrong chart.  27.3 - 33.6 pg    MCH SHOULD BE LOGGED AS OUTSIDE ORDER      MCHC Disregard results, wrong chart.  32.2 - 36.5 g/dL    MCHC SHOULD BE LOGGED AS OUTSIDE ORDER      Platelet Count Disregard results, wrong chart.  150 - 400 THOU/uL    Platelet Count SHOULD BE LOGGED AS OUTSIDE ORDER      RDW-CV Disregard results, wrong chart.  11.6 - 14.4 %    RDW-CV SHOULD BE LOGGED AS OUTSIDE ORDER      % Total Neut. Disregard results, wrong chart.      % Total Neut. SHOULD BE LOGGED AS OUTSIDE ORDER      % Lymphocytes Disregard results, wrong chart.      % Lymphocytes SHOULD BE LOGGED AS OUTSIDE ORDER      % Monocytes Disregard results, wrong chart.      % Monocytes SHOULD BE LOGGED AS OUTSIDE ORDER      % Eosinophils Disregard results, wrong chart.      % Eosinophils  SHOULD BE LOGGED AS OUTSIDE ORDER      % Basophils Disregard results, wrong chart.      % Basophils SHOULD BE LOGGED AS OUTSIDE ORDER      % Immature Granulocytes Disregard results, wrong chart.      % Immature Granulocytes SHOULD BE LOGGED AS OUTSIDE ORDER      Neutrophils Disregard results, wrong chart.  1.80 - 7.00 THOU/uL    Neutrophils SHOULD BE LOGGED AS OUTSIDE ORDER      Lymphocytes Disregard results, wrong chart.  1.00 - 4.80 THOU/uL    Lymphocytes SHOULD BE LOGGED AS OUTSIDE ORDER      Monocytes Disregard results, wrong chart.  0.00 - 0.80 THOU/uL    Monocytes SHOULD BE LOGGED AS OUTSIDE ORDER      Eosinophils Disregard results, wrong chart.  0.00 - 0.50 THOU/uL    Eosinophils SHOULD BE LOGGED AS OUTSIDE ORDER      Basophils Disregard results, wrong chart.  0.00 - 0.20 THOU/uL    Basophils SHOULD BE LOGGED AS OUTSIDE ORDER      Immature Granulocytes Disregard results, wrong chart.  0.00 - 0.05 THOU/uL    Immature Granulocytes SHOULD BE LOGGED AS OUTSIDE ORDER  VALPROIC ACID (DEPAKENE)       Result Value Range    Valproic Acid (Depakene) Disregard results, wrong chart.  50 - 100 mcg/mL    Valproic Acid (Depakene) SHOULD BE LOGGED AS OUTSIDE ORDER         -------------------------------------------  Attending: Mellody Dance, MD  I discussed the history, exam and medical decision making for this patient with Dr. Rica Koyanagi, [facility] CATHERINE. I was present in the clinic during the provisions of these services with no other clinical duties. I concur with the management plan as noted above.  -------------------------------------------

## 2013-02-12 LAB — HEMOGLOBIN A1C, HPLC: Hemoglobin A1C: 5.7 % (ref 4.0–6.0)

## 2013-02-12 LAB — HEPATITIS C AB WITH REFLEX PCR: Hepatitis C Antibody w/Rflx PCR: NONREACTIVE

## 2013-02-12 NOTE — Addendum Note (Signed)
Addended by: Thomasene Lot on: 02/12/2013 10:29 AM     Modules accepted: Level of Service

## 2013-02-14 ENCOUNTER — Encounter (HOSPITAL_BASED_OUTPATIENT_CLINIC_OR_DEPARTMENT_OTHER): Payer: Self-pay | Admitting: Internal Medicine

## 2013-02-23 ENCOUNTER — Other Ambulatory Visit: Payer: Self-pay | Admitting: Internal Medicine

## 2013-02-24 ENCOUNTER — Other Ambulatory Visit: Payer: Self-pay | Admitting: Internal Medicine

## 2013-02-25 MED ORDER — RANITIDINE HCL 150 MG OR TABS
150.0000 mg | ORAL_TABLET | Freq: Two times a day (BID) | ORAL | Status: DC
Start: 2013-02-25 — End: 2013-07-26

## 2013-02-25 MED ORDER — HYDROCHLOROTHIAZIDE 25 MG OR TABS
25.0000 mg | ORAL_TABLET | Freq: Every day | ORAL | Status: DC
Start: 2013-02-25 — End: 2013-07-23

## 2013-02-25 MED ORDER — LORATADINE 10 MG OR TABS
10.0000 mg | ORAL_TABLET | Freq: Every day | ORAL | Status: DC
Start: 2013-02-25 — End: 2013-07-23

## 2013-02-25 NOTE — Telephone Encounter (Signed)
Last seen 02/11/13; to RTC 6mths.

## 2013-04-16 ENCOUNTER — Ambulatory Visit (HOSPITAL_BASED_OUTPATIENT_CLINIC_OR_DEPARTMENT_OTHER): Payer: No Typology Code available for payment source | Admitting: Registered"

## 2013-04-16 NOTE — Progress Notes (Signed)
Pt no showed for appointment today.  Did not leave message for pt at his home phone but did discuss w/ pt's case manager [name].  Asked to have pt rescheduled when he comes to see Dr Jane Canary next Friday 04/23/13    Ed Blalock, [place], [place], CD, CDE  [facility] Ambulatory Care Dietitian  [facility]  [address], [place] [address]  Pager: [phone]

## 2013-04-23 ENCOUNTER — Encounter (HOSPITAL_BASED_OUTPATIENT_CLINIC_OR_DEPARTMENT_OTHER): Payer: Self-pay | Admitting: Internal Medicine

## 2013-04-23 ENCOUNTER — Ambulatory Visit (HOSPITAL_BASED_OUTPATIENT_CLINIC_OR_DEPARTMENT_OTHER): Payer: No Typology Code available for payment source | Attending: Internal Medicine | Admitting: Internal Medicine

## 2013-04-23 VITALS — BP 174/100 | HR 56 | Temp 97.9°F | Ht 64.0 in | Wt 191.0 lb

## 2013-04-23 DIAGNOSIS — I10 Essential (primary) hypertension: Secondary | ICD-10-CM | POA: Insufficient documentation

## 2013-04-23 MED ORDER — LISINOPRIL 10 MG OR TABS
30.0000 mg | ORAL_TABLET | Freq: Once | ORAL | Status: DC
Start: 2013-04-23 — End: 2013-04-23

## 2013-04-23 NOTE — Progress Notes (Addendum)
[facility] ADULT MEDICINE CLINIC  OUTPATIENT NOTE  DATE: 04/23/2013  ID/CC: 48 year old  INTERVAL HISTORY  ------------------------------------------------------------------------  Justin Zavala is a 48 y/o male with a history of asthma, DM and developmental delay who presents for follow up care.      The patient presents a mixed history about taking his medications and not taking daily.  He states that he does not take until Sunday.  He states he took it today but sometimes he forgets.  His blood pressure is high today and he denies taking his lisinopril today.  Pertinent negatives include no headache/dizziness, no SOB, CP, f/c, abdominal pain and no weight loss.   When asked about who helps with his medication and who helps at home, he has a caregiver that comes over 1x/week and his sister comes over daily.  He thinks his sister could help with his medications.  He does not want to go to an [facility] because it will "use all his money."     He does endorse some back pain that he states is not new in his lower back.  The pain is a sharp pain that is constant worse when getting up and is in the center of the back.  Its worst when he gets up in the morning and once he gets going it will be okay. He has never injured his back before.  The patient reports the pain has been going on for a month and thinks its new over the past month. THe patient states it started when he went bowling on the 27th of December. The pain has not gotten better since then and he has not taken any medication for it.      PHYSICAL EXAM  ------------------------------------------------------------------------  Vitals: BP 174/100  Pulse 56  Temp(Src) 97.9 F (36.6 C) (Temporal)  Ht 5\' 4"  (1.626 m)  Wt 191 lb (86.637 kg)  BMI 32.77 kg/m2  Wt Readings from Last 3 Encounters:   04/23/13 191 lb (86.637 kg)   02/11/13 188 lb 12.8 oz (85.639 kg)   12/25/12 187 lb (84.823 kg)     Gen: Obese, sitting in chair in NAD, poor hygeine  HEENT: NCAT, MMMI, poor  dentition  Lungs:CTAB no w/r/r  CV: RRR, no m/r/g  Ext: No C/C/E  Neuro: AxOx3    ASSESSMENT/PLAN  ------------------------------------------------------------------------  [name] is a 48 y/o male with a history of asthma, DM and developmental delay who presents for follow up care.      #DM: We did not discuss in detail today.  No nausea/vomiting to suggest HHS.  Patient states he is not taking his metformin but then changes his story.  Last A1c 5.7 in November, too early to re-check at this time but will recheck at next visit.      #HTN: Patient denies taking his PO anti-Hypertensives today which may explain his elevated bp, however this is difficult to confirm.  Told patient to take his medication at home.  Patient's blood pressures in the past have been 130s-140s/40s-60s, suggesting he did not take his medications today.  No history of HTN documented, although has been on lisinopril.  Goal BP for him would be <140/90  -continue to follow  -encourage patient to take medications  -call sister to enquire about whether sister can assist with medications and ambulatory bp monitoring    #Social: As stated in prior notes, I think patient would benefit from being in an [facility]. This has been worked on by his Tourist information centre manager  to no avail.  Will continue to try to approach this angle as I think this would improve his health care.     Chronic problems  #Asthma: Not discussed today    RTC: 6 months  Discussed with: Justin Zavala  ------------------------------------------------------------------------  SUPPLEMENTAL INFORMATION  Problem List  Patient Active Problem List    Diagnosis Date Noted   . Health care maintenance 04/22/2011     CV: Aspirin: taking   Lipids: at goal, on statin 08/2011  Glucose: +DM   AAA(m): n/a   CA: PSA/Mammogram: n/a   Colonoscopy: at 52   ID: HIV: low risk, can check one time at next visit   HCV: nonreactive 02/12/13  Syphilis:   TB:   Tdap: 2011   Pvax: 2003   Flu: 11/12, 11/3  Zoster:   Bone/Falls:    EOL:   IPV:       . Asthma 01/04/2011   . Other specified delay in development 05/21/2010     case manager Justin Zavala, cell [phone]. Should be called after appointments to update him on any changes  mother Justin Zavala can be reached at [phone]   sister Justin Zavala can be reached at work number [phone]           . Type II or unspecified type diabetes mellitus without mention of complication, not stated as uncontrolled 04/20/2010     Type II Diabetes:  Hgb A1c (at least Q6 months): 08/2012 6.0%, 02/2013 5.7%, repeat due 08/2013  Current Meds:  Metformin   Lipids(Q year): LDL       79   08/21/2011  Proteinuria: 3.8 08/2012 (negative)  Retinopathy: seen by optho 04/2011- [facility]- outside provider   Neuropathy/Footexam: 9/14, no neuropathy, however patient was inconsistent with exam.  No ulcers/sores. Significant dry skin and dermatophytosis as well as onychomycosis.            Medications  Current Outpatient Prescriptions   Medication Sig   . Albuterol Sulfate HFA 108 (90 BASE) MCG/ACT Inhalation Aero Soln Inhale 2 puffs by mouth every 6 hours if needed for shortness of breath   . Aspirin 81 MG Oral Tab EC Take 1 tablet by mouth daily   . Clotrimazole 1 % External Cream Apply to feet 2 times a day for fungal rash   . Divalproex Sodium 500 MG Oral Tab EC take 1.5 pills qhs, FROM [facility]   . Fluticasone Propionate  HFA (FLOVENT HFA) 220 MCG/ACT Inhalation Aerosol 1 puff twice a day EVERYDAY. Even if symptoms are no present   . Hydrochlorothiazide 25 MG Oral Tab Take 1 tablet (25 mg) by mouth daily.   . Lisinopril 30 MG Oral Tab Take 1 tablet (30 mg) by mouth daily.   . Loratadine 10 MG Oral Tab Take 1 tablet (10 mg) by mouth daily.   . MetFORMIN HCl 1000 MG Oral Tab Take 1 tablet (1,000 mg) by mouth 2 times a day.   . Ranitidine HCl 150 MG Oral Tab Take 1 tablet (150 mg) by mouth 2 times a day.   [name] RISPERIDONE OR 50mg  injection--by [facility]   . Simvastatin 10 MG Oral Tab TAKE 1 TABLET BY MOUTH  EVERY NIGHT AT BEDTIME   . Skin Protectants, Misc. (EUCERIN) External Cream Apply to affected area(s) 1 to 2 times a day for dry skin     No current facility-administered medications for this visit.     Recent Labs  Office Visit on 02/11/13   HEPATITIS  C ANTIBODY       Result Value Range    Hepatitis C Ab w/Rflx PCR Nonreactive  NREAC    Hepatitis C Ab Interpretation        Value: No evidence of antibody to hepatitis C virus (HCV).        Anti-HCV may develop slowly (6 weeks - 6 months) over       the course of hepatitis C virus infection.  Rarely it       may be absent in cases of chronic infection.  Anti-HCV       may also disappear after recovery from acute,       self-limited disease.   HEMOGLOBIN A1C, HPLC       Result Value Range    Hemoglobin A1C by HPLC 5.7  4.0 - 6.0 %       -------------------------------------------  Attending: Thomasene Lot, MD  I discussed the history, exam and medical decision making for this patient with Dr. Jane Canary, [facility] CATHERINE. I was present in the clinic during the provisions of these services with no other clinical duties. I concur with the management plan as noted above.  -------------------------------------------

## 2013-05-07 ENCOUNTER — Ambulatory Visit (HOSPITAL_BASED_OUTPATIENT_CLINIC_OR_DEPARTMENT_OTHER): Payer: No Typology Code available for payment source | Admitting: Registered"

## 2013-05-14 ENCOUNTER — Ambulatory Visit (HOSPITAL_BASED_OUTPATIENT_CLINIC_OR_DEPARTMENT_OTHER): Payer: No Typology Code available for payment source | Admitting: Registered"

## 2013-05-14 ENCOUNTER — Other Ambulatory Visit: Payer: Self-pay | Admitting: Cardiovascular Disease

## 2013-05-14 DIAGNOSIS — E119 Type 2 diabetes mellitus without complications: Secondary | ICD-10-CM

## 2013-05-14 MED ORDER — METFORMIN HCL 1000 MG OR TABS
ORAL_TABLET | ORAL | Status: DC
Start: 2013-05-14 — End: 2013-10-14

## 2013-05-14 MED ORDER — LISINOPRIL 30 MG OR TABS
ORAL_TABLET | ORAL | Status: DC
Start: 2013-05-14 — End: 2013-07-08

## 2013-05-28 ENCOUNTER — Telehealth (HOSPITAL_BASED_OUTPATIENT_CLINIC_OR_DEPARTMENT_OTHER): Payer: Self-pay | Admitting: Internal Medicine

## 2013-05-28 ENCOUNTER — Emergency Department (HOSPITAL_BASED_OUTPATIENT_CLINIC_OR_DEPARTMENT_OTHER)
Admission: EM | Admit: 2013-05-28 | Discharge: 2013-05-28 | Disposition: A | Discharge status: Home / Self Care | Payer: No Typology Code available for payment source | Attending: Emergency Medicine | Admitting: Emergency Medicine

## 2013-05-28 DIAGNOSIS — Y9229 Other specified public building as the place of occurrence of the external cause: Secondary | ICD-10-CM | POA: Insufficient documentation

## 2013-05-28 DIAGNOSIS — I10 Essential (primary) hypertension: Secondary | ICD-10-CM | POA: Insufficient documentation

## 2013-05-28 DIAGNOSIS — S0990XA Unspecified injury of head, initial encounter: Secondary | ICD-10-CM
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
## 2013-05-31 ENCOUNTER — Telehealth (HOSPITAL_BASED_OUTPATIENT_CLINIC_OR_DEPARTMENT_OTHER): Payer: Self-pay | Admitting: Internal Medicine
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
## 2013-06-02 ENCOUNTER — Telehealth (HOSPITAL_BASED_OUTPATIENT_CLINIC_OR_DEPARTMENT_OTHER): Payer: Self-pay | Admitting: Registered"

## 2013-06-02 NOTE — Telephone Encounter (Signed)
I called Jeneen Rinks' case manager [name] to discuss Rumi' upcoming appt.  The plan is to have his IT sales professional and possibly his sister [name] as well as [name] to come to the appt.  Dr Jane Canary and [name] are both concerned that Lashon' needs more assistance in making healthy decisions in regards to nutrition and ADL's .  [name] reports that Ruxin' funding does not allow for his chore worker to come to his appts but [employer], the firm that IT sales professional comes from agreed to a one time visit.  His chore worker works from 3-7 on Mondays and Thursdays so I set up an appt for 330pm on 06/14/13.  [name] also reports that he is not certain if Mena or his IT sales professional can read very well.      Ed Blalock, MS, RD, CD, CDE  [facility] Ambulatory Care Dietitian  [place]  [address] [place] [address]  Pager: [phone]

## 2013-06-04 ENCOUNTER — Other Ambulatory Visit (HOSPITAL_BASED_OUTPATIENT_CLINIC_OR_DEPARTMENT_OTHER): Payer: Self-pay | Admitting: Internal Medicine

## 2013-06-04 DIAGNOSIS — Z760 Encounter for issue of repeat prescription: Secondary | ICD-10-CM

## 2013-06-07 MED ORDER — ASPIRIN 81 MG OR TBEC
DELAYED_RELEASE_TABLET | ORAL | Status: DC
Start: 2013-06-04 — End: 2013-11-05

## 2013-06-11 ENCOUNTER — Ambulatory Visit (HOSPITAL_BASED_OUTPATIENT_CLINIC_OR_DEPARTMENT_OTHER): Payer: No Typology Code available for payment source | Admitting: Registered"

## 2013-06-14 ENCOUNTER — Ambulatory Visit (HOSPITAL_BASED_OUTPATIENT_CLINIC_OR_DEPARTMENT_OTHER): Payer: No Typology Code available for payment source | Admitting: Registered"

## 2013-06-14 VITALS — Ht 64.0 in | Wt 191.3 lb

## 2013-06-14 DIAGNOSIS — Z713 Dietary counseling and surveillance: Secondary | ICD-10-CM

## 2013-06-15 NOTE — Progress Notes (Signed)
Nutrition Note    Clinic:  Adult Medicine    Note Type: Reassessment    Referred by: Dr Lucilla Edin  Same day verbal order read back: No  Date referred: 03/2008    Reason For Visit: Weight management    An interpreter was not needed for the visit.    Assessment    Medical Diagnosis: Obesity, Hypertension, Hyperlipidemia, Esophogeal Reflux and Developmental Delay, Schizophrenia, diabetes    Weight History:     Vitals Height Weight BMI (kg/m2)   04/23/2013 5\' 4"  191 lb 32.79     Adult BMI Classification: obese categ. I (30- 34.9)    Last RD visit:   Vitals Height Weight BMI (kg/m2)   12/25/2012 5\' 4"  186 lb 31.93     Initial wt: 189.4lb (04/2008)    Labs:  DIABETES A1C   Latest Ref Rng 4.0 - 6.0 %   08/26/2012 6.0   02/11/2013 5.7     Self monitored blood glucose: does not monitor     Current Outpatient Prescriptions   Medication Sig Dispense Refill   . Albuterol Sulfate HFA 108 (90 BASE) MCG/ACT Inhalation Aero Soln Inhale 2 puffs by mouth every 6 hours if needed for shortness of breath  1 Inhaler  4   . Aspirin 81 MG Oral Tab EC TAKE 1 TABLET BY MOUTH DAILY  28 tablet  5   . Clotrimazole 1 % External Cream Apply to feet 2 times a day for fungal rash  45 g  3   . Divalproex Sodium 500 MG Oral Tab EC take 1.5 pills qhs, [facility]       . Fluticasone Propionate  HFA (FLOVENT HFA) 220 MCG/ACT Inhalation Aerosol 1 puff twice a day EVERYDAY. Even if symptoms are no present  1 Inhaler  12   . Hydrochlorothiazide 25 MG Oral Tab Take 1 tablet (25 mg) by mouth daily.  28 tablet  5   . Lisinopril 30 MG Oral Tab TAKE 1 TABLET BY MOUTH DAILY  28 tablet  4   . Loratadine 10 MG Oral Tab Take 1 tablet (10 mg) by mouth daily.  28 tablet  5   . MetFORMIN HCl 1000 MG Oral Tab TAKE 1 TABLET BY MOUTH TWICE A DAY  56 tablet  4   . Ranitidine HCl 150 MG Oral Tab Take 1 tablet (150 mg) by mouth 2 times a day.  56 tablet  5   . RISPERIDONE OR 50mg  injection--by [facility]       . Simvastatin 10 MG Oral Tab TAKE 1 TABLET BY  MOUTH EVERY NIGHT AT BEDTIME  28 tablet  11   . Skin Protectants, Misc. (EUCERIN) External Cream Apply to affected area(s) 1 to 2 times a day for dry skin  454 g  0     No current facility-administered medications for this visit.     Vitamins/Minerals/Herbal Supplements: not discussed    Nutrition requirements: not discussed    Barriers to adequate intake: None    GI tolerance: None    History   Substance Use Topics   . Smoking status: Never Smoker    . Smokeless tobacco: Never Used   . Alcohol Use: No     Activity: not discussed today    Special/alternative diet: None    Food intolerances/allergies: None    Diet History: [name] chore worker Justin Zavala is here today.  She make meals for him on M, Th, and Saturday.  His sister makes dinner  for him on T, W, and Th.   Justin Zavala just went to the grocery store today and purchased the following: ground [place], chicken, carrots, broccoli, bread, peanut butter, cereal, 2% milk, pizza, hostess cupcakes, and brownies.  He admits he bought too much junk food.     He admits that he is eating junk food on a daily basis w/ some urging from his case manager Justin Zavala who is also at our appt today.     Information from Patient: none    Assessment Summary:       Nutrition Diagnosis: Inability to manage self care  related to develomental delay evidenced by difficulty sticking to recommendations.    Diagnosis Reassessment:  _ Resolved (nutrition problem no longer exists)   _ Improvement shown (Nutrition problem still exists).   x_Unresolved no improvement shown:    _ No longer appropriate (change in condition)    Interventions:    - Discuss healthy meal options and provide w/ meal ideas (4 each breakfast, lunch, and diner) and grocery list for items in these meals.  -Come up with goal of limiting junk food to 2 days a week  -reviewed plate method w/ his chore worker to help her understand recommended portions at meals    Education Provided:  Please refer to Education section of chart      Patient stated goals: to fit into his dress pants by prom in June    Nutrition topics taught:   -healthy meals  - importance of limiting snack foods    Education materials provided:  -Grocery list  - Healthy meals  -written goals    Time spent teaching: 30 mins    Plan/Recommendations    Follow up: 07/16/13    Plan:    -Limit junk food to individual portion on T and F.  Walk to get snack  - Choose meals from list of healthy meals provided    Total Time Spent: 60 mins    Ed Blalock, MS, RD, CD, CDE  [facility] Diet[facility]  [place]  [address], [place] [address]  Pager: [phone]

## 2013-07-08 ENCOUNTER — Encounter (HOSPITAL_BASED_OUTPATIENT_CLINIC_OR_DEPARTMENT_OTHER): Payer: Self-pay | Admitting: Internal Medicine

## 2013-07-08 ENCOUNTER — Ambulatory Visit (HOSPITAL_BASED_OUTPATIENT_CLINIC_OR_DEPARTMENT_OTHER): Payer: No Typology Code available for payment source | Attending: Internal Medicine | Admitting: Internal Medicine

## 2013-07-08 VITALS — BP 155/55 | HR 83 | Temp 97.0°F | Ht 64.0 in | Wt 196.0 lb

## 2013-07-08 DIAGNOSIS — E119 Type 2 diabetes mellitus without complications: Secondary | ICD-10-CM | POA: Insufficient documentation

## 2013-07-08 DIAGNOSIS — I10 Essential (primary) hypertension: Secondary | ICD-10-CM | POA: Insufficient documentation

## 2013-07-08 LAB — BASIC METABOLIC PANEL
Anion Gap: 4 (ref 4–12)
Calcium: 9.4 mg/dL (ref 8.9–10.2)
Carbon Dioxide, Total: 30 mEq/L (ref 22–32)
Chloride: 104 mEq/L (ref 98–108)
Creatinine: 1 mg/dL (ref 0.51–1.18)
GFR, Calc, African American: >60 mL/min (ref >59)
GFR, Calc, European American: >60 mL/min (ref >59)
Glucose: 85 mg/dL (ref 62–125)
Potassium: 4.4 mEq/L (ref 3.6–5.2)
Sodium: 138 mEq/L (ref 135–145)
Urea Nitrogen: 11 mg/dL (ref 8–21)

## 2013-07-08 MED ORDER — LISINOPRIL 40 MG OR TABS
40.0000 mg | ORAL_TABLET | Freq: Every day | ORAL | Status: DC
Start: 2013-07-08 — End: 2013-12-30

## 2013-07-08 NOTE — Progress Notes (Signed)
[facility] ADULT MEDICINE CLINIC  OUTPATIENT NOTE  DATE: 07/08/2013  ID/CC: 48 year old  INTERVAL HISTORY  ------------------------------------------------------------------------  Justin Zavala is a 48 y/o male with a history of asthma, DM and developmental delay who presents for follow up care.  I last saw him on a home visit and we had an extensive discussion about nutrition and food choices.   The plan at that visit was to limit junk food to individual portion on T and F, walk to get snack, and choose meals from list of healthy meals provided.  Today I asked the patient how he was doing with this intervention and what he thought about his food choices since them.   The patient tells me today he is going to the [employer] twice per month and he is sticking with the T/F plan for treats.  He told me he walked to [employer] the other day and he walks every day.   His caregiver has been cooking his meals which include rice, chicken, potatoes, carrots, green beans.    His sister has still been cooking for him as well.   He is still buying ice cream when he goes grocery shopping.  He doesn't eat the whole thing in one sitting.      The patient is taking his medications per him.  He tells me his caregiver reminds him to take his medications.   He does not endorse forgetting his anti-hypertensives as he has in the past.     PHYSICAL EXAM  ------------------------------------------------------------------------  Vitals: There were no vitals taken for this visit.  Wt Readings from Last 3 Encounters:   06/14/13 191 lb 4.8 oz (86.773 kg)   04/23/13 191 lb (86.637 kg)   02/11/13 188 lb 12.8 oz (85.639 kg)     Gen: Obese, sitting in chair in NAD, cleaner today than in the past with better grooming.    HEENT: NCAT, MMMI, poor dentition  Lungs:CTAB no w/r/r  CV: RRR, no m/r/g  Ext: No C/C/E  Neuro: AxOx3    ASSESSMENT/PLAN  ------------------------------------------------------------------------  Justin Zavala is a 48 y/o male with a history of  asthma, DM and developmental delay who presents for follow up care.     #DM:   -Repeat A1c  -continue with nutrition interventions, seems to be helping    #HTN:  Looking back patient has typically had bp 0000000 systolic, and today is at 155.  He tells me he  Is taking his medications and this seems consistent with what I saw at the home visit.  Will plan to add amlodipine to his regimen and calling Justin Zavala his case manager to update.    <140/90  -continue to follow  -increase lisinopril to 40mg  daily    #Social: Patient still living alone.  Case manager Justin Zavala involved.  We both agree that the patient would benefit from [facility] but that is not in the patient's or his family's goals at this time.     Chronic problems  #Asthma: Not discussed today    RTC: 3 months  Discussed with: Justin Zavala  ------------------------------------------------------------------------  SUPPLEMENTAL INFORMATION  Problem List  Patient Active Problem List    Diagnosis Date Noted   . Health care maintenance 04/22/2011     CV: Aspirin: taking   Lipids: at goal, on statin 08/2011  Glucose: +DM   AAA(m): n/a   CA: PSA/Mammogram: n/a   Colonoscopy: at 52   ID: HIV: low risk, can check one time at next visit  HCV: nonreactive 02/12/13  Syphilis:   TB:   Tdap: 2011   Pvax: 2003   Flu: 11/12, 11/3  Zoster:   Bone/Falls:   EOL:   IPV:       . Asthma 01/04/2011   . 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 Should be called after appointments to update him on any changes  mother Justin Zavala can be reached at [phone]   sister Justin Zavala can be reached at work number [phone]           . Type II or unspecified type diabetes mellitus without mention of complication, not stated as uncontrolled 04/20/2010     Type II Diabetes:  Hgb A1c (at least Q6 months): 08/2012 6.0%, 02/2013 5.7%, repeat due 08/2013  Current Meds:  Metformin   Lipids(Q year): LDL       79   08/21/2011  Proteinuria: 3.8 08/2012  (negative)  Retinopathy: seen by optho 04/2011- [facility]- outside provider   Neuropathy/Footexam: 9/14, no neuropathy, however patient was inconsistent with exam.  No ulcers/sores. Significant dry skin and dermatophytosis as well as onychomycosis.            Medications  Current Outpatient Prescriptions   Medication Sig Dispense Refill   . Albuterol Sulfate HFA 108 (90 BASE) MCG/ACT Inhalation Aero Soln Inhale 2 puffs by mouth every 6 hours if needed for shortness of breath  1 Inhaler  4   . Aspirin 81 MG Oral Tab EC TAKE 1 TABLET BY MOUTH DAILY  28 tablet  5   . Clotrimazole 1 % External Cream Apply to feet 2 times a day for fungal rash  45 g  3   . Divalproex Sodium 500 MG Oral Tab EC take 1.5 pills qhs, [facility]       . Fluticasone Propionate  HFA (FLOVENT HFA) 220 MCG/ACT Inhalation Aerosol 1 puff twice a day EVERYDAY. Even if symptoms are no present  1 Inhaler  12   . Hydrochlorothiazide 25 MG Oral Tab Take 1 tablet (25 mg) by mouth daily.  28 tablet  5   . Lisinopril 30 MG Oral Tab TAKE 1 TABLET BY MOUTH DAILY  28 tablet  4   . Loratadine 10 MG Oral Tab Take 1 tablet (10 mg) by mouth daily.  28 tablet  5   . MetFORMIN HCl 1000 MG Oral Tab TAKE 1 TABLET BY MOUTH TWICE A DAY  56 tablet  4   . Ranitidine HCl 150 MG Oral Tab Take 1 tablet (150 mg) by mouth 2 times a day.  56 tablet  5   . RISPERIDONE OR 50mg  injection--[facility]       . Simvastatin 10 MG Oral Tab TAKE 1 TABLET BY MOUTH EVERY NIGHT AT BEDTIME  28 tablet  11   . Skin Protectants, Misc. (EUCERIN) External Cream Apply to affected area(s) 1 to 2 times a day for dry skin  454 g  0     No current facility-administered medications for this visit.     Recent Labs  Office Visit on 02/11/13   HEPATITIS C ANTIBODY       Result Value Ref Range    Hepatitis C Ab w/Rflx PCR Nonreactive  NREAC    Hepatitis C Ab Interpretation        Value: No evidence of antibody to hepatitis C virus (HCV).        Anti-HCV may develop  slowly (6  weeks - 6 months) over       the course of hepatitis C virus infection.  Rarely it       may be absent in cases of chronic infection.  Anti-HCV       may also disappear after recovery from acute,       self-limited disease.   HEMOGLOBIN A1C, HPLC       Result Value Ref Range    Hemoglobin A1C by HPLC 5.7  4.0 - 6.0 %

## 2013-07-09 LAB — HEMOGLOBIN A1C, HPLC: Hemoglobin A1C: 5.8 % (ref 4.0–6.0)

## 2013-07-16 ENCOUNTER — Encounter (HOSPITAL_BASED_OUTPATIENT_CLINIC_OR_DEPARTMENT_OTHER): Payer: No Typology Code available for payment source | Admitting: Registered"

## 2013-07-16 NOTE — Progress Notes (Signed)
-------------------------------------------    Attending: Meara Wiechman Owen Jermichael Belmares  I discussed the history, exam and medical decision making for this patient with Dr. SABO, MICHELLE CATHERINE. I was present in the clinic during the provisions of these services with no other clinical duties. I concur with the management plan as noted above.  -------------------------------------------

## 2013-07-23 ENCOUNTER — Other Ambulatory Visit: Payer: Self-pay | Admitting: Internal Medicine

## 2013-07-23 DIAGNOSIS — I10 Essential (primary) hypertension: Secondary | ICD-10-CM

## 2013-07-23 DIAGNOSIS — J309 Allergic rhinitis, unspecified: Secondary | ICD-10-CM

## 2013-07-23 MED ORDER — HYDROCHLOROTHIAZIDE 25 MG OR TABS
ORAL_TABLET | ORAL | Status: DC
Start: 2013-07-23 — End: 2013-12-30

## 2013-07-23 MED ORDER — LORATADINE 10 MG OR TABS
ORAL_TABLET | ORAL | Status: DC
Start: 2013-07-23 — End: 2013-12-30

## 2013-07-26 ENCOUNTER — Other Ambulatory Visit (HOSPITAL_BASED_OUTPATIENT_CLINIC_OR_DEPARTMENT_OTHER): Payer: Self-pay | Admitting: Internal Medicine

## 2013-07-26 DIAGNOSIS — Z760 Encounter for issue of repeat prescription: Secondary | ICD-10-CM

## 2013-07-26 MED ORDER — RANITIDINE HCL 150 MG OR TABS
ORAL_TABLET | ORAL | Status: DC
Start: 2013-07-26 — End: 2013-07-28

## 2013-07-28 ENCOUNTER — Other Ambulatory Visit (HOSPITAL_BASED_OUTPATIENT_CLINIC_OR_DEPARTMENT_OTHER): Payer: Self-pay | Admitting: Internal Medicine

## 2013-07-28 DIAGNOSIS — Z760 Encounter for issue of repeat prescription: Secondary | ICD-10-CM

## 2013-07-29 MED ORDER — RANITIDINE HCL 150 MG OR TABS
ORAL_TABLET | ORAL | Status: DC
Start: 2013-07-29 — End: 2013-11-05

## 2013-07-29 MED ORDER — SIMVASTATIN 10 MG OR TABS
ORAL_TABLET | ORAL | Status: DC
Start: 2013-07-29 — End: 2013-12-24

## 2013-08-12 ENCOUNTER — Telehealth (HOSPITAL_BASED_OUTPATIENT_CLINIC_OR_DEPARTMENT_OTHER): Payer: Self-pay | Admitting: Internal Medicine

## 2013-08-12 NOTE — Telephone Encounter (Signed)
CONFIRMED PHONE NUMBER: [phone]  CALLERS FIRST AND LAST NAME: Herbert Seta  FACILITY NAME: n/a TITLE: n/a  CALLERS RELATIONSHIP:Self  RETURN CALL: General message OK     SUBJECT: Appointment Request   REASON FOR REQUEST: Patient request to schedule with Dr. Jane Canary on a Friday stating he was told she wanted to see him in about 3 mos from his 4/2 appt to follow up. No slots for Dr. Jane Canary available past 6/24. Patient would like to schedule with Dr. Jane Canary on a Friday in July or August.    REQUEST APPOINTMENT WITH: Linford Arnold  REFERRING PROVIDER: n/a  REQUESTED DATE: a Friday in July or August  REQUESTED TIME: patient did not give a preference for time of day  UNABLE TO APPOINT: Other: Dr. Jane Canary has no slots released past 6/24. Thank you.

## 2013-08-16 ENCOUNTER — Encounter (HOSPITAL_BASED_OUTPATIENT_CLINIC_OR_DEPARTMENT_OTHER): Payer: No Typology Code available for payment source | Admitting: Registered"

## 2013-08-20 ENCOUNTER — Encounter (HOSPITAL_BASED_OUTPATIENT_CLINIC_OR_DEPARTMENT_OTHER): Payer: No Typology Code available for payment source | Admitting: Registered"

## 2013-09-12 NOTE — Progress Notes (Signed)
Attending: Grenda Lora H Phoenix Dresser, MD, MPH    I have personally discussed the case with the resident during or immediately after the patient visit including review of history, physical exam, diagnosis, and treatment plan. I agree with the assessment and plan of care.

## 2013-09-13 ENCOUNTER — Ambulatory Visit (HOSPITAL_BASED_OUTPATIENT_CLINIC_OR_DEPARTMENT_OTHER): Payer: No Typology Code available for payment source | Attending: Internal Medicine | Admitting: Internal Medicine

## 2013-09-13 ENCOUNTER — Telehealth (HOSPITAL_BASED_OUTPATIENT_CLINIC_OR_DEPARTMENT_OTHER): Payer: Self-pay | Admitting: Internal Medicine

## 2013-09-13 ENCOUNTER — Encounter (HOSPITAL_BASED_OUTPATIENT_CLINIC_OR_DEPARTMENT_OTHER): Payer: Self-pay | Admitting: Internal Medicine

## 2013-09-13 VITALS — BP 157/62 | HR 70 | Temp 96.4°F | Ht 64.0 in | Wt 186.6 lb

## 2013-09-13 DIAGNOSIS — F209 Schizophrenia, unspecified: Secondary | ICD-10-CM | POA: Insufficient documentation

## 2013-09-13 DIAGNOSIS — K219 Gastro-esophageal reflux disease without esophagitis: Secondary | ICD-10-CM | POA: Insufficient documentation

## 2013-09-13 DIAGNOSIS — I10 Essential (primary) hypertension: Secondary | ICD-10-CM | POA: Insufficient documentation

## 2013-09-13 MED ORDER — AMLODIPINE BESYLATE 5 MG OR TABS
5.0000 mg | ORAL_TABLET | Freq: Every day | ORAL | Status: DC
Start: 2013-09-13 — End: 2013-12-03

## 2013-09-13 NOTE — Telephone Encounter (Signed)
I called Justin Zavala and let him know that I added amlodipine to his medication regimen.  He will follow up with his CMP nurse and let her know.  I also confirmed that he is seeing [facility] for ophthamology follow up.

## 2013-09-13 NOTE — Progress Notes (Signed)
[facility] ADULT MEDICINE CLINIC  OUTPATIENT NOTE  DATE: 09/13/2013  ID/CC: 48 year old  INTERVAL HISTORY  ------------------------------------------------------------------------  Justin Zavala is a 48 y/o male with a history of asthma, DM and developmental delay who presents for follow up care.     #HTN: At last visit I increased his lisinopril to 40mg  daily however he still has elevated blood pressure.      #DM: The patient is still seeing Ed Blalock.  Only going to [employer] once per month.  Did admit to eating a chocolate donut this morning but states he is eating better.  HIs weight is lower today than it has been.  He also tells me that he buys 2L diet pop.  He tells me that he is trying to buy healthy food but splurges on treats.  He tells me he is walking to [place] too.  He has lost weight (from 196 to 186lbs) and I praised him for this.     He is very happy he paid of his cable bill and that means his allowance will increase.  He had accumulated a large cable bill and it is great he has paid it off.  He told me about watching the [employer] game.        PHYSICAL EXAM  ------------------------------------------------------------------------  Vitals: There were no vitals taken for this visit.  Wt Readings from Last 3 Encounters:   07/08/13 196 lb (88.905 kg)   06/14/13 191 lb 4.8 oz (86.773 kg)   04/23/13 191 lb (86.637 kg)     Gen: Pleasant, sitting in chair, in NAD  HEENT: Poor dentition, wearing glasses  Lungs:CTAB no w/r/r  CV: RRR, no m/r/g, difficult to hear   Ext: Trace pitting edema at the ankles.  See DM section for foot exam today.   Neuro: AxOx3    ASSESSMENT/PLAN  ------------------------------------------------------------------------  Justin Zavala is a 48 y/o male with a history of asthma, DM and developmental delay who presents for follow up care.     #DM: Patient's A1c is 5.8% which especially given his eating habits is quite good.  He is due for an eye exam but he gets his care as an outside provider.     -verify with [name] that he has seen his outside ophthamologist.   -continue with nutrition interventions, seems to be helping  -microalbumin at next visit  -continue metformin     #HTN: Looking back patient has typically had bp >301S systolic, and today is at 155. He tells me he Is taking his medications and this seems consistent with what I saw at the home visit.   -start amlodipine 5mg  daily and call [name] with this update    #Social: Patient still living alone. Case manager [name] involved. We both agree that the patient would benefit from [facility] but that is not in the patient's or his family's goals at this time.     Chronic problems  #Asthma: Not discussed today    #HCM: Will repeat lipids at next appointment.  Patient on ASA and statin however not clear to me that he meets current criteria for these medications and this may help simplify his regimen.      RTC: 4 months   Discussed with: Sherri Sear  ------------------------------------------------------------------------  SUPPLEMENTAL INFORMATION  Problem List  Patient Active Problem List    Diagnosis Date Noted   . Hypertension 07/08/2013   . Health care maintenance 04/22/2011     CV: Aspirin: taking   Lipids: at goal, on  statin 08/2011  Glucose: +DM   AAA(m): n/a   CA: PSA/Mammogram: n/a   Colonoscopy: at 52   ID: HIV: low risk, can check one time at next visit   HCV: nonreactive 02/12/13  Syphilis:   TB:   Tdap: 2011   Pvax: 2003   Flu: 11/12, 11/3  Zoster:   Bone/Falls:   EOL:   IPV:       . Asthma 01/04/2011   . 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 Should be called after appointments to update him on any changes  mother [name] can be reached at [phone]   sister [name] can be reached at work number [phone]           . Type II or unspecified type diabetes mellitus without mention of complication, not stated as uncontrolled 04/20/2010     Type II Diabetes:  Hgb A1c (at least Q6 months):  08/2012 6.0%, 02/2013 5.7%, repeat due 08/2013  Current Meds:  Metformin   Lipids(Q year): LDL       79   08/21/2011  Proteinuria: 3.8 08/2012 (negative)  Retinopathy: seen by optho 04/2011- [facility]- outside provider   Neuropathy/Footexam: 9/14, no neuropathy, however patient was inconsistent with exam.  No ulcers/sores. Significant dry skin and dermatophytosis as well as onychomycosis.            Medications  Current Outpatient Prescriptions   Medication Sig Dispense Refill   . Albuterol Sulfate HFA 108 (90 BASE) MCG/ACT Inhalation Aero Soln Inhale 2 puffs by mouth every 6 hours if needed for shortness of breath 1 Inhaler 4   . Aspirin 81 MG Oral Tab EC TAKE 1 TABLET BY MOUTH DAILY 28 tablet 5   . Clotrimazole 1 % External Cream Apply to feet 2 times a day for fungal rash 45 g 3   . Divalproex Sodium 500 MG Oral Tab EC take 1.5 pills qhs, [facility]     . Fluticasone Propionate  HFA (FLOVENT HFA) 220 MCG/ACT Inhalation Aerosol 1 puff twice a day EVERYDAY. Even if symptoms are no present 1 Inhaler 12   . Hydrochlorothiazide 25 MG Oral Tab TAKE 1 TABLET BY MOUTH DAILY 28 tablet 5   . Lisinopril 40 MG Oral Tab Take 1 tablet (40 mg) by mouth daily. 28 tablet 6   . Loratadine 10 MG Oral Tab TAKE 1 TABLET BY MOUTH DAILY 28 tablet 5   . MetFORMIN HCl 1000 MG Oral Tab TAKE 1 TABLET BY MOUTH TWICE A DAY 56 tablet 4   . Ranitidine HCl 150 MG Oral Tab TAKE 1 TABLET BY MOUTH TWICE A DAY 56 tablet 3   . RISPERIDONE OR 50mg  injection--[facility]     . Simvastatin 10 MG Oral Tab TAKE 1 TABLET BY MOUTH EVERY NIGHT AT BEDTIME 28 tablet 5   . Skin Protectants, Misc. (EUCERIN) External Cream Apply to affected area(s) 1 to 2 times a day for dry skin 454 g 0     No current facility-administered medications for this visit.     Recent Labs  Office Visit on 07/08/13   HEMOGLOBIN A1C, HPLC       Result Value Ref Range    Hemoglobin A1C by HPLC 5.8  4.0 - 6.0 %   BASIC METABOLIC PANEL       Result Value Ref Range     Sodium 138  135 - 145 mEq/L  Potassium 4.4  3.6 - 5.2 mEq/L    Chloride 104  98 - 108 mEq/L    Carbon Dioxide, Total 30  22 - 32 mEq/L    Anion Gap 4  4 - 12    Glucose 85  62 - 125 mg/dL    Urea Nitrogen 11  8 - 21 mg/dL    Creatinine 1.00  0.51 - 1.18 mg/dL    Calcium 9.4  8.9 - 10.2 mg/dL    Gfr, Calc, European American >60  >59 mL/min    GFR, Calc, African American >60  >59 mL/min    GFR, Information        Value: Calculated GFR in mL/min/1.73 m2 by MDRD equation.    Inaccurate with changing renal function.  See   [email or web address]

## 2013-09-16 NOTE — Progress Notes (Signed)
I have personally discussed the case with the resident during or immediately after the patient visit including review of history, physical exam, diagnosis, and treatment plan. I agree with the assessment and plan of care.

## 2013-09-17 ENCOUNTER — Ambulatory Visit (HOSPITAL_BASED_OUTPATIENT_CLINIC_OR_DEPARTMENT_OTHER): Payer: No Typology Code available for payment source | Admitting: Registered"

## 2013-10-11 ENCOUNTER — Ambulatory Visit (HOSPITAL_BASED_OUTPATIENT_CLINIC_OR_DEPARTMENT_OTHER): Payer: No Typology Code available for payment source | Admitting: Registered"

## 2013-10-11 VITALS — Ht 64.0 in | Wt 192.7 lb

## 2013-10-11 DIAGNOSIS — Z713 Dietary counseling and surveillance: Secondary | ICD-10-CM

## 2013-10-14 ENCOUNTER — Other Ambulatory Visit: Payer: Self-pay | Admitting: Internal Medicine

## 2013-10-14 DIAGNOSIS — E119 Type 2 diabetes mellitus without complications: Secondary | ICD-10-CM

## 2013-10-14 MED ORDER — METFORMIN HCL 1000 MG OR TABS
1000.0000 mg | ORAL_TABLET | Freq: Two times a day (BID) | ORAL | Status: DC
Start: 2013-10-14 — End: 2014-04-20

## 2013-10-14 NOTE — Telephone Encounter (Signed)
The patient last received this medication at the requesting pharmacy 09/20/2013 #56

## 2013-10-14 NOTE — Progress Notes (Signed)
Nutrition Note    Clinic:  Adult Medicine    Note Type: Reassessment    Referred by: Dr Lucilla Edin  Same day verbal order read back: No  Date referred: 03/2008    Reason For Visit: Weight management    An interpreter was not needed for the visit.    Assessment    Medical Diagnosis: Obesity, Hypertension, Hyperlipidemia, Esophogeal Reflux and Developmental Delay, Schizophrenia, diabetes    Weight History:   Vitals Height Weight BMI (kg/m2)   10/11/2013 5\' 4"  192 lb 11.2 oz 33.08     Adult BMI Classification: obese categ. I (30- 34.9)    Last RD visit  Vitals Height Weight BMI (kg/m2)   04/23/2013 5\' 4"  191 lb 32.79     Initial wt: 189.4lb (04/2008)    Labs:  DIABETES A1C   Latest Ref Rng 4.0 - 6.0 %   06/05/2012 6.3 (H)   08/26/2012 6.0   02/11/2013 5.7   07/08/2013 5.8   Self monitored blood glucose: does not monitor     Current Outpatient Prescriptions   Medication Sig Dispense Refill   . Albuterol Sulfate HFA 108 (90 BASE) MCG/ACT Inhalation Aero Soln Inhale 2 puffs by mouth every 6 hours if needed for shortness of breath 1 Inhaler 4   . AmLODIPine Besylate 5 MG Oral Tab Take 1 tablet (5 mg) by mouth daily. 30 tablet 3   . Aspirin 81 MG Oral Tab EC TAKE 1 TABLET BY MOUTH DAILY 28 tablet 5   . Clotrimazole 1 % External Cream Apply to feet 2 times a day for fungal rash 45 g 3   . Divalproex Sodium 500 MG Oral Tab EC take 1.5 pills qhs, [facility]     . Fluticasone Propionate  HFA (FLOVENT HFA) 220 MCG/ACT Inhalation Aerosol 1 puff twice a day EVERYDAY. Even if symptoms are no present 1 Inhaler 12   . Hydrochlorothiazide 25 MG Oral Tab TAKE 1 TABLET BY MOUTH DAILY 28 tablet 5   . Lisinopril 40 MG Oral Tab Take 1 tablet (40 mg) by mouth daily. 28 tablet 6   . Loratadine 10 MG Oral Tab TAKE 1 TABLET BY MOUTH DAILY 28 tablet 5   . MetFORMIN HCl 1000 MG Oral Tab Take 1 tablet (1,000 mg) by mouth 2 times a day. 56 tablet 6   . Ranitidine HCl 150 MG Oral Tab TAKE 1 TABLET BY MOUTH TWICE A DAY 56 tablet 3   .  RISPERIDONE OR 50mg  injection--[facility]     . Simvastatin 10 MG Oral Tab TAKE 1 TABLET BY MOUTH EVERY NIGHT AT BEDTIME 28 tablet 5   . Skin Protectants, Misc. (EUCERIN) External Cream Apply to affected area(s) 1 to 2 times a day for dry skin 454 g 0     No current facility-administered medications for this visit.     Vitamins/Minerals/Herbal Supplements: not discussed    Nutrition requirements: not discussed    Barriers to adequate intake: None    GI tolerance: None    History   Substance Use Topics   . Smoking status: Never Smoker    . Smokeless tobacco: Never Used   . Alcohol Use: No     Activity: says he is walking every other day.  Usually to the grocery store or [place].  He gets water at [employer]- nothing else    Special/alternative diet: None    Food intolerances/allergies: None    Diet History: [facility]' chore worker [name] makes  meals and helps with shopping she was at his last appt.  Foye requests more copies of the shopping list we created.  Upon arrival today he has a 2lb bag of m&m's in the pocket of his jacket.  He insists he is limiting intake of candy and junk food.  He does occasional snack on potato chips and lightly buttered popcorn      Information from Patient: none    Assessment Summary:   Pt weight slightly increased from previous appt but essentially stable.  Loys tends to be fairly impulsive about food choices and I suspect will need continued reminders about limiting junk food.      Nutrition Diagnosis: Inability to manage self care  related to develomental delay evidenced by difficulty sticking to recommendations.    Diagnosis Reassessment:  _ Resolved (nutrition problem no longer exists)   _ Improvement shown (Nutrition problem still exists).   x_Unresolved no improvement shown:    _ No longer appropriate (change in condition)    Interventions:    - Remind him about previous goal of limiting junk food to 2 days a week. Come up with solution for large bag of  m&m's    Education Provided:  Please refer to Education section of chart     Patient stated goals: limit treat foods    Nutrition topics taught:   - importance of limiting snack foods    Education materials provided:  -Grocery list  -written goals    Time spent teaching: 30 mins    Plan/Recommendations    Follow up: 12/24/13    Plan:    -Limit junk food to 1 individual portion on T and F.  Divide bag of m&m's into 8 servings and place in freezer     Total Time Spent: 76 mins    Ed Blalock, MS, RD, CD, CDE  [facility] Ambulatory Care Dietitian  [place]  [address] [place] [address]  Pager: [phone]

## 2013-11-05 ENCOUNTER — Other Ambulatory Visit (HOSPITAL_BASED_OUTPATIENT_CLINIC_OR_DEPARTMENT_OTHER): Payer: Self-pay | Admitting: Internal Medicine

## 2013-11-05 DIAGNOSIS — K219 Gastro-esophageal reflux disease without esophagitis: Secondary | ICD-10-CM

## 2013-11-05 DIAGNOSIS — E119 Type 2 diabetes mellitus without complications: Secondary | ICD-10-CM

## 2013-11-05 MED ORDER — ASPIRIN 81 MG OR TBEC
DELAYED_RELEASE_TABLET | ORAL | Status: DC
Start: 2013-11-05 — End: 2013-12-24

## 2013-11-05 MED ORDER — RANITIDINE HCL 150 MG OR TABS
ORAL_TABLET | ORAL | Status: DC
Start: 2013-11-05 — End: 2014-04-20

## 2013-12-02 ENCOUNTER — Other Ambulatory Visit (HOSPITAL_BASED_OUTPATIENT_CLINIC_OR_DEPARTMENT_OTHER): Payer: Self-pay | Admitting: Internal Medicine

## 2013-12-02 DIAGNOSIS — I159 Secondary hypertension, unspecified: Secondary | ICD-10-CM

## 2013-12-03 MED ORDER — AMLODIPINE BESYLATE 10 MG OR TABS
ORAL_TABLET | ORAL | Status: DC
Start: 2013-12-03 — End: 2013-12-24

## 2013-12-24 ENCOUNTER — Ambulatory Visit (HOSPITAL_BASED_OUTPATIENT_CLINIC_OR_DEPARTMENT_OTHER): Payer: No Typology Code available for payment source | Attending: Internal Medicine | Admitting: Internal Medicine

## 2013-12-24 ENCOUNTER — Encounter (HOSPITAL_BASED_OUTPATIENT_CLINIC_OR_DEPARTMENT_OTHER): Payer: Self-pay | Admitting: Internal Medicine

## 2013-12-24 ENCOUNTER — Ambulatory Visit (HOSPITAL_BASED_OUTPATIENT_CLINIC_OR_DEPARTMENT_OTHER): Payer: No Typology Code available for payment source | Admitting: Registered"

## 2013-12-24 VITALS — BP 124/50 | HR 71 | Temp 96.8°F | Ht 64.0 in | Wt 187.0 lb

## 2013-12-24 VITALS — Ht 64.0 in | Wt 188.4 lb

## 2013-12-24 DIAGNOSIS — J4530 Mild persistent asthma, uncomplicated: Secondary | ICD-10-CM

## 2013-12-24 DIAGNOSIS — I10 Essential (primary) hypertension: Secondary | ICD-10-CM | POA: Insufficient documentation

## 2013-12-24 DIAGNOSIS — Z713 Dietary counseling and surveillance: Secondary | ICD-10-CM

## 2013-12-24 DIAGNOSIS — E119 Type 2 diabetes mellitus without complications: Secondary | ICD-10-CM | POA: Insufficient documentation

## 2013-12-24 LAB — ALBUMIN/CREATININE RATIO, RANDOM URINE
Albumin (Micro), URN: 1.4 mg/dL
Albumin/Creatinine Ratio, URN: 9 mg/g Creat (ref <30)
Creatinine/Unit, URN: 159 mg/dL

## 2013-12-24 MED ORDER — ALBUTEROL SULFATE HFA 108 (90 BASE) MCG/ACT IN AERS
2.0000 | INHALATION_SPRAY | Freq: Four times a day (QID) | RESPIRATORY_TRACT | Status: DC | PRN
Start: 2013-12-24 — End: 2014-03-16

## 2013-12-24 MED ORDER — METOPROLOL SUCCINATE ER 25 MG OR TB24
25.0000 mg | EXTENDED_RELEASE_TABLET | Freq: Every day | ORAL | Status: DC
Start: 2013-12-24 — End: 2014-06-16

## 2013-12-24 NOTE — Progress Notes (Signed)
[facility] ADULT MEDICINE CLINIC  OUTPATIENT NOTE  DATE: 09/13/2013  ID/CC: 48 year old  INTERVAL HISTORY  ------------------------------------------------------------------------  Justin Zavala is a 48 y/o male with a history of asthma, DM and developmental delay who presents for follow up care.     #HTN: At last visit I increased his lisinopril to 40mg  daily however he still has elevated blood pressure, therfore added amlodipine.  Denies swelling in his legs.       #DM: The patient is still seeing Ed Blalock.  The patient tells me today that he is not going to eat all of the snacks he bought today and that he will have a bowel of ice cream as his treat.   He is trying to limit to one treat a day and only [employer] once per month.      He has a new caregiver coming and he is telling me about this. He hopes she will help him cut his toenails.          #Asthma: States he ran out of his asthma inhalers.  He states that he used it the day before and that he used it a lot when he was walking to the store.  He states that he gets wheezing when he goes back and forth to the store.  He starts telling me about his 65 year old child again and that they were walking.      ROS: No chest pain, no SOB      PHYSICAL EXAM  ------------------------------------------------------------------------  Vitals: BP 124/50 mmHg  Pulse 71  Temp(Src) 96.8 F (36 C) (Temporal)  Ht 5\' 4"  (1.626 m)  Wt 187 lb (84.823 kg)  BMI 32.08 kg/m2  SpO2 96%  Wt Readings from Last 3 Encounters:   12/24/13 187 lb (84.823 kg)   12/24/13 188 lb 6.4 oz (85.458 kg)   10/11/13 192 lb 11.2 oz (87.408 kg)     Gen: Pleasant, sitting in chair, in NAD, more well groomed than I have seen in the past  HEENT: Poor dentition, wearing glasses  Lungs:CTAB no w/r/r  CV: RRR, no m/r/g, difficult to hear   Ext: 1+ LEE to the knees bilaterally (was trace bilaterally on prior visits)  Neuro:  AxOx3    ASSESSMENT/PLAN  ------------------------------------------------------------------------  Justin Zavala is a 48 y/o male with a history of asthma, DM and developmental delay who presents for follow up care.     #DM: Patient's A1c is 5.8% which especially given his eating habits is quite good.    -continue with nutrition interventions, seems to be helping  -microalbumin   -continue metformin     #HTN: Started amlodipine at last visit for blood pressures >485-462 systolic.  Patient is doing well (bp 124/50) today however he has worsening LEE.    -stop amlodipine 5mg  daily and call [name] with this update  -start metoprolol 25mg  ER daily  -recheck blood pressure in a few months    #Social: Patient still living alone. Case manager [name] involved. We both agree that the patient would benefit from [facility] but that is not in the patient's or his family's goals at this time.     #asthma: Patient reports using his inhaler, difficult to assess how much.  Will clarify with [name] to ensure no further controller medications are needed and renew prescription.     #HCM: Will repeat lipids at next appointment.  Patient on ASA and statin however not clear to me that he meets current criteria for these  medications and this may help simplify his regimen.  Framingham risk score =2%, and based on his risk level, 2% of individuals would experience benefit from aspirin over a ten year period (2/100 over ten years).  This is similar for a statin.  At this point it seems reasonable to stop his aspirin and even his statin.   -d/c aspirin and call [name]  -d/c statin and call [name]    RTC: 3 months   Discussed with: Dr. Jenna Luo  ------------------------------------------------------------------------  SUPPLEMENTAL INFORMATION  Problem List  Patient Active Problem List    Diagnosis Date Noted   . Schizophrenia 09/13/2013     Per chart review     . GERD (gastroesophageal reflux disease) 09/13/2013   . Hypertension 07/08/2013   .  Health care maintenance 04/22/2011     1. BP  2. BMI  3. Lipids: on statin and aspirin  ASCVD risk score:   CHOLESTEROL (HDL)   Date Value Ref Range Status   08/21/2011 30* >40 mg/dL    07/20/2010 28* >40 mg/dL      CHOLESTEROL (LDL)   Date Value Ref Range Status   08/21/2011 79 <130 mg/dL    07/20/2010 84 <130 mg/dL      CHOLESTEROL (TOTAL)   Date Value Ref Range Status   08/21/2011 124 <200 mg/dL    07/20/2010 133 <200 mg/dL      CHOLESTEROL/HDL RATIO   Date Value Ref Range Status   08/21/2011 4.1     07/20/2010 4.8       TRIGLYCERIDE   Date Value Ref Range Status   08/21/2011 74 <150 mg/dL    07/20/2010 104 <150 mg/dL      4. HgA1c: See DM section  5. Vaccines   -tdap (q10 years): 2011    -Pvax: 2003    -influenza (yearly):  11/12, 11/3    6.ID   -HIV   -HCV: nonreactive 02/12/13   -STIs   -TB (if at risk)  7. CA   -colonoscopy (>50)  8. Depression  9. EOL:        . Asthma 01/04/2011   . Other specified delay in development 05/21/2010     case manager [name], cell [phone]. Should be called after appointments to update him on any changes  mother [name] can be reached at [phone]   sister [name] can be reached at work number [phone]           . Type II or unspecified type diabetes mellitus without mention of complication, not stated as uncontrolled 04/20/2010     Type II Diabetes:  Hgb A1c (at least Q6 months): 08/2012 6.0%, 02/2013 5.7%, 08/2013 5.8%, repeat due November 2015  Current Meds:  Metformin   Lipids(Q year): LDL       79   08/21/2011  Proteinuria: 3.8 08/2012 (negative), repeat due  Creatinine 1.0 08/2013  Retinopathy: seen by optho 04/2011- [facility]- outside provider.  Confirmed with [name] he is seeing this provider.  Will attempt to obtain records to ensure he is having a retinal screen.   Neuropathy/Footexam: 6/15, no ulcers or sores, feet well taken care of and soft without dry skin (was dry in the past).  Patient reported no sensation to monofilament however he has been inconsistent on  prior exams.  That said he had diminished reflexes (0) at the ankles bilaterally and 1+ reflexes at the knees. Repeat due 09/2014           Medications  Current Outpatient  Prescriptions   Medication Sig Dispense Refill   . Albuterol Sulfate HFA 108 (90 BASE) MCG/ACT Inhalation Aero Soln Inhale 2 puffs by mouth every 6 hours if needed for shortness of breath 1 Inhaler 4   . AmLODIPine Besylate 10 MG Oral Tab TAKE 1/2 TABLET (5MG ) BY MOUTH DAILY 14 tablet 1   . Aspirin 81 MG Oral Tab EC TAKE 1 TABLET BY MOUTH DAILY 28 tablet 12   . Clotrimazole 1 % External Cream Apply to feet 2 times a day for fungal rash 45 g 3   . Divalproex Sodium 500 MG Oral Tab EC take 1.5 pills qhs, [facility]     . Fluticasone Propionate  HFA (FLOVENT HFA) 220 MCG/ACT Inhalation Aerosol 1 puff twice a day EVERYDAY. Even if symptoms are no present 1 Inhaler 12   . Hydrochlorothiazide 25 MG Oral Tab TAKE 1 TABLET BY MOUTH DAILY 28 tablet 5   . Lisinopril 40 MG Oral Tab Take 1 tablet (40 mg) by mouth daily. 28 tablet 6   . Loratadine 10 MG Oral Tab TAKE 1 TABLET BY MOUTH DAILY 28 tablet 5   . MetFORMIN HCl 1000 MG Oral Tab Take 1 tablet (1,000 mg) by mouth 2 times a day. 56 tablet 6   . Ranitidine HCl 150 MG Oral Tab TAKE 1 TABLET BY MOUTH TWICE A DAY 56 tablet 5   . RISPERIDONE OR 50mg  injection--by [facility]     . Simvastatin 10 MG Oral Tab TAKE 1 TABLET BY MOUTH EVERY NIGHT AT BEDTIME 28 tablet 5   . Skin Protectants, Misc. (EUCERIN) External Cream Apply to affected area(s) 1 to 2 times a day for dry skin 454 g 0     No current facility-administered medications for this visit.     Recent Labs  Office Visit on 07/08/13   1. HEMOGLOBIN A1C, HPLC   Result Value Ref Range    Hemoglobin A1C 5.8 4.0 - 6.0 %   2. BASIC METABOLIC PANEL   Result Value Ref Range    Sodium 138 135 - 145 mEq/L    Potassium 4.4 3.6 - 5.2 mEq/L    Chloride 104 98 - 108 mEq/L    Carbon Dioxide, Total 30 22 - 32 mEq/L    Anion Gap 4 4 - 12    Glucose 85  62 - 125 mg/dL    Urea Nitrogen 11 8 - 21 mg/dL    Creatinine 1.00 0.51 - 1.18 mg/dL    Calcium 9.4 8.9 - 10.2 mg/dL    GFR, Calc, European American >60 >59 mL/min    GFR, Calc, African American >60 >59 mL/min    GFR, Information       Calculated GFR in mL/min/1.73 m2 by MDRD equation.    Inaccurate with changing renal function.  See   [email or web address]

## 2013-12-24 NOTE — Progress Notes (Signed)
I have personally discussed the case with the resident during or immediately after the patient visit including review of history, physical exam, diagnosis, and treatment plan. I agree with the plan

## 2013-12-24 NOTE — Progress Notes (Signed)
Nutrition Note    Clinic:  Adult Medicine    Note Type: Reassessment    Referred by: Dr Lucilla Edin  Same day verbal order read back: No  Date referred: 03/2008    Reason For Visit: Weight management    An interpreter was not needed for the visit.    Assessment    Medical Diagnosis: Obesity, Hypertension, Hyperlipidemia, Esophogeal Reflux and Developmental Delay, Schizophrenia, diabetes    Weight History:   Vitals Height Weight BMI (kg/m2)   12/24/2013 5\' 4"  188 lb 6.4 oz 32.34     Adult BMI Classification: obese categ. I (30- 34.9)    Last RD visit  Vitals Height Weight BMI (kg/m2)   10/11/2013 5\' 4"  192 lb 11.2 oz 33.08     Initial wt: 189.4lb (04/2008)    Labs:  DIABETES A1C   Latest Ref Rng 4.0 - 6.0 %   06/05/2012 6.3 (H)   08/26/2012 6.0   02/11/2013 5.7   07/08/2013 5.8     Self monitored blood glucose: does not monitor     Current Outpatient Prescriptions   Medication Sig Dispense Refill   . Albuterol Sulfate HFA 108 (90 BASE) MCG/ACT Inhalation Aero Soln Inhale 2 puffs by mouth every 6 hours if needed for shortness of breath 1 Inhaler 4   . AmLODIPine Besylate 10 MG Oral Tab TAKE 1/2 TABLET (5MG ) BY MOUTH DAILY 14 tablet 1   . Aspirin 81 MG Oral Tab EC TAKE 1 TABLET BY MOUTH DAILY 28 tablet 12   . Clotrimazole 1 % External Cream Apply to feet 2 times a day for fungal rash 45 g 3   . Divalproex Sodium 500 MG Oral Tab EC take 1.5 pills qhs, [facility]     . Fluticasone Propionate  HFA (FLOVENT HFA) 220 MCG/ACT Inhalation Aerosol 1 puff twice a day EVERYDAY. Even if symptoms are no present 1 Inhaler 12   . Hydrochlorothiazide 25 MG Oral Tab TAKE 1 TABLET BY MOUTH DAILY 28 tablet 5   . Lisinopril 40 MG Oral Tab Take 1 tablet (40 mg) by mouth daily. 28 tablet 6   . Loratadine 10 MG Oral Tab TAKE 1 TABLET BY MOUTH DAILY 28 tablet 5   . MetFORMIN HCl 1000 MG Oral Tab Take 1 tablet (1,000 mg) by mouth 2 times a day. 56 tablet 6   . Ranitidine HCl 150 MG Oral Tab TAKE 1 TABLET BY MOUTH TWICE A DAY 56 tablet 5      . RISPERIDONE OR 50mg  injection--[facility]     . Simvastatin 10 MG Oral Tab TAKE 1 TABLET BY MOUTH EVERY NIGHT AT BEDTIME 28 tablet 5   . Skin Protectants, Misc. (EUCERIN) External Cream Apply to affected area(s) 1 to 2 times a day for dry skin 454 g 0     No current facility-administered medications for this visit.     Vitamins/Minerals/Herbal Supplements: not discussed    Nutrition requirements: not discussed    Barriers to adequate intake: None    GI tolerance: None    History   Substance Use Topics   . Smoking status: Never Smoker    . Smokeless tobacco: Never Used   . Alcohol Use: No     Activity: says he is walking every day.  Usually to the grocery store or [place].  He gets water at [employer]- nothing else.  At [place] he buys a magazine and sandwich    Special/alternative diet: None  Food intolerances/allergies: None    Diet History:   Clerance has bag of caramel popcorn with him that is about half gone.  He says he does not eat this every day.  He has not purchased snack-sized ziploc bags for his m&m's yet but does remember this being the plan and is going to buy some when he gets paid.  He plans to put the m&m's and popcorn in the ziploc bags to help him limit his portions.  He is also trying to limit treats to 2 days a week.    Past diet recall (10/11/13):   B: breakfast sandwich  L: leftovers or sandwich; today had 2 slices of pizza  D: 1 slice pizza last night w/ some apple slices    Information from Patient: none    Assessment Summary:   4.5llb weight loss since our last appt. Kacee does seem to be remembering some of the recommendations made in our previous visits and to the best of his ability is following.        Nutrition Diagnosis: Inability to manage self care  related to develomental delay evidenced by difficulty sticking to recommendations.    Diagnosis Reassessment:  _ Resolved (nutrition problem no longer exists)   x_ Improvement shown (Nutrition problem still  exists).  _Unresolved no improvement shown:    _ No longer appropriate (change in condition)    Interventions:    -Discussed limiting snack foods and agree w/ his plan to package them into small portions    Education Provided:  Please refer to Education section of chart     Patient stated goals:   limit treat foods    Nutrition topics taught:   None    Education materials provided:  -Grocery lists  -Written reminders to repackage m&m's and popcorn    Time spent teaching: 15 mins    Plan/Recommendations    Follow up:    02/25/14    Plan:    -Divide bag of m&m's into 8 servings and popcorn into 6 servings      Total Time Spent: 21 mins    Ed Blalock, MS, RD, CD, CDE  [facility] Ambulatory Care Dietitian  [place]  [address] [place] [address]  Pager: [phone]

## 2013-12-30 ENCOUNTER — Other Ambulatory Visit (HOSPITAL_BASED_OUTPATIENT_CLINIC_OR_DEPARTMENT_OTHER): Payer: Self-pay | Admitting: Internal Medicine

## 2013-12-30 DIAGNOSIS — K219 Gastro-esophageal reflux disease without esophagitis: Secondary | ICD-10-CM

## 2013-12-30 DIAGNOSIS — I159 Secondary hypertension, unspecified: Secondary | ICD-10-CM

## 2013-12-30 MED ORDER — LORATADINE 10 MG OR TABS
ORAL_TABLET | ORAL | Status: DC
Start: 2013-12-30 — End: 2014-03-24

## 2013-12-30 MED ORDER — LISINOPRIL 40 MG OR TABS
ORAL_TABLET | ORAL | Status: DC
Start: 2013-12-30 — End: 2014-03-24

## 2013-12-30 MED ORDER — HYDROCHLOROTHIAZIDE 25 MG OR TABS
ORAL_TABLET | ORAL | Status: DC
Start: 2013-12-30 — End: 2014-03-24

## 2013-12-31 ENCOUNTER — Other Ambulatory Visit (HOSPITAL_BASED_OUTPATIENT_CLINIC_OR_DEPARTMENT_OTHER): Payer: Self-pay | Admitting: Internal Medicine

## 2014-01-11 ENCOUNTER — Encounter (HOSPITAL_BASED_OUTPATIENT_CLINIC_OR_DEPARTMENT_OTHER): Payer: No Typology Code available for payment source | Admitting: Internal Medicine

## 2014-01-12 ENCOUNTER — Encounter (HOSPITAL_BASED_OUTPATIENT_CLINIC_OR_DEPARTMENT_OTHER): Payer: No Typology Code available for payment source | Admitting: Internal Medicine

## 2014-01-27 ENCOUNTER — Other Ambulatory Visit (HOSPITAL_BASED_OUTPATIENT_CLINIC_OR_DEPARTMENT_OTHER): Payer: Self-pay | Admitting: Internal Medicine

## 2014-01-27 NOTE — Telephone Encounter (Signed)
Amlodipine was discontinued at 12/24/2013 visit:  #HTN: Started amlodipine at last visit for blood pressures >940-768 systolic. Patient is doing well (bp 124/50) today however he has worsening LEE.   -stop amlodipine 5mg  daily and call [name] with this update

## 2014-02-25 ENCOUNTER — Ambulatory Visit (HOSPITAL_BASED_OUTPATIENT_CLINIC_OR_DEPARTMENT_OTHER): Payer: No Typology Code available for payment source | Admitting: Registered"

## 2014-02-25 VITALS — Ht 64.0 in | Wt 183.9 lb

## 2014-02-25 DIAGNOSIS — Z713 Dietary counseling and surveillance: Secondary | ICD-10-CM

## 2014-02-28 NOTE — Progress Notes (Signed)
Nutrition Note    Clinic:  Adult Medicine    Note Type: Reassessment    Referred by: Dr Lucilla Edin  Same day verbal order read back: No  Date referred: 03/2008    Reason For Visit: Weight management    An interpreter was not needed for the visit.    Assessment    Medical Diagnosis: Obesity, Hypertension, Hyperlipidemia, Esophogeal Reflux and Developmental Delay, Schizophrenia, diabetes    Weight History:   Vitals Height Weight BMI (kg/m2)   02/25/2014 5\' 4"  183 lb 14.4 oz 31.57     Adult BMI Classification: obese categ. I (30- 34.9)    Last RD visit  Vitals Height Weight BMI (kg/m2)   12/24/2013 5\' 4"  188 lb 6.4 oz 32.34     Initial wt: 189.4lb (04/2008)    Labs:  DIABETES A1C   Latest Ref Rng 4.0 - 6.0 %   06/05/2012 6.3 (H)   08/26/2012 6.0   02/11/2013 5.7   07/08/2013 5.8     Self monitored blood glucose: does not monitor     Current Outpatient Prescriptions   Medication Sig Dispense Refill   . Albuterol Sulfate HFA 108 (90 BASE) MCG/ACT Inhalation Aero Soln Inhale 2 puffs by mouth every 6 hours as needed. For shortness of breath. 1 Inhaler 6   . Clotrimazole 1 % External Cream Apply to feet 2 times a day for fungal rash 45 g 3   . Divalproex Sodium 500 MG Oral Tab EC take 1.5 pills qhs, [facility]     . Fluticasone Propionate  HFA (FLOVENT HFA) 220 MCG/ACT Inhalation Aerosol 1 puff twice a day EVERYDAY. Even if symptoms are no present 1 Inhaler 12   . Hydrochlorothiazide 25 MG Oral Tab TAKE 1 TABLET BY MOUTH DAILY 28 tablet 2   . Lisinopril 40 MG Oral Tab TAKE 1 TABLET BY MOUTH DAILY (40 MG) 28 tablet 2   . Loratadine 10 MG Oral Tab TAKE 1 TABLET BY MOUTH DAILY 28 tablet 2   . MetFORMIN HCl 1000 MG Oral Tab Take 1 tablet (1,000 mg) by mouth 2 times a day. 56 tablet 6   . Metoprolol Succinate ER 25 MG Oral TABLET SR 24 HR Take 1 tablet (25 mg) by mouth daily. Do not chew or crush. 30 tablet 6   . Ranitidine HCl 150 MG Oral Tab TAKE 1 TABLET BY MOUTH TWICE A DAY 56 tablet 5   . RISPERIDONE OR 50mg   injection--[facility]     . Skin Protectants, Misc. (EUCERIN) External Cream Apply to affected area(s) 1 to 2 times a day for dry skin 454 g 0     No current facility-administered medications for this visit.     Vitamins/Minerals/Herbal Supplements: not discussed    Nutrition requirements: not discussed    Barriers to adequate intake: None    GI tolerance: None    History   Substance Use Topics   . Smoking status: Never Smoker    . Smokeless tobacco: Never Used   . Alcohol Use: No     Activity: says he is walking every day.  Usually to the grocery store or [place].  He gets water at [employer]- nothing else.  At [place] he buys a magazine and sandwich    Special/alternative diet: None    Food intolerances/allergies: None    Diet History:   Verdis has bag of doritos with him that he has not opened yet.  He agrees with me to repackage  it at home into 10 individual bags.  He also has his lunch bag with him which we look at during our appt. It contains baggie w/ hot dog pieces (estimate 1 hot dog), bowl of soup, 3-4 individually wrapped ho ho's, 7-8 individually wrapped red vines, 2 pudding cups, large snickers bar (smashed so suspect it has been there a while), partial bag of chips, and 3 rolls with visible mold on top.       Past diet recall (10/11/13):   B: breakfast sandwich  L: leftovers or sandwich; today had 2 slices of pizza  D: 1 slice pizza last night w/ some apple slices    Information from Patient: none    Assessment Summary:   -4.5llb weight loss since our last appt. Kinta has followed up on some of the recommendations from our previous visits- packaged m&m's into individual servings.  He acknowledges he should do the same with bag of doritos he has with him today.    - His lunch bag has excessive junk food though I'm not certain his plan is to eat everything he has with him today.  Of bigger concern is that the rolls in his lunch are indelible.  I worry that food safety may be an issue and that  he may have other food at home that could cause him to get sick.       Nutrition Diagnosis: Inability to manage self care  related to develomental delay evidenced by difficulty sticking to recommendations.    Diagnosis Reassessment:  _ Resolved (nutrition problem no longer exists)   x_ Improvement shown (Nutrition problem still exists).  _Unresolved no improvement shown:    _ No longer appropriate (change in condition)    Interventions:    -Discussed limiting snack foods and agree w/ his plan to re-package doritos into 10 bags  -Discuss how many treats Dodd should put in his lunch bag and agree upon limiting to 2 choices.  - Pt asks for more grocery store lists so we also come up with goal to limit purchase of treat foods to 2 choices per month.  - I will call his case manager to discuss my concern about food safety.       Education Provided:  Please refer to Education section of chart     Patient stated goals:   limit treat foods    Nutrition topics taught:   None    Education materials provided:  -Grocery lists    Time spent teaching: 15 mins    Plan/Recommendations    Follow up:    03/25/14    Plan:    -Divide bag of doritos into 10 servings  Limit treats at grocery store to 2 choices and in lunch bag to 2 choices      Total Time Spent: 67 mins    Ed Blalock, MS, [place], CD, CDE  [facility] Ambulatory Care Dietitian  [place]  [address] [place] [address]  Pager: [phone]

## 2014-03-15 NOTE — Progress Notes (Addendum)
[facility]  OUTPATIENT NOTE  DATE: 03/15/2014  ID/CC: 48 year old  INTERVAL HISTORY  ------------------------------------------------------------------------  Justin Zavala is a 48 y/o male with a history of asthma, DM and developmental delay who presents for follow up care.  States that the metformin causes him to gag and that he doesn't like it.  States he is still able to take it but he didn't take it today.   He states he hasn't taken his medications this morning because he forgot them, so he is going to take his medications in the afternoon.  The patient states that he uses his inhaler almost every day.  He has wheezing with short distances.  He denies chest pain.  He statse he is coughing up phlegm.  Denies smoking but states he used to.  He has not smoked since Justin Zavala caught him one month prior.  REports that he takes his allergy medications at night.  He thinks night and cold seem to worsen his respiratory symptoms.  Denies CP, LEE.     PHYSICAL EXAM  ------------------------------------------------------------------------  Vitals: There were no vitals taken for this visit.  Wt Readings from Last 3 Encounters:   02/25/14 183 lb 14.4 oz (83.416 kg)   12/24/13 187 lb (84.823 kg)   12/24/13 188 lb 6.4 oz (85.458 kg)     General: Overweight, sitting in chair, NAD  HEENT: Poor dentition, wearing glasses  Lungs:CTAB no w/r/r, no use of accessory muscles to breathe, no stridor    Ext: trace to 1+ LEE to the shins bilaterally  Neuro: AxOx3    ASSESSMENT/PLAN  ------------------------------------------------------------------------  [name] is a 48 y/o male with a history of asthma, DM and developmental delay who presents for follow up care.     #DM: Patient continues to maintain steady weight, is continuing to see Ed Blalock.    -continue with nutrition interventions, seems to be helping  -A1c today  -continue metformin     #HTN: Started amlodipine 09/2013 for blood pressures >151-761 systolic.  BP at  follow up visit was 124/50 however the patient had worsening LEE (which was minimal today) and therefore the patient was switched to atenolol.  The patient had an elevated bp again today (153/52) however he stated that he had not taken his bp medications today  -no changes today given patient stated he did not take his medications, however will call Justin Zavala, his CM, to assess whether this is possible or if the patient has been taking his medications.   -start metoprolol 25mg  ER daily  -continue lisinopril 40mg   -continue HCTZ 25mg     #asthma: Patient reports worsening symptoms that are c/w obstructive disease, aggravated by the cold.  Last PFTs in 2011 showed a restrictive pattern (decreased FEV1 and FVC to ~50% with preserved ratio and TLC 83%) however there was a question of effort on the above tests.  The patient is a poor historian and today was telling me about a son (which he does not have) and therefore it makes his symptoms difficult to assess.  I do not expect that the patient's symptoms are cardiac (no CP, minimal LEE).    -call Justin Zavala to ask about symptoms  -renew albuterol and fluticasone inhalers  -continue loratadine for allergies     #Social: Patient still living alone. Case manager Justin Zavala involved. We both agree that the patient would benefit from [facility] but that is not in the patient's or his family's goals at this time.     #HCM:  Stopped aspirin and statin last visit given question of benefit in this patient and current difficulty with taking medications.  Will repeat lipid panel today.   -influenza vaccine today    RTC: 3 months   Discussed with: Dr. Tressa Busman  ------------------------------------------------------------------------  SUPPLEMENTAL INFORMATION  Problem List  Patient Active Problem List    Diagnosis Date Noted   . Schizophrenia 09/13/2013     Per chart review     . GERD (gastroesophageal reflux disease) 09/13/2013   . Hypertension 07/08/2013   . Health care maintenance 04/22/2011      1. BP  2. BMI  3. Lipids: on statin and aspirin  ASCVD risk score:   CHOLESTEROL (HDL)   Date Value Ref Range Status   08/21/2011 30* >40 mg/dL    07/20/2010 28* >40 mg/dL      CHOLESTEROL (LDL)   Date Value Ref Range Status   08/21/2011 79 <130 mg/dL    07/20/2010 84 <130 mg/dL      CHOLESTEROL (TOTAL)   Date Value Ref Range Status   08/21/2011 124 <200 mg/dL    07/20/2010 133 <200 mg/dL      CHOLESTEROL/HDL RATIO   Date Value Ref Range Status   08/21/2011 4.1     07/20/2010 4.8       TRIGLYCERIDE   Date Value Ref Range Status   08/21/2011 74 <150 mg/dL    07/20/2010 104 <150 mg/dL      4. HgA1c: See DM section  5. Vaccines   -tdap (q10 years): 2011    -Pvax: 2003    -influenza (yearly):  11/12, 11/3    6.ID   -HIV   -HCV: nonreactive 02/12/13   -STIs   -TB (if at risk)  7. CA   -colonoscopy (>50)  8. Depression  9. EOL:   10. Food safety concern at last visit with nutrition: D/W patient at next visit and with Justin Zavala.  Per last note, patient is limiting snack food to some degree, has lost weight.     . Asthma 01/04/2011   . 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 Should be called after appointments to update him on any changes  mother Justin Zavala can be reached at [phone]   sister Justin Zavala can be reached at work number [phone]           . Type II or unspecified type diabetes mellitus without mention of complication, not stated as uncontrolled 04/20/2010     Type II Diabetes:  Hgb A1c (at least Q6 months): 08/2012 6.0%, 02/2013 5.7%, 08/2013 5.8%, repeat due November 2015  Current Meds:  Metformin   Lipids(Q year): LDL       79   08/21/2011  Albuminuria: 3.8 08/2012 (negative)-->9 12/2013-->repeat due in one year  Creatinine 1.0 08/2013  Retinopathy: seen by optho 04/2011- [facility]- outside provider.  Confirmed with Justin Zavala he is seeing this provider.  Will attempt to obtain records to ensure he is having a retinal screen.   Neuropathy/Footexam: 6/15, no ulcers or  sores, feet well taken care of and soft without dry skin (was dry in the past).  Patient reported no sensation to monofilament however he has been inconsistent on prior exams.  That said he had diminished reflexes (0) at the ankles bilaterally and 1+ reflexes at the knees. Repeat due 09/2014           Medications  Current Outpatient Prescriptions   Medication Sig Dispense Refill   .  Albuterol Sulfate HFA 108 (90 BASE) MCG/ACT Inhalation Aero Soln Inhale 2 puffs by mouth every 6 hours as needed. For shortness of breath. 1 Inhaler 6   . Clotrimazole 1 % External Cream Apply to feet 2 times a day for fungal rash 45 g 3   . Divalproex Sodium 500 MG Oral Tab EC take 1.5 pills qhs, FROM [facility]     . Fluticasone Propionate  HFA (FLOVENT HFA) 220 MCG/ACT Inhalation Aerosol 1 puff twice a day EVERYDAY. Even if symptoms are no present 1 Inhaler 12   . Hydrochlorothiazide 25 MG Oral Tab TAKE 1 TABLET BY MOUTH DAILY 28 tablet 2   . Lisinopril 40 MG Oral Tab TAKE 1 TABLET BY MOUTH DAILY (40 MG) 28 tablet 2   . Loratadine 10 MG Oral Tab TAKE 1 TABLET BY MOUTH DAILY 28 tablet 2   . MetFORMIN HCl 1000 MG Oral Tab Take 1 tablet (1,000 mg) by mouth 2 times a day. 56 tablet 6   . Metoprolol Succinate ER 25 MG Oral TABLET SR 24 HR Take 1 tablet (25 mg) by mouth daily. Do not chew or crush. 30 tablet 6   . Ranitidine HCl 150 MG Oral Tab TAKE 1 TABLET BY MOUTH TWICE A DAY 56 tablet 5   . RISPERIDONE OR 50mg  injection--by [facility]     . Skin Protectants, Misc. (EUCERIN) External Cream Apply to affected area(s) 1 to 2 times a day for dry skin 454 g 0     No current facility-administered medications for this visit.     Recent Labs  Office Visit on 12/24/13   1. URINE SCREEN, MICROALBUMINURIA   Result Value Ref Range    Creatinine/Unit, URN 159 mg/dL    Albumin (Micro), URN 1.40 mg/dL    Albumin/Creatinine Ratio, URN 9 <30 mg/g Creat

## 2014-03-16 ENCOUNTER — Ambulatory Visit (HOSPITAL_BASED_OUTPATIENT_CLINIC_OR_DEPARTMENT_OTHER): Payer: No Typology Code available for payment source | Attending: Internal Medicine | Admitting: Internal Medicine

## 2014-03-16 VITALS — BP 153/52 | HR 84 | Temp 98.2°F | Resp 20 | Ht 64.0 in | Wt 183.0 lb

## 2014-03-16 DIAGNOSIS — E119 Type 2 diabetes mellitus without complications: Secondary | ICD-10-CM | POA: Insufficient documentation

## 2014-03-16 DIAGNOSIS — F919 Conduct disorder, unspecified: Secondary | ICD-10-CM | POA: Insufficient documentation

## 2014-03-16 DIAGNOSIS — Z23 Encounter for immunization: Secondary | ICD-10-CM | POA: Insufficient documentation

## 2014-03-16 DIAGNOSIS — J4530 Mild persistent asthma, uncomplicated: Secondary | ICD-10-CM | POA: Insufficient documentation

## 2014-03-16 DIAGNOSIS — F69 Unspecified disorder of adult personality and behavior: Secondary | ICD-10-CM | POA: Insufficient documentation

## 2014-03-16 LAB — COMPREHENSIVE METABOLIC PANEL
ALT (GPT): 25 U/L (ref 10–64)
AST (GOT): 24 U/L (ref 9–38)
Albumin: 4.6 g/dL (ref 3.5–5.2)
Alkaline Phosphatase (Total): 87 U/L (ref 39–139)
Anion Gap: 5 (ref 4–12)
Bilirubin (Total): 1.4 mg/dL — ABNORMAL HIGH (ref 0.2–1.3)
Calcium: 9.7 mg/dL (ref 8.9–10.2)
Carbon Dioxide, Total: 29 mEq/L (ref 22–32)
Chloride: 103 mEq/L (ref 98–108)
Creatinine: 0.94 mg/dL (ref 0.51–1.18)
GFR, Calc, African American: >60 mL/min (ref >59)
GFR, Calc, European American: >60 mL/min (ref >59)
Glucose: 94 mg/dL (ref 62–125)
Potassium: 3.3 mEq/L — ABNORMAL LOW (ref 3.6–5.2)
Protein (Total): 6.8 g/dL (ref 6.0–8.2)
Sodium: 137 mEq/L (ref 135–145)
Urea Nitrogen: 10 mg/dL (ref 8–21)

## 2014-03-16 LAB — LIPID PANEL
Cholesterol (LDL): 130 mg/dL — ABNORMAL HIGH (ref <130)
Cholesterol/HDL Ratio: 5.1
HDL Cholesterol: 35 mg/dL — ABNORMAL LOW (ref >39)
Non-HDL Cholesterol: 144 mg/dL (ref 0–159)
Total Cholesterol: 179 mg/dL (ref <200)
Triglyceride: 70 mg/dL (ref <150)

## 2014-03-16 MED ORDER — FLUTICASONE PROPIONATE HFA 220 MCG/ACT IN AERO
INHALATION_SPRAY | RESPIRATORY_TRACT | Status: DC
Start: 2014-03-16 — End: 2014-03-23

## 2014-03-16 MED ORDER — ALBUTEROL SULFATE HFA 108 (90 BASE) MCG/ACT IN AERS
2.0000 | INHALATION_SPRAY | Freq: Four times a day (QID) | RESPIRATORY_TRACT | Status: DC | PRN
Start: 2014-03-16 — End: 2015-01-20

## 2014-03-16 NOTE — Progress Notes (Signed)
VACCINE SCREENING/ORDER WHEN CONTRAINDICATIONS PRESENT  Order date: 03/16/2014  Ordering provider: Crista Curb (General)  Clinic stock used: YES   Interpreter used?: None  Vaccine information sheet(s) discussed, patient/parent/guardian verbalized understanding? YES   Vaccines given: influenza vaccine  VIS given 03/16/2014 by Marlana Salvage, MA .    SCREENING: INACTIVATED VACCINES  NO 1. Do you have a high fever, severe cold-like symptoms or serious infection?  NO 2. Do you have any food allergies such as eggs, chicken, chicken feathers, chicken dander, or gelatin?  NO 3. Do you have any allergies to neomycin, streptomycin, or polymyxin?   NO 4. Have you had any severe reaction to a vaccine in the past such as a seizure, a convulsion from a high fever, or a paralysis problem called Guillain-Barre Syndrome? If so, which vaccine(s):     If the patient/parent/or guardian answered "yes" to any of the above questions, consult with the provider to review the responses prior to administering vaccination. Parents with a contraindication to immunization will not receive the immunization without a specific physician order to vaccinate the patient and discussion of risk and benefits related to the contraindication.     Physician Order for Administration of Vaccine(s) When Contraindications Are Present  I have reviewed the existence in this patient's health history of possible contraindication(s) to administration of vaccine. The benefits to this patient outweigh the potential risks of immunization. Administer vaccine(s):  Marlana Salvage, 03/16/2014 2:44 PM MA.

## 2014-03-17 LAB — HEMOGLOBIN A1C, HPLC: Hemoglobin A1C: 6 % (ref 4.0–6.0)

## 2014-03-18 LAB — VALPROIC ACID, FREE AND TOTAL
Valproic Acid, Free (Sendout): <3 ug/mL — ABNORMAL LOW (ref 5–25)
Valproic Acid, Total (Sendout): 10 ug/mL — ABNORMAL LOW (ref 50–125)

## 2014-03-22 NOTE — Addendum Note (Signed)
Addended by: Leanord Hawking on: 03/22/2014 01:10 PM     Modules accepted: Level of Service

## 2014-03-22 NOTE — Progress Notes (Signed)
I have personally discussed the case with Dr. Sabo during or immediately after the patient visit including review of history, physical exam, diagnosis, and treatment plan. I agree with the assessment and plan of care.

## 2014-03-23 ENCOUNTER — Telehealth (HOSPITAL_BASED_OUTPATIENT_CLINIC_OR_DEPARTMENT_OTHER): Payer: Self-pay | Admitting: Internal Medicine

## 2014-03-23 DIAGNOSIS — J45909 Unspecified asthma, uncomplicated: Secondary | ICD-10-CM

## 2014-03-23 MED ORDER — BECLOMETHASONE DIPROPIONATE 80 MCG/ACT IN AERS
2.0000 | INHALATION_SPRAY | Freq: Two times a day (BID) | RESPIRATORY_TRACT | Status: DC
Start: 2014-03-23 — End: 2015-04-21

## 2014-03-23 NOTE — Telephone Encounter (Signed)
Patient currently prescribed Flovent 220 mcg one puff BID. Insurance prefers Qvar and a prescription will be sent to [facility] for Qvar 80 mcg 2 puffs BID via e-Rx.

## 2014-03-23 NOTE — Telephone Encounter (Signed)
CONFIRMED PHONE NUMBER: [phone]  CALLERS FIRST AND LAST NAME: Maryann Conners  FACILITY NAME: [place] TITLE: Pharmacy Manager  CALLERS RELATIONSHIP:OTHER  RETURN CALL: General message OK     SUBJECT: Medication Management   REASON FOR REQUEST: Medication Management    MEDICATION(S): Flovent  MEDICATION(S)  NEEDED BY: n/a  ADDITIONAL INFORMATION: Insurance does not cover Flovent, the prefer Quvar. Please respond at earliest convenience. Thank you.

## 2014-03-24 ENCOUNTER — Other Ambulatory Visit (HOSPITAL_BASED_OUTPATIENT_CLINIC_OR_DEPARTMENT_OTHER): Payer: Self-pay | Admitting: Internal Medicine

## 2014-03-24 DIAGNOSIS — I159 Secondary hypertension, unspecified: Secondary | ICD-10-CM

## 2014-03-25 ENCOUNTER — Ambulatory Visit (HOSPITAL_BASED_OUTPATIENT_CLINIC_OR_DEPARTMENT_OTHER): Payer: No Typology Code available for payment source | Admitting: Registered"

## 2014-03-25 MED ORDER — LORATADINE 10 MG OR TABS
ORAL_TABLET | ORAL | Status: DC
Start: 2014-03-25 — End: 2014-06-16

## 2014-03-25 MED ORDER — LISINOPRIL 40 MG OR TABS
ORAL_TABLET | ORAL | Status: DC
Start: 2014-03-25 — End: 2014-06-16

## 2014-03-25 MED ORDER — HYDROCHLOROTHIAZIDE 25 MG OR TABS
ORAL_TABLET | ORAL | Status: DC
Start: 2014-03-25 — End: 2014-06-16

## 2014-03-29 ENCOUNTER — Telehealth (HOSPITAL_BASED_OUTPATIENT_CLINIC_OR_DEPARTMENT_OTHER): Payer: Self-pay | Admitting: Internal Medicine

## 2014-03-29 NOTE — Telephone Encounter (Signed)
I called [name] and asked him about the patient's asthma.  [name] states that the patient has not reported any SOB and when [name] sees him in the office he does not note any respiratory distress.  In the office [name] is able to talk to him and he states his asthma is the same.      I also talked to [name] about restarting the statin.  [name] felt like the patient benefits from fewer medications and that if a medicatoin is not 100% necessary then it is reasonable not to restart and just monitor.

## 2014-03-30 ENCOUNTER — Encounter (HOSPITAL_BASED_OUTPATIENT_CLINIC_OR_DEPARTMENT_OTHER): Payer: Self-pay | Admitting: Internal Medicine

## 2014-04-20 ENCOUNTER — Other Ambulatory Visit (HOSPITAL_BASED_OUTPATIENT_CLINIC_OR_DEPARTMENT_OTHER): Payer: Self-pay | Admitting: Internal Medicine

## 2014-04-20 DIAGNOSIS — E118 Type 2 diabetes mellitus with unspecified complications: Secondary | ICD-10-CM

## 2014-04-20 DIAGNOSIS — K219 Gastro-esophageal reflux disease without esophagitis: Secondary | ICD-10-CM

## 2014-04-21 MED ORDER — RANITIDINE HCL 150 MG OR TABS
ORAL_TABLET | ORAL | Status: DC
Start: 2014-04-21 — End: 2014-11-07

## 2014-04-21 MED ORDER — METFORMIN HCL 1000 MG OR TABS
ORAL_TABLET | ORAL | Status: DC
Start: 2014-04-21 — End: 2014-11-07

## 2014-05-31 ENCOUNTER — Encounter (HOSPITAL_BASED_OUTPATIENT_CLINIC_OR_DEPARTMENT_OTHER): Payer: No Typology Code available for payment source | Admitting: Internal Medicine

## 2014-06-16 ENCOUNTER — Other Ambulatory Visit (HOSPITAL_BASED_OUTPATIENT_CLINIC_OR_DEPARTMENT_OTHER): Payer: Self-pay | Admitting: Internal Medicine

## 2014-06-16 DIAGNOSIS — I159 Secondary hypertension, unspecified: Secondary | ICD-10-CM

## 2014-06-16 DIAGNOSIS — I10 Essential (primary) hypertension: Secondary | ICD-10-CM

## 2014-06-17 MED ORDER — LORATADINE 10 MG OR TABS
ORAL_TABLET | ORAL | Status: DC
Start: 2014-06-17 — End: 2014-10-19

## 2014-06-17 MED ORDER — LISINOPRIL 40 MG OR TABS
ORAL_TABLET | ORAL | Status: DC
Start: 2014-06-17 — End: 2014-10-19

## 2014-06-17 MED ORDER — HYDROCHLOROTHIAZIDE 25 MG OR TABS
ORAL_TABLET | ORAL | Status: DC
Start: 2014-06-17 — End: 2014-10-19

## 2014-06-17 MED ORDER — METOPROLOL SUCCINATE ER 25 MG OR TB24
EXTENDED_RELEASE_TABLET | ORAL | Status: DC
Start: 2014-06-17 — End: 2014-09-07

## 2014-08-23 ENCOUNTER — Encounter (HOSPITAL_BASED_OUTPATIENT_CLINIC_OR_DEPARTMENT_OTHER): Payer: No Typology Code available for payment source | Admitting: Internal Medicine

## 2014-09-06 NOTE — Progress Notes (Signed)
[facility]IN[facility]  OUTPATIENT NOTE  DATE: 03/15/2014  ID/CC: Mr. Mcbreen is a 49 y/o male with a history of asthma, DM and developmental delay who presents for follow up care.  INTERVAL HISTORY    #Asthma:  At previous recent visits patient has reported that he is using his inhaler everyday, however this has not been noticed by his CM, Luisa Hart, whom I called after last visit.   No SOB, states he occasionally he gets wheezing.  States he uses his inhaler once every other day.      #DM: Patient not on statin as we were working to simplify his regimen.  Repeat lipid panel showed LDL of 130 at that time.  Also not on ASA for similar reasons.  States he is eating only some snacks.  Had a subway sandwhich for lunch.  His caregiver cooks for him but he also reports that he goes to [facility] frequently.      ROS: No CP.  States he is sometimes dizzy but has not passed out.  No changes in stool, no blood in stool.  No dysuria.  Endorses some heartburn after he eats.  Denies pain with swallowing.  Has not had weight loss.   Endorses taking medications as they are set out by his caregiver.     PHYSICAL EXAM  ------------------------------------------------------------------------  Vitals: Temp(Src) 96.8 F (36 C) (Temporal)  Ht  (1.626 m)  Wt 188 lb 1.6 oz (85.322 kg)  BMI 32.27 kg/m2  Wt Readings from Last 3 Encounters:   09/07/14 188 lb 1.6 oz (85.322 kg)   03/16/14 183 lb (83.008 kg)   02/25/14 183 lb 14.4 oz (83.416 kg)   Pulse 78  BP 115/55   General: Overweight, sitting in chair, NAD  HEENT: Poor dentition, wearing glasses  Lungs:CTAB no w/r/r, no use of accessory muscles to breathe, no stridor    CV: RRR, no m/r/g  VWU:[id number] inbetween the toes, +onycomycosis, no lesions or sores, please see diabetes section for details of monofilament exam  Neuro: AxOx3    RESULTS    Results for orders placed or performed in visit on 03/16/14   LIPID PANEL   Result Value Ref Range    Cholesterol (Total) 179 <200 mg/dL    Triglyceride 70 <782 mg/dL    Cholesterol (HDL) 35 (L) >39 mg/dL    Cholesterol (LDL) 956 (H) <130 mg/dL    Non-HDL Cholesterol 144 0 - 159 mg/dL    Cholesterol/HDL Ratio 5.1     Lipid Panel, Additional Info. (NOTE)      ASSESSMENT/PLAN    Mr. Pangborn is a 49 y/o male with a history of asthma, DM and developmental delay who presents for follow up care.     #DM: Patient has gained weight, counseled him again RE snacks.  A1c at last check 6.0 and actually in the pre-diabetes range.  No reason to add medications at this time.   On foot exam, questionable whether he can sense monofilament (based on cooperation).  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   -continue with nutrition interventions, seems to be helping  -continue metformin   -repeat A1c today    #Tinea pedis: Patient at risk for infection given (likely) neuropathy and diabetes.  Will treat.  Can consider terbenefine in the future however patient would need close lab followup (LFTs within the first month to ensure tolerating) and I didn't want to  initiate that as he transitioned providers, especially since patient himself is not reliable.   -ketoconazole cream  -consider terbenefine at next visit.     #HTN: Started amlodipine 09/2013 for blood pressures >762-831 systolic.  BP at follow up visit was 124/50 however the patient had worsening LEE (which was minimal today) and therefore the patient was switched to metoprolol.  Today his BP was low and he did report some syncope, therefore will decrease metoprolol to 12.5mg  daily.   -change metoprolol to 12.5mg  ER daily  -continue lisinopril 40mg   -continue HCTZ 25mg     #Social: Patient still living alone. Case manager [name] involved. We both agree that the patient would benefit from [facility] but that is not in the patient's or his family's goals at this time.     #HCM: See problem list    RTC: 3 months   Discussed with: Dr.  Tressa Busman  ------------------------------------------------------------------------  SUPPLEMENTAL INFORMATION  Problem List  Patient Active Problem List    Diagnosis Date Noted   . Schizophrenia ([place]) 09/13/2013     Per chart review     . GERD (gastroesophageal reflux disease) 09/13/2013   . Hypertension 07/08/2013   . Health care maintenance 04/22/2011     #Lipids:   ASCVD risk score: 19%.  If treated with a statin, high dose would prevent stroke in 8, moderate dose in 5.  D/W [name] and given the possible benefit and that patient benefits from fewer medications, will not restart at this time and will monitor lipids yearly.  Can readdress at any time, however patient seems to be more compliant with fewer medications.  (Based on telephone conversation 03/29/14)  CHOLESTEROL (HDL)   Date Value Ref Range Status   03/16/2014 35* >39 mg/dL Final   08/21/2011 30* >40 mg/dL      CHOLESTEROL (LDL)   Date Value Ref Range Status   03/16/2014 130* <130 mg/dL Final   08/21/2011 79 <130 mg/dL      CHOLESTEROL (TOTAL)   Date Value Ref Range Status   03/16/2014 179 <200 mg/dL Final   08/21/2011 124 <200 mg/dL      CHOLESTEROL/HDL RATIO   Date Value Ref Range Status   03/16/2014 5.1  Final   08/21/2011 4.1       TRIGLYCERIDE   Date Value Ref Range Status   03/16/2014 70 <150 mg/dL Final   08/21/2011 74 <150 mg/dL    #Vaccines   -tdap (q10 years): 2011    -Pvax: 2003    -influenza (yearly):  11/12, 11/3, 02/2014    6.ID   -HIV   -HCV: nonreactive 02/12/13   -STIs  #Colon cancer screen: age 15  #Social: [name] has talked with patient's sister, they will work with patient after first of year to look at [facility].  Doroteo Bradford, patient's sister was looking at [facility] per [name].  Of note, there were previously food safety concerns, [name] and sister alerted 03/2014.      [name] Extrinsic asthma 01/04/2011   . 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 Should be called after appointments to  update him on any changes  mother [name] can be reached at [phone]   sister [name] can be reached at work number [phone]           . Type 2 diabetes mellitus with complication ([place]) 35/00/9381     Type II Diabetes:  Hgb A1c (at least Q6 months): 08/2012 6.0%, 02/2013 5.7%, 08/2013 5.8%,  03/2014 6.0%, stable  Current Meds:  Metformin   Lipids(Q year):  See health care maintenance  Albuminuria: 3.8 08/2012 (negative)-->9 12/2013-->repeat due in one year  Creatinine 1.0 08/2013  Retinopathy: seen by optho 04/2011- [facility]- outside provider.  Confirmed with [name] he is seeing this provider.  Will attempt to obtain records to ensure he is having a retinal screen.   Neuropathy/Footexam: 6/15, no ulcers or sores, feet well taken care of and soft without dry skin (was dry in the past).  Patient reported no sensation to monofilament however he has been inconsistent on prior exams.  That said he had diminished reflexes (0) at the ankles bilaterally and 1+ reflexes at the knees. Repeat due 09/2014           Medications  Current Outpatient Prescriptions   Medication Sig Dispense Refill   . Albuterol Sulfate HFA 108 (90 BASE) MCG/ACT Inhalation Aero Soln Inhale 2 puffs by mouth every 6 hours as needed. For shortness of breath. 1 Inhaler 6   . Beclomethasone Dipropionate 80 MCG/ACT Inhalation Aero Soln Inhale 2 puffs by mouth 2 times a day. Even if symptoms are no present 1 Inhaler 3   . Clotrimazole 1 % External Cream Apply to feet 2 times a day for fungal rash 45 g 3   . Divalproex Sodium 500 MG Oral Tab EC take 1.5 pills qhs, FROM [facility]     . Hydrochlorothiazide 25 MG Oral Tab TAKE 1 TABLET BY MOUTH DAILY 28 tablet 2   . Ketoconazole 2 % External Cream Apply to affected area on foot or feet 2 times a day. 30 g 1   . Lisinopril 40 MG Oral Tab TAKE 1 TABLET BY MOUTH DAILY (40 MG) 28 tablet 2   . Loratadine 10 MG Oral Tab TAKE 1 TABLET BY MOUTH DAILY 28 tablet 2   . MetFORMIN HCl 1000 MG Oral Tab TAKE 1 TABLET BY  MOUTH TWICE A DAY 56 tablet 5   . Metoprolol Succinate ER 25 MG Oral TABLET SR 24 HR Take 0.5 tablets (12.5 mg) by mouth daily. Do not chew or crush. 28 tablet 2   . Ranitidine HCl 150 MG Oral Tab TAKE 1 TABLET BY MOUTH TWICE A DAY 56 tablet 5   . RISPERIDONE OR 50mg  injection--by [facility]     . Skin Protectants, Misc. (EUCERIN) External Cream Apply to affected area(s) 1 to 2 times a day for dry skin 454 g 0     No current facility-administered medications for this visit.     Recent Labs  Office Visit on 03/16/14   1. HEMOGLOBIN A1C, HPLC   Result Value Ref Range    Hemoglobin A1C 6.0 4.0 - 6.0 %   2. LIPID PANEL   Result Value Ref Range    Cholesterol (Total) 179 <200 mg/dL    Triglyceride 70 <150 mg/dL    Cholesterol (HDL) 35 (L) >39 mg/dL    Cholesterol (LDL) 130 (H) <130 mg/dL    Non-HDL Cholesterol 144 0 - 159 mg/dL    Cholesterol/HDL Ratio 5.1     Lipid Panel, Additional Info. (NOTE)    3. VALPROIC ACID, FREE AND TOTAL   Result Value Ref Range    Valproic Acid, Free (Sendout) <3 (L) 5-25 mcg/mL    Valproic Acid, Total (Sendout) 10 (L) 50-125 mcg/mL   4. COMPREHENSIVE METABOLIC PANEL   Result Value Ref Range    Sodium 137 135 - 145 mEq/L  Potassium 3.3 (L) 3.6 - 5.2 mEq/L    Chloride 103 98 - 108 mEq/L    Carbon Dioxide, Total 29 22 - 32 mEq/L    Anion Gap 5 4 - 12    Glucose 94 62 - 125 mg/dL    Urea Nitrogen 10 8 - 21 mg/dL    Creatinine 0.94 0.51 - 1.18 mg/dL    Protein (Total) 6.8 6.0 - 8.2 g/dL    Albumin 4.6 3.5 - 5.2 g/dL    Bilirubin (Total) 1.4 (H) 0.2 - 1.3 mg/dL    Calcium 9.7 8.9 - 10.2 mg/dL    AST (GOT) 24 9 - 38 U/L    Alkaline Phosphatase (Total) 87 39 - 139 U/L    ALT (GPT) 25 10 - 64 U/L    GFR, Calc, European American >60 >59 mL/min    GFR, Calc, African American >60 >59 mL/min    GFR, Information       Calculated GFR in mL/min/1.73 m2 by MDRD equation.  Inaccurate with changing renal function.  See http://[email or web address]

## 2014-09-07 ENCOUNTER — Ambulatory Visit (HOSPITAL_BASED_OUTPATIENT_CLINIC_OR_DEPARTMENT_OTHER): Payer: No Typology Code available for payment source | Attending: Internal Medicine | Admitting: Internal Medicine

## 2014-09-07 VITALS — Temp 96.8°F | Ht 64.0 in | Wt 188.1 lb

## 2014-09-07 DIAGNOSIS — I10 Essential (primary) hypertension: Secondary | ICD-10-CM | POA: Insufficient documentation

## 2014-09-07 DIAGNOSIS — E119 Type 2 diabetes mellitus without complications: Secondary | ICD-10-CM | POA: Insufficient documentation

## 2014-09-07 DIAGNOSIS — B353 Tinea pedis: Secondary | ICD-10-CM | POA: Insufficient documentation

## 2014-09-07 MED ORDER — METOPROLOL SUCCINATE ER 25 MG OR TB24
12.5000 mg | EXTENDED_RELEASE_TABLET | Freq: Every day | ORAL | Status: DC
Start: 2014-09-07 — End: 2014-11-30

## 2014-09-07 MED ORDER — KETOCONAZOLE 2 % EX CREA
TOPICAL_CREAM | Freq: Two times a day (BID) | CUTANEOUS | Status: DC
Start: 2014-09-07 — End: 2015-07-10

## 2014-09-07 NOTE — Patient Instructions (Signed)
Please decrease metoprolol to 12.5mg  (1/2 tablet) daily    Please use ketoconazole cream on athlete's foot on feet twice daily until scaling is gone

## 2014-09-17 NOTE — Progress Notes (Signed)
I have personally discussed the case with the resident during or immediately after the patient visit including review of history, physical exam, diagnosis, and treatment plan. I agree with the assessment and plan of care.

## 2014-09-17 NOTE — Addendum Note (Signed)
Addended by: Leanord Hawking on: 09/17/2014 07:55 AM     Modules accepted: Level of Service

## 2014-10-18 ENCOUNTER — Telehealth (HOSPITAL_BASED_OUTPATIENT_CLINIC_OR_DEPARTMENT_OTHER): Payer: Self-pay | Admitting: Internal Medicine

## 2014-10-18 DIAGNOSIS — J309 Allergic rhinitis, unspecified: Secondary | ICD-10-CM

## 2014-10-18 DIAGNOSIS — I159 Secondary hypertension, unspecified: Secondary | ICD-10-CM

## 2014-10-18 NOTE — Telephone Encounter (Signed)
CONFIRMED PHONE NUMBER: [phone]  CALLERS FIRST AND LAST NAME: Margarita Grizzle  FACILITY NAME: [facility] TITLE: RN  CALLERS RELATIONSHIP:OTHER: [employer] patient  RETURN CALL: Detailed message on voicemail only     [facility] ONLY) Texting is an option for this clinic. If you would like [place] to use this option, which mobile phone number should we text to if we are unable to reach you?    SUBJECT: Refill Request        REASON FOR REQUEST:Refill Request    MEDICATION(S): All medications prescribed by Dr. Jane Canary  MEDICATION (S) NEEDED BY: ASAP - Patient is completely out and needs a refill ASAP prior to his 8/1 appointment.   PRESCRIBING PROVIDER: Dr. Jane Canary  PHARMACY NAME AND LOCATION: [facility] [place] [phone] [place] PHONE AND FAX NUMBER: above  ADDITIONAL INFORMATION: RN at [facility] called because pharmacy requested this refill request for all medications and they didn't hear back, even though patient has an appointment scheduled for 8/1. Patient is completely out of medication and his RN at [facility] is requesting an urgent refill.

## 2014-10-18 NOTE — Telephone Encounter (Signed)
The patient last received these medications at the requesting pharmacy on 08/11/2014

## 2014-10-19 MED ORDER — LISINOPRIL 40 MG OR TABS
ORAL_TABLET | ORAL | Status: DC
Start: 2014-10-19 — End: 2014-12-29

## 2014-10-19 MED ORDER — LORATADINE 10 MG OR TABS
ORAL_TABLET | ORAL | Status: DC
Start: 2014-10-19 — End: 2014-12-29

## 2014-10-19 MED ORDER — HYDROCHLOROTHIAZIDE 25 MG OR TABS
ORAL_TABLET | ORAL | Status: DC
Start: 2014-10-19 — End: 2014-12-29

## 2014-10-19 NOTE — Addendum Note (Signed)
Addended by: Nena Polio T on: 10/19/2014 08:24 AM     Modules accepted: Orders

## 2014-11-02 ENCOUNTER — Other Ambulatory Visit: Payer: Self-pay | Admitting: Internal Medicine

## 2014-11-03 NOTE — Telephone Encounter (Signed)
The patient last received this medication at the requesting pharmacy on 10/18/2014 #28-Not in current med list

## 2014-11-03 NOTE — Telephone Encounter (Signed)
Last filled 10/18/2014 --> Routing to Dr. Charma Igo who has appt with patient 11/07/2014    Refused refill --> Patient has enough until upcoming appt     Aspirin d/c'd off med list 12/24/2013 by Dr. Jane Canary:    "Patient on ASA and statin however not clear to me that he meets current criteria for these medications and this may help simplify his regimen. Framingham risk score =2%, and based on his risk level, 2% of individuals would experience benefit from aspirin over a ten year period (2/100 over ten years). This is similar for a statin. At this point it seems reasonable to stop his aspirin and even his statin.   -d/c aspirin and call [name]  -d/c statin and call [name]"    At subsequent 03/16/2014 and 09/07/2014 visits, it's noted that he is "not on ASA"

## 2014-11-07 ENCOUNTER — Ambulatory Visit (HOSPITAL_BASED_OUTPATIENT_CLINIC_OR_DEPARTMENT_OTHER): Payer: No Typology Code available for payment source | Attending: Internal Medicine | Admitting: Internal Medicine

## 2014-11-07 ENCOUNTER — Encounter (HOSPITAL_BASED_OUTPATIENT_CLINIC_OR_DEPARTMENT_OTHER): Payer: Self-pay | Admitting: Internal Medicine

## 2014-11-07 VITALS — BP 156/48 | HR 70 | Temp 97.5°F | Resp 16 | Ht 64.0 in | Wt 181.0 lb

## 2014-11-07 DIAGNOSIS — I159 Secondary hypertension, unspecified: Secondary | ICD-10-CM | POA: Insufficient documentation

## 2014-11-07 DIAGNOSIS — I10 Essential (primary) hypertension: Secondary | ICD-10-CM | POA: Insufficient documentation

## 2014-11-07 DIAGNOSIS — K219 Gastro-esophageal reflux disease without esophagitis: Secondary | ICD-10-CM | POA: Insufficient documentation

## 2014-11-07 DIAGNOSIS — E118 Type 2 diabetes mellitus with unspecified complications: Secondary | ICD-10-CM | POA: Insufficient documentation

## 2014-11-07 LAB — BASIC METABOLIC PANEL
Anion Gap: 8 (ref 4–12)
Calcium: 10 mg/dL (ref 8.9–10.2)
Carbon Dioxide, Total: 30 meq/L (ref 22–32)
Chloride: 101 meq/L (ref 98–108)
Creatinine: 1 mg/dL (ref 0.51–1.18)
GFR, Calc, African American: >60 mL/min (ref >59)
GFR, Calc, European American: >60 mL/min (ref >59)
Glucose: 102 mg/dL (ref 62–125)
Potassium: 3.6 meq/L (ref 3.6–5.2)
Sodium: 139 meq/L (ref 135–145)
Urea Nitrogen: 14 mg/dL (ref 8–21)

## 2014-11-07 LAB — ALBUMIN/CREATININE RATIO, RANDOM URINE
Albumin (Micro), URN: 3.35 mg/dL
Albumin/Creatinine Ratio, URN: 11 mg/g{creat} (ref <30)
Creatinine/Unit, URN: 299 mg/dL

## 2014-11-07 MED ORDER — RANITIDINE HCL 150 MG OR TABS
150.0000 mg | ORAL_TABLET | Freq: Two times a day (BID) | ORAL | Status: DC
Start: 2014-11-07 — End: 2015-04-25

## 2014-11-07 MED ORDER — METFORMIN HCL 1000 MG OR TABS
1000.0000 mg | ORAL_TABLET | Freq: Two times a day (BID) | ORAL | Status: DC
Start: 2014-11-07 — End: 2015-04-25

## 2014-11-07 NOTE — Patient Instructions (Signed)
It was a pleasure seeing you in clinic today.    Please bring in all your medicines to your next clinic visit.  We will start a new cholesterol medicine today.  We will also recheck your blood sugar.      General Info:  -Scheduling: [place] desk [phone]  -Medication review: Please bring your current medication bottles or a list of your medications at each visit  -Medication refills: If you need refills, you or your pharmacy should contact us. It takes 3 work days to process refill requests. Our pharmacy phone number is [phone].  -Lab hours:  7:30 AM to 5:30 PM.

## 2014-11-07 NOTE — Progress Notes (Signed)
[facility] Medicine Clinic - Outpatient Initial Clinic Note    DATE: 11/07/2014     Justin Zavala is a 49 year old male with developmental delay, schizophrenia, GERD, and HTN who is here today to establish care. An interpreter was not needed for the visit.    PROBLEM LIST:  Patient Active Problem List    Diagnosis Date Noted   . Schizophrenia ([place]) 09/13/2013     Per chart review  Seen at [facility].     . GERD (gastroesophageal reflux disease) 09/13/2013   . Hypertension 07/08/2013   . Health care maintenance 04/22/2011     CV:  Not on ASA  Lipids: Not on a statin   10 year ASCVD  5-9 % (depending on if "diabetic")   Would qualify for high dose statin if "diabetic" (otherwise not)   CHOLESTEROL      179   03/16/2014   HDL       35   03/16/2014   LDL      130   03/16/2014   TRIGLYCERIDE       70   03/16/2014  Glucose:    A1C      6.0   03/16/2014    No components found with this name: cre   ALBCREATRATU       11   11/07/2014   Eye Exam:   Done at OSH(?)   Foot exam:  Equivocal as pt always states he feels monofilament (even when not touching)      On statin: No  AAA:    N/A, never smoker  CA:    Colon: Needs when 52  ID:    HIV:    Hepatitis: (Hep C if born btn [place])       HAV:       HBV:       HCV: nonreactive 02/12/13   TB:   Tdap:  2011     Pneumonia: PPSV23 2003   Flu: 03/2014   Zoster:  Needs when 62Bone:  N/A  Lifestyle:   EtOH:   Tobacco:   Illicits:   Sexual health:   Exercise:  EOL:   Social: [name] has talked with patient's sister, they will work with patient after first of year to look at [facility].  Doroteo Bradford, patient's sister was looking at [facility] per [name].  Of note, there were previously food safety concerns, [name] and sister alerted 03/2014.      [name] Extrinsic asthma 01/04/2011   . 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 Should be called after appointments to update him on any changes  Mother [name] can be reached at [phone]   [name] can be reached at work number [phone]     . Type 2 diabetes mellitus with complication ([place]) 62/70/3500     A1c: 08/2012 6.0%, 02/2013 5.7%, 08/2013 5.8%, 03/2014 6.0%, stable  Current Meds:  Metformin   Lipids:  See health care maintenance  Albuminuria: 9 (12/2013)   Retinopathy: seen by optho 04/2011- [facility]- outside provider.  Neuropathy/Footexam: Inconsistent reports by patient, endorses "feeling it" when monofilament not touching skin.         CHIEF COMPLAINT: Establish Care    INTERVAL HISTORY/HPI:    Justin Zavala is a 49 year old male with developmental delay, schizophrenia, GERD, and HTN who is here today to establish care    T2DM: Endorses good adherence with metformin. Proudly states that  while he "slipped yesterday and had soda," he did not have cake. Denies vision changes, peripheral anesthesia/paresthesia, changes in urine. States he sees an eye doctor but cannot elaborate on diagnoses.    Asthma: States he uses both his inhalers "as needed." Shows me beclomethasone and albuterol.     Mental Health: Reports he sees his CM Sammuel Hines twice a week and sees a psychiatrist every 6 months. Unable to elaborate on diagnoses, but states he receives a shot every month and takes a PO medication. Tells me that he attends "groups" every day and feels safe in his currently living situation.     HTN: Unable to confirm or deny adherence with antihypertensives (Per chart review, should be on metoprolol succinate 12.5 mg daily, Lisinopril 40mg , HCTZ 25 mg -- amlodipine stopped d/t LE edema). Denies HA, CP, palpitations, dizziness.      Social History     Social History Narrative    Has fairly significant developmental delay.     States that he has a "fiance" that is his girlfriend. They are not sexually active and do not plan to be. She has a child which he says he has been helping care for.  Has a caregiver that comes 2 x/week.  On SSI and has a case Freight forwarder that helps him (patrick). Also has a  second caregiver.        PAST MEDICAL HISTORY:  Past Medical History   Diagnosis Date   . Chronic obstructive asthma, unspecified    . Allergic rhinitis, cause unspecified    . Unspecified essential hypertension    . Lipidoses    . Other specified types of schizophrenia, unspecified condition ([place])    . 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 Diabetes mellitus ([place])      MEDICATIONS:  Current Outpatient Prescriptions   Medication Sig Dispense Refill   . Albuterol Sulfate HFA 108 (90 BASE) MCG/ACT Inhalation Aero Soln Inhale 2 puffs by mouth every 6 hours as needed. For shortness of breath. 1 Inhaler 6   . Beclomethasone Dipropionate 80 MCG/ACT Inhalation Aero Soln Inhale 2 puffs by mouth 2 times a day. Even if symptoms are no present 1 Inhaler 3   . Clotrimazole 1 % External Cream Apply to feet 2 times a day for fungal rash 45 g 3   . Divalproex Sodium 500 MG Oral Tab EC take 1.5 pills qhs, FROM [facility]     . Hydrochlorothiazide 25 MG Oral Tab TAKE 1 TABLET BY MOUTH DAILY 28 tablet 2   . Ketoconazole 2 % External Cream Apply to affected area on foot or feet 2 times a day. 30 g 1   . Lisinopril 40 MG Oral Tab TAKE 1 TABLET BY MOUTH DAILY (40 MG) 28 tablet 2   . Loratadine 10 MG Oral Tab TAKE 1 TABLET BY MOUTH DAILY 28 tablet 2   . MetFORMIN HCl 1000 MG Oral Tab TAKE 1 TABLET BY MOUTH TWICE A DAY 56 tablet 5   . Metoprolol Succinate ER 25 MG Oral TABLET SR 24 HR Take 0.5 tablets (12.5 mg) by mouth daily. Do not chew or crush. 28 tablet 2   . Ranitidine HCl 150 MG Oral Tab TAKE 1 TABLET BY MOUTH TWICE A DAY 56 tablet 5   . RISPERIDONE OR 50mg  injection--by [facility]     . Skin Protectants, Misc. (EUCERIN) External Cream Apply to affected area(s) 1 to 2 times a day for dry skin 454 g  0     No current facility-administered medications for this visit.     ALLERGIES:  Review of patient's allergies indicates:  No Known Allergies  RISK FACTORS:  Substance abuse: No  Tobacco history: Derell reports that  he has never smoked. He has never used smokeless tobacco.  EtOH use: denies  Comments: Reports he is not sexually active and never has been. States he is only interested in women.    FAMILY MEDICAL HISTORY:  Demetric's family history includes Cancer in his mother; Diabetes in his mother; Mental Illness in his sister; Stroke in his mother.    SOCIAL HISTORY:  Living: With SO's 30 year old son  Marital status: single (has GF)  Employment history: Not working, attends days going to "group" and helping clean [employer]    PHYSICAL EXAM:  BP 156/48 mmHg  Pulse 70  Temp(Src) 97.5 F (36.4 C) (Temporal)  Resp 16  Ht 5\' 4"  (1.626 m)  Wt 181 lb (82.101 kg)  BMI 31.05 kg/m2  General: Pleasant, middle-aged gentleman, esting comfortably in chair in NAD.  Skin: Skin color, texture, turgor normal. No rashes or concerning lesions  Head: Normocephalic.   Eyes: Lids/periorbital skin normal, Conjunctivae/corneas clear  Oropharynx: Lips, mucosa, and tongue normal. MMM. Poor dentition, missing several teeth.   Lungs: No increased WOB  Heart: Normal rate, regular rhythm. Peripheral pulses intact. No LE edema.  Abd: Soft, non-tender. BS normal. No masses or organomegaly  NEURO: Alert and oriented, MAE, gait wnl.  Foot exam:    - Visual inspection:  normal to inspection, with intact skin, no significant callus formation, no evidence of ischemia   - Monofilament exam:  Equivocal, endorses "feeling it" regardless of whether monofilament is touching skin.    - Pulse exam: normal     LAB RESULTS:  No visits with results within 6 Month(s) from this visit.  Latest known visit with results is:    Office Visit on 03/16/2014   Component Date Value   . Hemoglobin A1C 03/16/2014 6.0    . Cholesterol (Total) 03/16/2014 179    . Triglyceride 03/16/2014 70    . Cholesterol (HDL) 03/16/2014 35*   . Cholesterol (LDL) 03/16/2014 130*   . Non-HDL Cholesterol 03/16/2014 144    . Cholesterol/HDL Ratio 03/16/2014 5.1    . Lipid Panel, Additional *  03/16/2014 (NOTE)    . Valproic Acid, Free (Sen* 03/16/2014 <3*   . Valproic Acid, Total (Se* 03/16/2014 10*   . Sodium 03/16/2014 137    . Potassium 03/16/2014 3.3*   . Chloride 03/16/2014 103    . Carbon Dioxide, Total 03/16/2014 29    . Anion Gap 03/16/2014 5    . Glucose 03/16/2014 94    . Urea Nitrogen 03/16/2014 10    . Creatinine 03/16/2014 0.94    . Protein (Total) 03/16/2014 6.8    . Albumin 03/16/2014 4.6    . Bilirubin (Total) 03/16/2014 1.4*   . Calcium 03/16/2014 9.7    . AST (GOT) 03/16/2014 24    . Alkaline Phosphatase (To* 03/16/2014 87    . ALT (GPT) 03/16/2014 25    . GFR, Calc, European Amer* 03/16/2014 >60    . GFR, Calc, African Ameri* 03/16/2014 >60          ASSESSMENT AND PLAN:    Justin Zavala is a 49 year old male with developmental delay, schizophrenia, GERD, and HTN who is here today to establish care    T2DM: A1C 6.0 in 03/2014  on metformin 1000mg  BID and without microalbuminuria (9 in 12/2013), though monofilament testing difficult due to developmental delay and no recent Opthalmology evaluations here.   -Continue metformin 1000mg  BID   -Urine alb/cr today   -A1C today   -Will call CM to discuss Opthalmology evals (done elsewhere)    >Could simply expedite initial eval with fundal imaging here    Asthma: Appears to be well controlled as using both beclomethasone as needed and infrequently.   -Consider stopping beclomethasone    Mental Health and Developmental Delay: Seen at [facility] for schizophrenia per chart review and receives valproate and risperidone. Sees CM Sammuel Hines twice a week, psychiatrist every 6 months, and attends daily "groups." While his insight and judgement is difficult to assess given concomitant developmental delay, he does not appear to be responding to internal stimuli and it appears he has managed to live independently and relatively safely for a long period of time and this is consistent with his GOC and that of his family (per chart review). I do  question his medication adherence however.    -F/u CM [name] (update regarding clinic visit as well as prior discussions regarding AFH?)   -F/u pt's family regarding home safety, thoughts about AFH (per chart review, had discussed previously)    HTN: Unable to confirm or deny adherence with 3-drug antihypertensive regimen (Per chart review, should be on metoprolol succinate 12.5 mg daily, Lisinopril 40mg , HCTZ 25 mg -- amlodipine stopped d/t LE edema).  Asked him to bring all meds to clinic each visit. While mildly hypertensive today, will not make medication adjustments given unclear adherence.   -Urine alb/cr   -F/u medication adherence   -?Continue metoprolol succinate 12.5 mg daily, lisinopril 40mg  daily, HCTZ 25 mg daily    GERD: Stable on ranitidine 150mg  BID   -Continue ranitidine 150mg  BID    HCM: Discussed potentially benefiting from a statin given ASCVD of 11-20% (depending of if "diabetic" or not), and he states he would be amenable to starting a statin if indicated.   -A1C       RTC: 2 months  Discussed with: Dr. Cassandria Santee, MD  Internal Medicine, PGY3

## 2014-11-08 ENCOUNTER — Telehealth (HOSPITAL_BASED_OUTPATIENT_CLINIC_OR_DEPARTMENT_OTHER): Payer: Self-pay | Admitting: Internal Medicine

## 2014-11-08 LAB — HEMOGLOBIN A1C, HPLC: Hemoglobin A1C: 5.4 % (ref 4.0–6.0)

## 2014-11-08 NOTE — Telephone Encounter (Signed)
(  TEXTING IS AN OPTION FOR [facility] CLINICS ONLY)  Is this a [place] clinic? No      RETURN CALL: Detailed message on voicemail only      SUBJECT:  General Message     REASON FOR REQUEST: Case manager returning provider call    MESSAGE: [name], Case manager at [facility] returning call. The best time to reach him  Would be tomorrow (11/09/14 - 11am-4pm) or (11/10/14 - 9am-4pm). Ph # [phone]. See previous closed TE.

## 2014-11-08 NOTE — Telephone Encounter (Signed)
Called and left a message for [name] ([name] CM, [phone]) updating him that I am [name] new PCP. Stated that we didn't make any medication changes, but I had questions about his medication adherence/regimen as [name] was unable to tell me about his medications at our visit. Also let him know that at some point I would like to talk more about what conversations have been had in the past about [name] living situation (independently vs considering AFH).

## 2014-11-11 ENCOUNTER — Other Ambulatory Visit: Payer: Self-pay | Admitting: Internal Medicine

## 2014-11-11 NOTE — Telephone Encounter (Signed)
Informed [place] via fax to disconinue ASA

## 2014-11-11 NOTE — Progress Notes (Signed)
ATTENDING NOTE  I have personally discussed the case with Sarah A McGuffin, MD during or immediately after the patient visit including review of history, physical exam, diagnosis, and treatment plan. I agree with the assessment and plan of care.     Osric Chrisean Kloth, MD

## 2014-11-11 NOTE — Telephone Encounter (Signed)
The patient last received this medication at the requesting pharmacy on 10/18/2014 #28

## 2014-11-30 ENCOUNTER — Other Ambulatory Visit: Payer: Self-pay | Admitting: Internal Medicine

## 2014-11-30 DIAGNOSIS — I10 Essential (primary) hypertension: Secondary | ICD-10-CM

## 2014-11-30 MED ORDER — METOPROLOL SUCCINATE ER 25 MG OR TB24
12.5000 mg | EXTENDED_RELEASE_TABLET | Freq: Every day | ORAL | Status: DC
Start: 2014-11-30 — End: 2015-02-23

## 2014-11-30 NOTE — Telephone Encounter (Signed)
Refill request received for Metoprolol ER 25mg  daily.  The dose was reduced to 12.5mg  daily on 09/07/14 but pharmacy made an error and patient has been given 25mg  daily. The pharmacy packages the medication for patient.     Continue 25mg  daily or reduce now to 12.5mg ?

## 2014-11-30 NOTE — Telephone Encounter (Signed)
Please decrease to metoprolol succinate ER to 12.5mg  daily

## 2014-11-30 NOTE — Telephone Encounter (Signed)
The patient last received this medication at the requesting pharmacy on 11/14/2014 #28

## 2014-12-29 ENCOUNTER — Other Ambulatory Visit: Payer: Self-pay | Admitting: Internal Medicine

## 2014-12-29 DIAGNOSIS — I10 Essential (primary) hypertension: Secondary | ICD-10-CM

## 2014-12-29 DIAGNOSIS — J309 Allergic rhinitis, unspecified: Secondary | ICD-10-CM

## 2014-12-29 NOTE — Telephone Encounter (Signed)
The patient last received these medications at the requesting pharmacy on 12/08/2014

## 2014-12-30 MED ORDER — LISINOPRIL 40 MG OR TABS
ORAL_TABLET | ORAL | Status: DC
Start: 2014-12-30 — End: 2015-06-14

## 2014-12-30 MED ORDER — HYDROCHLOROTHIAZIDE 25 MG OR TABS
ORAL_TABLET | ORAL | Status: DC
Start: 2014-12-30 — End: 2015-06-14

## 2014-12-30 MED ORDER — LORATADINE 10 MG OR TABS
ORAL_TABLET | ORAL | Status: DC
Start: 2014-12-30 — End: 2015-06-14

## 2015-01-13 ENCOUNTER — Encounter (HOSPITAL_BASED_OUTPATIENT_CLINIC_OR_DEPARTMENT_OTHER): Payer: No Typology Code available for payment source | Admitting: Internal Medicine

## 2015-01-20 ENCOUNTER — Encounter (HOSPITAL_BASED_OUTPATIENT_CLINIC_OR_DEPARTMENT_OTHER): Payer: Self-pay | Admitting: Internal Medicine

## 2015-01-20 ENCOUNTER — Ambulatory Visit (HOSPITAL_BASED_OUTPATIENT_CLINIC_OR_DEPARTMENT_OTHER): Payer: No Typology Code available for payment source | Attending: Internal Medicine | Admitting: Internal Medicine

## 2015-01-20 VITALS — BP 141/64 | HR 90 | Temp 97.2°F | Ht 64.0 in | Wt 182.0 lb

## 2015-01-20 DIAGNOSIS — J45909 Unspecified asthma, uncomplicated: Secondary | ICD-10-CM | POA: Insufficient documentation

## 2015-01-20 DIAGNOSIS — Z23 Encounter for immunization: Secondary | ICD-10-CM | POA: Insufficient documentation

## 2015-01-20 MED ORDER — ALBUTEROL SULFATE HFA 108 (90 BASE) MCG/ACT IN AERS
2.0000 | INHALATION_SPRAY | Freq: Four times a day (QID) | RESPIRATORY_TRACT | Status: DC | PRN
Start: 2015-01-20 — End: 2015-07-10

## 2015-01-20 NOTE — Progress Notes (Signed)
[facility] - Outpatient Return Clinic Note    DATE: 01/20/2015     CHIEF COMPLAINT: Diabetes; Blood Pressure; and Refill Request    INTERVAL HISTORY/HPI:    Justin Zavala is a 49 year old male with developmental delay, schizophrenia, GERD, and HTN who is here today to follow-up on these issues.    Since the last clinic visit on 11/07/2014, he reports he has been well. He reports his oldest brother came to visit recently, and he is looking forward to going to [place] in December to visit his dad. He denies any health questions or concerns.    T2DM: Endorses good adherence with metformin and A1C 5.4 at last visit (11/2014). Denies adverse effects. Denies vision changes, peripheral anesthesia/paresthesia, changes in urine. States he is surprised but would like to continue taking metformin as he is "obese."    Asthma: States his asthma is stable as long as he doesn't "over do it." States he has run out of both beclomethasone and albuterol.     Mental Health: Reports he sees his CM Sammuel Hines twice a week and sees a psychiatrist every 6 months. Unable to elaborate on diagnoses, but states he receives a shot every month and takes a PO medication. Tells me that he attends "groups" every day and feels safe in his currently living situation.  States his mood is "pretty good" and he "hasn't ordered anything of the TV lately." States his sleep and appetite is good, and is busy with social activities. Denies SI, HI. Denies hearing or seeing things that others don't hear/see.  Does report that the holidays will be hard as this is the first holiday he will have without her; but he hopes being with family will help.    HTN: When listed, able to confirm that he's taking metoprolol succinate 12.5 mg daily, Lisinopril 40mg , HCTZ 25 mg. Denies HA, CP, palpitations, dizziness.    Social History     Social History Narrative    Has fairly significant developmental delay.     Lives with roommates.  Has caregivers that come almost  daily to help with cleaning, cooking, and other chores. They set up his medications every week.     States that he has a "fiance" that is his girlfriend. They are not sexually active and do not plan to be. She has a child which he says he has been helping care for.     On SSI and has a case Freight forwarder that helps him (patrick).         PHYSICAL EXAM:  BP 141/64 mmHg  Pulse 90  Temp(Src) 97.2 F (36.2 C) (Temporal)  Ht 5\' 4"  (1.626 m)  Wt 182 lb (82.555 kg)  BMI 31.22 kg/m2    General: Pleasant, middle-aged gentleman, resting comfortably in chair in NAD.  Skin: Skin color, texture, turgor normal. No rashes or concerning lesions   Eyes: Lids/periorbital skin normal, Conjunctivae/corneas clear  Oropharynx: Lips, mucosa, and tongue normal. MMM. Poor dentition, missing several teeth.   Lungs: No increased WOB  Heart: Normal rate, regular rhythm. Peripheral pulses intact. No LE edema.  PSYCHIATRIC:      Appearance: Appears stated age; attire, grooming, eye contact wnl    Behavior: Attentive and cooperative, no psychomotor agitation/retardation     Mood/affect:  "good"/ Euthymic, full/normal range    Judgement/insight:  fair/partial    Cognitive function:  Alert and oriented, attention/concentration wnl, memory wnl    Recent/remote memory: wnl    Speech: Mildly rapid.  Rhythm rhythm, intonatation, volume, and amount wnl.     Thought process: Somewhat circumstantial    Though Content: no preoccupations or delusions    Motor: wnl      LAB RESULTS:  Office Visit on 11/07/2014   Component Date Value   . Hemoglobin A1C 11/07/2014 5.4    . Sodium 11/07/2014 139    . Potassium 11/07/2014 3.6    . Chloride 11/07/2014 101    . Carbon Dioxide, Total 11/07/2014 30    . Anion Gap 11/07/2014 8    . Glucose 11/07/2014 102    . Urea Nitrogen 11/07/2014 14    . Creatinine 11/07/2014 1.00    . Calcium 11/07/2014 10.0    . GFR, Calc, European Amer* 11/07/2014 >60    . GFR, Calc, African Ameri* 11/07/2014 >60    . Creatinine/Unit, URN  11/07/2014 299    . Albumin (Micro), URN 11/07/2014 3.35    . Albumin/Creatinine Ratio* 11/07/2014 11          ASSESSMENT AND PLAN:    Justin Zavala is a 49 year old male with developmental delay, schizophrenia, GERD, and HTN who is here today to follow-up these issues.     T2DM: A1C 6.0 in 03/2014 on metformin 1000mg  BID and without microalbuminuria (9 in 12/2013), though monofilament testing difficult due to developmental delay and no recent Opthalmology evaluations here. Repeat A1C 5.4 in 11/2014.  Discussed discontinuing metformin versus continuing for cardiovascular benefits, and he would prefer to continue his metformin.    -Continue metformin 1000mg  BID   -Fundal imaging at next visit (imaging down today d/t power outage)     Asthma: Appears to be well controlled with beclometasone and albuterol previously (though currently without inhalers).   -Restart albuterol PRN, consider starting beclomethasone pending albuterol use    Mental Health and Developmental Delay: Seen at [facility] for schizophrenia per chart review and receives valproate and risperidone. Sees CM Sammuel Hines twice a week, psychiatrist every 6 months, and attends daily "groups." While his insight and judgement is difficult to assess given concomitant developmental delay, he does not appear to be responding to internal stimuli. Is living with roommates and with near-daily care-giver assistance for cooking, meds, chores. Per chart review and brief discussion today, living independently is consistent with his GOC and that of his family.   -Attempted to call CM [name], number not in service     -Will attempt to update family-- no numbers working    HTN: Relatively normotensive on metoprolol succinate 12.5 mg daily, Lisinopril 40mg , HCTZ 25 mg (amlodipine stopped d/t LE edema). No microalbuminuria. Asked him to bring all meds to clinic each visit.    -Continue metoprolol succinate 12.5 mg daily, lisinopril 40mg  daily, HCTZ 25 mg  daily         >Consider increasing HCTZ if needed    GERD:    -Stable on ranitidine 150mg  BID     HCM:    -Influenza vaccine    RTC: 3 months    Discussed with: Dr. Arta Silence, MD  Internal Medicine, PGY3        PROBLEM LIST:  Patient Active Problem List    Diagnosis Date Noted   . Schizophrenia ([place]) 09/13/2013     Per chart review  Seen at [facility].     . GERD (gastroesophageal reflux disease) 09/13/2013   . Hypertension 07/08/2013   . Health care maintenance 04/22/2011  CV:  Not on ASA  Lipids: Not on a statin   10 year ASCVD 11% (given no longer diabetic)   CHOLESTEROL      179   03/16/2014   HDL       35   03/16/2014   LDL      130   03/16/2014   TRIGLYCERIDE       70   03/16/2014  Glucose:    A1C      6.0   03/16/2014    No components found with this name: cre   ALBCREATRATU       11   11/07/2014   Eye Exam:   Done at OSH(?)   Foot exam:  Equivocal as pt always states he feels monofilament (even when not touching)      On statin: No  AAA:    N/A, never smoker  CA:    Colon: Needs when 52  ID:    HIV:    Hepatitis: (Hep C if born btn [place])       HAV:       HBV:       HCV: nonreactive 02/12/13   TB:   Tdap:  2011     Pneumonia: PPSV23 2003   Flu: 03/2014   Zoster:  Needs when 62  Bone:  N/A  Lifestyle:   EtOH: Denies   Tobacco: Denies   Illicits: Denies   Sexual health: Denies   Exercise:  EOL:        . Extrinsic asthma 01/04/2011   . 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 Should be called after appointments to update him on any changes  Mother [name] can be reached at [phone]   [name] can be reached at work number [phone]     . Type 2 diabetes mellitus with complication ([place]) 84/69/6295     A1c: 08/2012 6.0%, 02/2013 5.7%, 08/2013 5.8%, 03/2014 6.0%, stable  Current Meds:  Metformin   Lipids:  See health care maintenance  Albuminuria: 9 (12/2013)   Retinopathy: seen by optho 04/2011- [facility]- outside  provider.  Neuropathy/Footexam: Inconsistent reports by patient, endorses "feeling it" when monofilament not touching skin.       MEDICATIONS:  Current Outpatient Prescriptions   Medication Sig Dispense Refill   . Albuterol Sulfate HFA 108 (90 BASE) MCG/ACT Inhalation Aero Soln Inhale 2 puffs by mouth every 6 hours as needed. For shortness of breath. 1 Inhaler 6   . Beclomethasone Dipropionate 80 MCG/ACT Inhalation Aero Soln Inhale 2 puffs by mouth 2 times a day. Even if symptoms are no present 1 Inhaler 3   . Clotrimazole 1 % External Cream Apply to feet 2 times a day for fungal rash 45 g 3   . Divalproex Sodium 500 MG Oral Tab EC take 1.5 pills qhs, FROM [facility]     . HydroCHLOROthiazide 25 MG Oral Tab TAKE 1 TABLET BY MOUTH DAILY 28 tablet 5   . Ketoconazole 2 % External Cream Apply to affected area on foot or feet 2 times a day. 30 g 1   . Lisinopril 40 MG Oral Tab TAKE 1 TABLET BY MOUTH DAILY (40 MG) 28 tablet 5   . Loratadine 10 MG Oral Tab TAKE 1 TABLET BY MOUTH DAILY 28 tablet 5   . MetFORMIN HCl 1000 MG Oral Tab Take 1 tablet (1,000 mg) by mouth 2 times a day. 60 tablet 5   .  Metoprolol Succinate ER 25 MG Oral TABLET SR 24 HR Take 0.5 tablets (12.5 mg) by mouth daily. Do not chew or crush. 28 tablet 2   . RaNITidine HCl 150 MG Oral Tab Take 1 tablet (150 mg) by mouth 2 times a day. 60 tablet 5   . RISPERIDONE OR 50mg  injection--by [facility]     . Skin Protectants, Misc. (EUCERIN) External Cream Apply to affected area(s) 1 to 2 times a day for dry skin 454 g 0     No current facility-administered medications for this visit.     ALLERGIES:  Review of patient's allergies indicates:  No Known Allergies

## 2015-01-20 NOTE — Patient Instructions (Signed)
It was a pleasure seeing you in clinic today.    You no longer have diabetes, but we can continue the metformin to help protect your heart.  I will restart one inhaler to use "as needed." If you are using it a lot, we can add another.  We will give you a flu shot today.  Next time you come in, please bring in all your medications.    General Info:  -Scheduling: [place] desk [phone]  -Medication review: Please bring your current medication bottles or a list of your medications at each visit  -Medication refills: If you need refills, you or your pharmacy should contact us. It takes 3 work days to process refill requests. Our pharmacy phone number is [phone].  -Lab hours:  7:30 AM to 5:30 PM.

## 2015-01-20 NOTE — Progress Notes (Signed)
VACCINE SCREENING/ORDER WHEN CONTRAINDICATIONS PRESENT    Order date: 01/20/2015  Ordering provider: Wilfrid Lund, MD  Clinic stock used: NO  Interpreter used?: None  Vaccine information sheet(s) discussed, patient/parent/guardian verbalized understanding? YES   Vaccines given: Influenza  VIS given 01/20/2015 by Val Eagle, MA MA.      SCREENING: INACTIVATED VACCINES  NO 1. Do you have a high fever, severe cold-like symptoms or serious infection?  NO 2. Do you have any food allergies such as eggs, chicken, chicken feathers, chicken dander, or gelatin?  NO 3. Do you have any allergies to neomycin, streptomycin, or polymyxin?   NO 4. Have you had any severe reaction to a vaccine in the past such as a seizure, a convulsion from a high fever, or a paralysis problem called Guillain-Barre Syndrome? If so, which vaccine(s):     Patient left without any complication.Patient tolerated it well.      If the patient/parent/or guardian answered "yes" to any of the above questions, consult with the provider to review the responses prior to administering vaccination. Parents with a contraindication to immunization will not receive the immunization without a specific physician order to vaccinate the patient and discussion of risk and benefits related to the contraindication.     Physician Order for Administration of Vaccine(s) When Contraindications Are Present  I have reviewed the existence in this patient's health history of possible contraindication(s) to administration of vaccine. The benefits to this patient outweigh the potential risks of immunization. Administer vaccine(s):  Val Eagle, [place], 01/20/2015 3:13 PM MA.          Instruction:  Problems that could happen after any vaccine:  . Brief fainting spells can happen after any medical procedure, including vaccination. Sitting or lying down for about 15 minutes can help prevent fainting, and injuries caused by a fall. Tell your doctor if you feel dizzy, or have vision  changes or ringing in the ears.  . Severe shoulder pain and reduced range of motion in the arm where a shot was given can happen, very rarely, after a vaccination.   . Severe allergic reactions from a vaccine are very rare, estimated at less than 1 in a million doses. If one were to occur, it would usually be within a few minutes to a few hours after the vaccination.   Mild problems following all vaccines:   Hoarseness, sore, red or itchy eyes, cough, fever, aches, headache, itching, fatigue   If these problems occur, they usually begin soon after the shot and last 1 or 2 days.   What should I look for?  . Look for anything that concerns you, such as signs of a severe allergic reaction, very high fever, or behavior changes.  Signs of a severe allergic reaction can include hives, swelling of the face and throat, difficulty breathing, a fast heartbeat, dizziness, and weakness. These would usually start a few minutes to a few hours after the vaccination.  What should I do?  . If you think it is a severe allergic reaction or other emergency that can't wait, call 9-1-1 and get the person to the nearest hospital. Otherwise, call your doctor.     [  ] Interpreter used; language  [ x] Patient voiced understanding  Time spent teaching patient:  [  ] 0-4 minutes  [x]  5 minutes  [  ]16-30 minutes  [  ] >30 minutes

## 2015-01-31 NOTE — Progress Notes (Signed)
I have personally discussed the case with Dr. MCGUFFIN, SARAH ANNE during or immediately after the patient visit including review of history, physical exam, diagnosis, and treatment plan. I agree with the assessment and plan of care.

## 2015-02-02 ENCOUNTER — Other Ambulatory Visit (HOSPITAL_BASED_OUTPATIENT_CLINIC_OR_DEPARTMENT_OTHER): Payer: Self-pay | Admitting: Internal Medicine

## 2015-02-02 DIAGNOSIS — E118 Type 2 diabetes mellitus with unspecified complications: Secondary | ICD-10-CM

## 2015-02-03 ENCOUNTER — Ambulatory Visit (HOSPITAL_BASED_OUTPATIENT_CLINIC_OR_DEPARTMENT_OTHER): Payer: No Typology Code available for payment source

## 2015-02-09 ENCOUNTER — Other Ambulatory Visit (HOSPITAL_BASED_OUTPATIENT_CLINIC_OR_DEPARTMENT_OTHER): Payer: Self-pay | Admitting: Unknown Physician Specialty

## 2015-02-09 NOTE — Progress Notes (Signed)
Nurse Care Management Brief Note:    Contacted Via: by telephone    Summary: Pt referred for one time DM education (more visits to be scheduled PRN). Pt with developmental delay and hx of schizophrenia, a1c 6.0, requesting education about diabetes. Called and left VM requesting pt to call me back for scheduling.    Plan: will schedule pt with Silvestre Moment, RN/CDE. Will reach out again if I do not hear back from pt.     Next Follow up: 11/8

## 2015-02-23 ENCOUNTER — Other Ambulatory Visit: Payer: Self-pay | Admitting: Internal Medicine

## 2015-02-23 DIAGNOSIS — I10 Essential (primary) hypertension: Secondary | ICD-10-CM

## 2015-02-23 MED ORDER — METOPROLOL SUCCINATE ER 25 MG OR TB24
12.5000 mg | EXTENDED_RELEASE_TABLET | Freq: Every day | ORAL | Status: DC
Start: 2015-02-23 — End: 2015-08-08

## 2015-03-10 ENCOUNTER — Ambulatory Visit (HOSPITAL_BASED_OUTPATIENT_CLINIC_OR_DEPARTMENT_OTHER): Payer: No Typology Code available for payment source

## 2015-03-24 ENCOUNTER — Ambulatory Visit (HOSPITAL_BASED_OUTPATIENT_CLINIC_OR_DEPARTMENT_OTHER): Payer: No Typology Code available for payment source

## 2015-04-20 ENCOUNTER — Telehealth (HOSPITAL_BASED_OUTPATIENT_CLINIC_OR_DEPARTMENT_OTHER): Payer: Self-pay | Admitting: Internal Medicine

## 2015-04-20 NOTE — Telephone Encounter (Signed)
Called Justin Zavala, patient's CM at [facility] [phone]), to discuss meds. His charts indicate Justin Zavala is to be taking divalproex and paliperidone. He states Justin Zavala is adherent with picking-up up the meds each week (though unclear if he is taking them at home given concerns described below).    He also tells me that the patient's sister has concerns about his home safety and mental health. He reports Justin Zavala has been hoarding raw and cooked food in his apartment, there was a new bug infestation, he has been seen rummaging in dumpsters, has been noted to be more unkempt/malodorous at clinic visits.    He also notes that Justin Zavala seems less engaged in social activities and clinic follow-up.    His CM notes that that patient's mother died within the past 6 months and this may be contributory, versus represent a "cycle" that Justin Zavala is going through as this has apparently happened before.   They hare discussed moving into an AFH and Justin Zavala has declined, though his CM feels such a move would be very beneficial.

## 2015-04-21 ENCOUNTER — Encounter (HOSPITAL_BASED_OUTPATIENT_CLINIC_OR_DEPARTMENT_OTHER): Payer: Self-pay | Admitting: Internal Medicine

## 2015-04-21 ENCOUNTER — Ambulatory Visit (HOSPITAL_BASED_OUTPATIENT_CLINIC_OR_DEPARTMENT_OTHER): Payer: No Typology Code available for payment source | Attending: Internal Medicine | Admitting: Internal Medicine

## 2015-04-21 VITALS — BP 140/98 | HR 92 | Temp 97.7°F | Resp 16 | Ht 64.0 in | Wt 189.6 lb

## 2015-04-21 DIAGNOSIS — F209 Schizophrenia, unspecified: Secondary | ICD-10-CM | POA: Insufficient documentation

## 2015-04-21 LAB — COMPREHENSIVE METABOLIC PANEL
ALT (GPT): 28 U/L (ref 10–64)
AST (GOT): 26 U/L (ref 9–38)
Albumin: 4.9 g/dL (ref 3.5–5.2)
Alkaline Phosphatase (Total): 100 U/L (ref 39–139)
Anion Gap: 6 (ref 4–12)
Bilirubin (Total): 1.6 mg/dL — ABNORMAL HIGH (ref 0.2–1.3)
Calcium: 10 mg/dL (ref 8.9–10.2)
Carbon Dioxide, Total: 29 meq/L (ref 22–32)
Chloride: 100 meq/L (ref 98–108)
Creatinine: 0.95 mg/dL (ref 0.51–1.18)
GFR, Calc, African American: >60 mL/min/{1.73_m2}
GFR, Calc, European American: >60 mL/min/{1.73_m2}
Glucose: 111 mg/dL (ref 62–125)
Potassium: 3.8 meq/L (ref 3.6–5.2)
Protein (Total): 7.5 g/dL (ref 6.0–8.2)
Sodium: 135 meq/L (ref 135–145)
Urea Nitrogen: 11 mg/dL (ref 8–21)

## 2015-04-21 LAB — CBC (HEMOGRAM)
Hematocrit: 41 % (ref 38–50)
Hemoglobin: 14 g/dL (ref 13.0–18.0)
MCH: 29.7 pg (ref 27.3–33.6)
MCHC: 34.5 g/dL (ref 32.2–36.5)
MCV: 86 fL (ref 81–98)
Platelet Count: 194 10*3/uL (ref 150–400)
RBC: 4.72 10*6/uL (ref 4.40–5.60)
RDW-CV: 13.1 % (ref 11.6–14.4)
WBC: 4.34 10*3/uL (ref 4.30–10.00)

## 2015-04-21 NOTE — Patient Instructions (Signed)
It was a pleasure seeing you in clinic today.    We will get blood work today.    Keep thinking about potentially living in an adult family home. They would be able to help with cooking, cleaning, and other chores. It might also be a nice social environment.    General Info:  -Scheduling: [facility] desk [phone]  -Medication review: Please bring your current medication bottles or a list of your medications at each visit  -Medication refills: If you need refills, you or your pharmacy should contact us. It takes 3 work days to process refill requests. Our pharmacy phone number is [phone].  -Lab hours:  7:30 AM to 5:30 PM.

## 2015-04-22 NOTE — Progress Notes (Signed)
Adult Medicine Clinic - Outpatient Return Clinic Note    DATE: 04/22/2015     CHIEF COMPLAINT: Diabetes      INTERVAL HISTORY/HPI:    Justin Zavala is a 50 year old gentleman with developmental delay, schizophrenia, GERD, HTN, and diet controlled T2DM who is here today to follow-up on these issues.    Since the last clinic visit in 01/2015, he reports he has been well. He has no acute questions or concerns today.    Mental health and home safety:  As per my 04/19/14 telephone note, I called Justin Zavala' case manager Justin Zavala to check in on things and learned that he had significant concern about Nnamdi' home safety and overall mental health. In particular he was worried about increased hoarding, decreased hygiene, and decrease social interactions. Today Justin Zavala tells me he feels that things are going well at home. He reports he enjoys having a care giver come for a few hours a day to help cook, but he enjoys doing many chores himself. He reports he very much values his independent living situation, and would not want to move into an adult family home. He reports his mood is "good," he sleeps well, his appetite is good, his thinking is clear, and he does not feel anxious. 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 He enjoys spending time with his fiance.     T2DM: Endorses good adherence with metformin and likes reading about diabets. Denies adverse effects. Denies vision changes, peripheral anesthesia/paresthesia, changes in urine.     Asthma: Denies wheezing or shortness of breath.    HTN: When listed, able to confirm that he's taking metoprolol succinate 12.5 mg daily, Lisinopril 40mg , HCTZ 25 mg. Denies HA, CP, palpitations, dizziness.    Social History     Social History Narrative    Has fairly significant developmental delay.     Lives with roommates.  Has caregivers that come almost daily to help with  cleaning, cooking, and other chores. They set up his medications every week.     States that he has a "fiance" that is his girlfriend. They are not sexually active and do not plan to be. She has a child which he says he has been helping care for.     On SSI and has a case Freight forwarder that helps him (patrick).         PHYSICAL EXAM:  BP 140/98 mmHg  Pulse 92  Temp(Src) 97.7 F (36.5 C) (Temporal)  Resp 16  Ht 5\' 4"  (1.626 m)  Wt 189 lb 9.6 oz (86.002 kg)  BMI 32.53 kg/m2    General: Pleasant, middle-aged gentleman, resting comfortably in chair in NAD. Pile of magazines next to him.  Skin: Skin color, texture, turgor normal. No rashes or concerning lesions   Eyes: Sclerae anicteric.  PSYCHIATRIC:      Appearance: Appears stated age; attire, grooming, eye contact wnl    Behavior: Laughing frequently, somewhat distracted at times. Cooperative, no psychomotor agitation/retardation     Mood/affect:  "good"/ Euthymic, full/normal range    Judgement/insight:  fair/partial    Cognitive function:  Alert and oriented, memory wnl    Recent/remote memory: wnl    Speech: Mildly rapid. Rhythm, intonatation, volume, and amount wnl.     Thought process: Somewhat circumstantial    Though Content: no preoccupations or delusions    Motor: wnl  LAB RESULTS:  Office Visit on 11/07/2014   Component Date Value   . Hemoglobin A1C 11/07/2014 5.4    . Sodium 11/07/2014 139    . Potassium 11/07/2014 3.6    . Chloride 11/07/2014 101    . Carbon Dioxide, Total 11/07/2014 30    . Anion Gap 11/07/2014 8    . Glucose 11/07/2014 102    . Urea Nitrogen 11/07/2014 14    . Creatinine 11/07/2014 1.00    . Calcium 11/07/2014 10.0    . GFR, Calc, European Amer* 11/07/2014 >60    . GFR, Calc, African Ameri* 11/07/2014 >60    . Creatinine/Unit, URN 11/07/2014 299    . Albumin (Micro), URN 11/07/2014 3.35    . Albumin/Creatinine Ratio* 11/07/2014 11        ASSESSMENT AND PLAN:    Justin Cordial is a 50 year old gentleman with developmental delay,  schizophrenia, GERD, HTN, and diet controlled T2DM who is here today to follow-up on these issues.    #Mental Health and Developmental Delay: Appears stable on exam/interview today, though CM Justin Zavala with concerns about hygiene, hoarding, social interaction, particularly in light of his mother's death this past year (summer 2016). Seen at [facility] for schizophrenia per chart review and valproate and risperidone. Sees CM Sammuel Hines twice a week, psychiatrist every 6 months, and attends daily "groups." Currently also working with a DDA CM. While his insight and judgement is difficult to assess given concomitant developmental delay, he does not appear to be responding to internal stimuli. Denies any episodes of mania, decreased sleep, risky behaviors, illicit drug use, or sexual activity. Has a near-daily care-giver assistance for cooking, meds, chores. Per chart review and continued discussion today, living independently is consistent with his GOC and that of his family, though he states I may call his sister (who lives in the same apartment building) and his DDA CM to further discuss.   -Will try to follow-up with DDA CM and patient's sister   -CMP, CBC, valproate level     T2DM: A1C 5.4 in 11/2014 on metformin 1000mg  BID and without microalbuminuria (9 in 12/2013), though monofilament testing difficult due to developmental delay and no recent Opthalmology evaluations here. Have discussed discontinuing metformin versus continuing for cardiovascular benefits, and he prefers to continue his metformin.    -Continue metformin 1000mg  BID   -Ophthalmology referral  (unable to keep eyes open for fundal imaging)    Asthma: Appears to be well controlled with albuterol PRN.   -Continue albuterol PRN.    HTN: Relatively normotensive on metoprolol succinate 12.5 mg daily, Lisinopril 40mg , HCTZ 25 mg (amlodipine stopped d/t LE edema). No microalbuminuria. Asked him to bring all meds to clinic each visit.    -Continue  metoprolol succinate 12.5 mg daily, lisinopril 40mg  daily, HCTZ 25 mg daily         >Consider increasing HCTZ if needed    GERD:    -Stable on ranitidine 150mg  BID     HCM:    -Ophthalmology referral (unable to keep eyes open for fundal imaging)    RTC: 1-2 months    Discussed with: Dr. Arta Silence, MD  Internal Medicine, PGY3        PROBLEM LIST:  Patient Active Problem List    Diagnosis Date Noted   . Schizophrenia ([place]) 09/13/2013     Per chart review  Seen at [facility].     . GERD (gastroesophageal reflux  disease) 09/13/2013   . Hypertension 07/08/2013   . Health care maintenance 04/22/2011     CV:  Not on ASA  Lipids: Not on a statin   10 year ASCVD 9% (given no longer diabetic)   CHOLESTEROL      179   03/16/2014   HDL       35   03/16/2014   LDL      130   03/16/2014   TRIGLYCERIDE       70   03/16/2014  Glucose:    A1C      5.4   11/07/2014    No components found with this name: cre   ALBCREATRATU       11   11/07/2014   Eye Exam:   Done at OSH(?)   Foot exam:  Equivocal as pt always states he feels monofilament (even when not touching)      On statin: No  AAA:    N/A, never smoker  CA:    Colon: Needs when 52  ID:    HIV: neg 2014   Hepatitis: (Hep C if born btn [place])       HAV:       HBV:       HCV: nonreactive 02/12/13   TB:   Tdap:  2011     Pneumonia: PPSV23 2003   Flu: 01/2015   Zoster:  Needs when 62  Bone:  N/A  Lifestyle:   EtOH: Denies   Tobacco: Denies   Illicits: Denies   Sexual health: Denies   Exercise:  EOL:        . Extrinsic asthma 01/04/2011   . Other specified delay in development 05/21/2010     Case manager [name], cell [phone]. Should be called after appointments to update him on any changes  Mother [name] can be reached at [phone]   [name] can be reached at work number [phone]     . Type 2 diabetes mellitus with complication ([place]) 123456     A1c: 5.4% 01/2015  Current Meds:  Metformin   Lipids:  See health care maintenance  Albuminuria: 9  (12/2013)   Retinopathy: seen by optho 04/2011- [facility]- outside provider.  Neuropathy/Footexam: Inconsistent reports by patient, endorses "feeling it" when monofilament not touching skin.       MEDICATIONS:  Current Outpatient Prescriptions   Medication Sig Dispense Refill   . Albuterol Sulfate HFA 108 (90 BASE) MCG/ACT Inhalation Aero Soln Inhale 2 puffs by mouth every 6 hours as needed. For shortness of breath. 1 Inhaler 6   . Clotrimazole 1 % External Cream Apply to feet 2 times a day for fungal rash 45 g 3   . Divalproex Sodium 500 MG Oral Tab EC take 1.5 pills qhs, FROM [facility]     . HydroCHLOROthiazide 25 MG Oral Tab TAKE 1 TABLET BY MOUTH DAILY 28 tablet 5   . Ketoconazole 2 % External Cream Apply to affected area on foot or feet 2 times a day. 30 g 1   . Lisinopril 40 MG Oral Tab TAKE 1 TABLET BY MOUTH DAILY (40 MG) 28 tablet 5   . Loratadine 10 MG Oral Tab TAKE 1 TABLET BY MOUTH DAILY 28 tablet 5   . MetFORMIN HCl 1000 MG Oral Tab Take 1 tablet (1,000 mg) by mouth 2 times a day. 60 tablet 5   . Metoprolol Succinate ER 25 MG Oral TABLET SR 24 HR Take 0.5 tablets (12.5  mg) by mouth daily. 14 tablet 5   . RaNITidine HCl 150 MG Oral Tab Take 1 tablet (150 mg) by mouth 2 times a day. 60 tablet 5   . RISPERIDONE OR 50mg  injection--[facility]     . Skin Protectants, Misc. (EUCERIN) External Cream Apply to affected area(s) 1 to 2 times a day for dry skin 454 g 0     No current facility-administered medications for this visit.     ALLERGIES:  Review of patient's allergies indicates:  No Known Allergies

## 2015-04-25 ENCOUNTER — Other Ambulatory Visit (HOSPITAL_BASED_OUTPATIENT_CLINIC_OR_DEPARTMENT_OTHER): Payer: Self-pay | Admitting: Internal Medicine

## 2015-04-25 DIAGNOSIS — K219 Gastro-esophageal reflux disease without esophagitis: Secondary | ICD-10-CM

## 2015-04-25 DIAGNOSIS — E118 Type 2 diabetes mellitus with unspecified complications: Secondary | ICD-10-CM

## 2015-04-26 MED ORDER — METFORMIN HCL 1000 MG OR TABS
ORAL_TABLET | ORAL | Status: DC
Start: 2015-04-26 — End: 2015-06-14

## 2015-04-26 MED ORDER — RANITIDINE HCL 150 MG OR TABS
ORAL_TABLET | ORAL | Status: DC
Start: 2015-04-26 — End: 2015-06-14

## 2015-04-27 LAB — VALPROIC ACID, FREE AND TOTAL
Valproic Acid, Free (Sendout): <3 ug/mL — ABNORMAL LOW (ref 5–25)
Valproic Acid, Total (Sendout): <3 ug/mL — ABNORMAL LOW (ref 50–125)

## 2015-04-27 NOTE — Progress Notes (Signed)
I have personally discussed the case with Dr. MCGUFFIN, SARAH ANNE during or immediately after the patient visit including review of history, physical exam, diagnosis, and treatment plan. I agree with the assessment and plan of care.

## 2015-04-28 ENCOUNTER — Ambulatory Visit (HOSPITAL_BASED_OUTPATIENT_CLINIC_OR_DEPARTMENT_OTHER): Payer: No Typology Code available for payment source | Attending: Internal Medicine | Admitting: Registered"

## 2015-04-28 VITALS — Ht 64.0 in | Wt 180.4 lb

## 2015-04-28 DIAGNOSIS — E118 Type 2 diabetes mellitus with unspecified complications: Secondary | ICD-10-CM | POA: Insufficient documentation

## 2015-04-28 NOTE — Patient Instructions (Signed)
1) only watch tv from 830- 10pm.  Listen to music and dance at other times  2) choose healthy meals and foods- see menu plan and grocery lists

## 2015-04-28 NOTE — Progress Notes (Signed)
Nutrition Note    Clinic:  Adult Medicine    Note Type: Reassessment last seen 02/25/14    Referred by: Dr Sheila Oats  Same day verbal order read back: No  Date referred: 02/02/15    Reason For Visit: Weight management and Diabetes Management    An interpreter was not needed for the visit.    Assessment    Medical Diagnosis:  Patient Active Problem List   Diagnosis   . Type 2 diabetes mellitus with complication ([place])   . 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 Extrinsic asthma   . Health care maintenance   . Hypertension   . Schizophrenia ([place])   . GERD (gastroesophageal reflux disease)     Weight History:   Vitals Height Weight BMI (kg/m2)   04/28/2015 5\' 4"  180 lb 6.4 oz 30.97     Adult BMI Classification: obese categ. I (30- 34.9)    Last RD visit  Vitals Height Weight BMI (kg/m2)   02/25/2014 5\' 4"  183 lb 14.4 oz 31.57     Initial wt: 189.4lb (04/2008)    Labs:  DIABETES [facility] AMB A1C   Latest Ref Rng 4.0 - 6.0 %   04/16/2010 10.4 (H)   04/22/2011 6.2 (H)   06/05/2012 6.3 (H)   07/08/2013 5.8   03/16/2014 6.0   11/07/2014 5.4     Self monitored blood glucose: not discussed     Current Outpatient Prescriptions   Medication Sig Dispense Refill   . Albuterol Sulfate HFA 108 (90 BASE) MCG/ACT Inhalation Aero Soln Inhale 2 puffs by mouth every 6 hours as needed. For shortness of breath. 1 Inhaler 6   . Clotrimazole 1 % External Cream Apply to feet 2 times a day for fungal rash 45 g 3   . Divalproex Sodium 500 MG Oral Tab EC take 1.5 pills qhs, [facility]     . HydroCHLOROthiazide 25 MG Oral Tab TAKE 1 TABLET BY MOUTH DAILY 28 tablet 5   . Ketoconazole 2 % External Cream Apply to affected area on foot or feet 2 times a day. 30 g 1   . Lisinopril 40 MG Oral Tab TAKE 1 TABLET BY MOUTH DAILY (40 MG) 28 tablet 5   . Loratadine 10 MG Oral Tab TAKE 1 TABLET BY MOUTH DAILY 28 tablet 5   . MetFORMIN HCl 1000 MG Oral Tab TAKE 1 TABLET BY MOUTH TWICE A DAY 56 tablet 1   . Metoprolol Succinate ER 25 MG Oral TABLET SR 24 HR Take  0.5 tablets (12.5 mg) by mouth daily. 14 tablet 5   . RaNITidine HCl 150 MG Oral Tab TAKE 1 TABLET BY MOUTH TWICE A DAY 56 tablet 1   . RISPERIDONE OR 50mg  injection--by [facility]     . Skin Protectants, Misc. (EUCERIN) External Cream Apply to affected area(s) 1 to 2 times a day for dry skin 454 g 0     No current facility-administered medications for this visit.     Vitamins/Minerals/Herbal Supplements: not discussed    Nutrition requirements: not discussed    Barriers to adequate intake: None    GI tolerance: None    Social History   Substance Use Topics   . Smoking status: Never Smoker    . Smokeless tobacco: Never Used   . Alcohol Use: No     Activity: mentions that he no longer is able to play basketball "I can't run up the court".  He does still go  bowling.  Also mentions that he likes to dance    Special/alternative diet: None    Food intolerances/allergies: None    Diet History:   Pt reports that he had to throw out salad he got from the food bank because he forgot to eat it and it went bad.  He does like salads, also likes pasta- made batch on Monday and still eating.  He does have m&m's in his freezer.  He repackaged them as we discussed before.  However it is not clear how small the packages are.  Denies any cheetos but does eat potato chips 1 large bag a week.    Past diet recall (10/11/13):   B: breakfast sandwich  L: leftovers or sandwich; today had 2 slices of pizza  D: 1 slice pizza last night w/ some apple slices    No longer getting bread    Beverages: diet pop    Information from Patient:   - Asks for grocery lists and meal plans we created before  - Sad his mom died last year, reports the holidays were hard.  He is still waiting for his sisters to do some sort of ceremony at the [place].  He can't remember what it is called  - [name] no longer his case manager but still in charge of his money  - Aida Puffer is care giver is here w/ him today.  She helps prepare meals, clean, and do laundry.   She says there a lots of bugs in his house.  Jeneen Rinks carrying bag full of magazine with him.  He is only buying one a month.    - Says he would like to watch less tv.  Makes goal to only watch from 830-10pm and to listen to music the rest of time.  He likes the idea of dancing to the music    Assessment Summary:   -3.5llb weight loss since our last appt.   - decrease in A1c suggests improved diabetes management.    Justin Zavala has followed up on some of the recommendations from our previous visits- packaged m&m's into individual servings.    - Glad his care giver is with him today so that hopefully he will be able to follow through with healthy meals and groceries.    Nutrition Diagnosis: Inability to manage self care  related to develomental delay evidenced by difficulty sticking to recommendations.    Diagnosis Reassessment:  _ Resolved (nutrition problem no longer exists)   x_ Improvement shown (Nutrition problem still exists).  _Unresolved no improvement shown:    _ No longer appropriate (change in condition)    Interventions:    - Provide [place] with grocery lists and meal plan  - Agree w/ his plan to watch less tv      Education Provided:  Please refer to Education section of chart     Patient stated goals:  1) only watch tv from 830- 10pm.  Listen to music and dance at other times  2) choose healthy meals and foods- see menu plan and grocery lists.  Going to the store the beginning of February when he has money      Nutrition topics taught:   None    Education materials provided:  -Grocery lists x10  - meal plan    Time with patient: 66mins    Plan/Recommendations    Follow up:    06/02/15    Plan:    See goals above      Total  Time Spent: 45 mins    Ed Blalock, MS, RD, CD, CDE  [facility]  [place]  [address] [place] [address]  Pager: [phone]

## 2015-06-02 ENCOUNTER — Ambulatory Visit (HOSPITAL_BASED_OUTPATIENT_CLINIC_OR_DEPARTMENT_OTHER): Payer: No Typology Code available for payment source | Attending: Internal Medicine | Admitting: Registered"

## 2015-06-02 ENCOUNTER — Encounter (HOSPITAL_BASED_OUTPATIENT_CLINIC_OR_DEPARTMENT_OTHER): Payer: No Typology Code available for payment source | Admitting: Internal Medicine

## 2015-06-02 VITALS — Ht 64.0 in | Wt 178.4 lb

## 2015-06-02 DIAGNOSIS — E118 Type 2 diabetes mellitus with unspecified complications: Secondary | ICD-10-CM | POA: Insufficient documentation

## 2015-06-02 NOTE — Progress Notes (Signed)
Nutrition Note    Clinic:  Adult Medicine    Note Type: Reassessment last seen 02/25/14    Referred by: Dr Sheila Oats  Same day verbal order read back: No  Date referred: 02/02/15    Reason For Visit: Weight management and Diabetes Management    An interpreter was not needed for the visit.    Assessment    Medical Diagnosis:  Patient Active Problem List   Diagnosis   . Type 2 diabetes mellitus with complication ([place])   . 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 Extrinsic asthma   . Health care maintenance   . Hypertension   . Schizophrenia ([place])   . GERD (gastroesophageal reflux disease)     Weight History:   Vitals Height Weight BMI (kg/m2)   06/02/2015 5\' 4"  178 lb 6.4 oz 30.62     Adult BMI Classification: obese categ. I (30- 34.9)    Last RD visit  Vitals Height Weight BMI (kg/m2)   04/28/2015 5\' 4"  180 lb 6.4 oz 30.97     Initial RD visit  Vitals Height Weight BMI (kg/m2)   02/25/2014 5\' 4"  183 lb 14.4 oz 31.57     Initial wt: 189.4lb (04/2008)    Labs:  DIABETES [facility] AMB A1C   Latest Ref Rng 4.0 - 6.0 %   04/16/2010 10.4 (H)   04/22/2011 6.2 (H)   06/05/2012 6.3 (H)   07/08/2013 5.8   03/16/2014 6.0   11/07/2014 5.4     Self monitored blood glucose: not discussed     Current Outpatient Prescriptions   Medication Sig Dispense Refill   . Albuterol Sulfate HFA 108 (90 BASE) MCG/ACT Inhalation Aero Soln Inhale 2 puffs by mouth every 6 hours as needed. For shortness of breath. 1 Inhaler 6   . Clotrimazole 1 % External Cream Apply to feet 2 times a day for fungal rash 45 g 3   . Divalproex Sodium 500 MG Oral Tab EC take 1.5 pills qhs, [facility]     . HydroCHLOROthiazide 25 MG Oral Tab TAKE 1 TABLET BY MOUTH DAILY 28 tablet 5   . Ketoconazole 2 % External Cream Apply to affected area on foot or feet 2 times a day. 30 g 1   . Lisinopril 40 MG Oral Tab TAKE 1 TABLET BY MOUTH DAILY (40 MG) 28 tablet 5   . Loratadine 10 MG Oral Tab TAKE 1 TABLET BY MOUTH DAILY 28 tablet 5   . MetFORMIN HCl 1000 MG Oral Tab TAKE 1 TABLET  BY MOUTH TWICE A DAY 56 tablet 1   . Metoprolol Succinate ER 25 MG Oral TABLET SR 24 HR Take 0.5 tablets (12.5 mg) by mouth daily. 14 tablet 5   . RaNITidine HCl 150 MG Oral Tab TAKE 1 TABLET BY MOUTH TWICE A DAY 56 tablet 1   . RISPERIDONE OR 50mg  injection--by [facility]     . Skin Protectants, Misc. (EUCERIN) External Cream Apply to affected area(s) 1 to 2 times a day for dry skin 454 g 0     No current facility-administered medications for this visit.     Vitamins/Minerals/Herbal Supplements: not discussed    Nutrition requirements: not discussed    Barriers to adequate intake: None    GI tolerance: None    Social History   Substance Use Topics   . Smoking status: Never Smoker    . Smokeless tobacco: Never Used   . Alcohol Use: No  Activity: mentions that he no longer is able to play basketball "I can't run up the court".  He does still go bowling.  Also mentions that he likes to dance    Special/alternative diet: None    Food intolerances/allergies: None    Diet History:     B: cereal and egg  L: sandwich  D: [employer] last night- Justin Zavala.    No longer getting bread    Beverages: diet pop    Information from Patient:   - Asks for grocery lists and restaurant tips  - Justin Zavala makes goal to limit treat foods (chips/pastries) to 1x/mo and [employer] to 1x/mo  - Justin Zavala is going to  [employer] this month.  He plans to only get one pastry  - thinks he could keep working on watching less tv, found it boring to listen to music so would like to try going for a walk instead.    Previous  - Sad his mom died last year, reports the holidays were hard.  He is still waiting for his sisters to do some sort of ceremony at the [place].  He can't remember what it is called  - Justin Zavala no longer his case manager but still in charge of his money  - Justin Zavala is care giver is here w/ him today.  She helps prepare meals, clean, and do laundry.  She says there a lots of bugs in his house.  Justin Zavala carrying bag full of magazine  with him.  He is only buying one a month.    - Says he would like to watch less tv.  Makes goal to only watch from 830-10pm and to listen to music the rest of time.  He likes the idea of dancing to the music    Assessment Summary:   -2llb weight loss since our last appt. And 5.5lb since 02/2015   - decrease in A1c suggests improved diabetes management.      Nutrition Diagnosis: Inability to manage self care  related to develomental delay evidenced by difficulty sticking to recommendations.    Diagnosis Reassessment:  _ Resolved (nutrition problem no longer exists)   x_ Improvement shown (Nutrition problem still exists).  _Unresolved no improvement shown:    _ No longer appropriate (change in condition)    Interventions:    - Provide Justin Zavala with [employer] and restaurant tips  - Agree w/ his plan to watch less tv      Education Provided:  Please refer to Education section of chart     Patient stated goals:  1) only watch tv from 830- 10pm.  Listen to music and dance at other times  2) limit [employer] and treat foods to 1x/mo each    Nutrition topics taught:   None    Education materials provided:  -Grocery lists x10 and restaurant tips    Time with patient: 59mins    Plan/Recommendations    Follow up:    06/02/15    Plan:    See goals above      Total Time Spent: 36 mins    Ed Blalock, MS, RD, CD, CDE  [facility] Dietitian  [place]  [address], [place] [address]  Pager: [phone]

## 2015-06-14 ENCOUNTER — Other Ambulatory Visit (HOSPITAL_BASED_OUTPATIENT_CLINIC_OR_DEPARTMENT_OTHER): Payer: Self-pay | Admitting: Internal Medicine

## 2015-06-14 DIAGNOSIS — E118 Type 2 diabetes mellitus with unspecified complications: Secondary | ICD-10-CM

## 2015-06-14 DIAGNOSIS — J309 Allergic rhinitis, unspecified: Secondary | ICD-10-CM

## 2015-06-14 DIAGNOSIS — I10 Essential (primary) hypertension: Secondary | ICD-10-CM

## 2015-06-14 DIAGNOSIS — K219 Gastro-esophageal reflux disease without esophagitis: Secondary | ICD-10-CM

## 2015-06-14 MED ORDER — HYDROCHLOROTHIAZIDE 25 MG OR TABS
ORAL_TABLET | ORAL | Status: DC
Start: 2015-06-14 — End: 2015-07-10

## 2015-06-14 MED ORDER — LISINOPRIL 40 MG OR TABS
ORAL_TABLET | ORAL | Status: DC
Start: 2015-06-14 — End: 2015-07-10

## 2015-06-14 MED ORDER — LORATADINE 10 MG OR TABS
ORAL_TABLET | ORAL | Status: DC
Start: 2015-06-14 — End: 2015-08-25

## 2015-06-14 MED ORDER — METFORMIN HCL 1000 MG OR TABS
ORAL_TABLET | ORAL | Status: DC
Start: 2015-06-14 — End: 2015-07-10

## 2015-06-14 MED ORDER — RANITIDINE HCL 150 MG OR TABS
ORAL_TABLET | ORAL | Status: DC
Start: 2015-06-14 — End: 2015-07-10

## 2015-07-05 ENCOUNTER — Encounter (HOSPITAL_BASED_OUTPATIENT_CLINIC_OR_DEPARTMENT_OTHER): Payer: No Typology Code available for payment source | Admitting: Internal Medicine

## 2015-07-10 ENCOUNTER — Telehealth (HOSPITAL_BASED_OUTPATIENT_CLINIC_OR_DEPARTMENT_OTHER): Payer: Self-pay | Admitting: Internal Medicine

## 2015-07-10 ENCOUNTER — Ambulatory Visit (HOSPITAL_BASED_OUTPATIENT_CLINIC_OR_DEPARTMENT_OTHER): Payer: No Typology Code available for payment source | Attending: Internal Medicine | Admitting: Internal Medicine

## 2015-07-10 VITALS — BP 124/48 | HR 88 | Temp 97.0°F | Resp 16 | Ht 64.0 in | Wt 178.0 lb

## 2015-07-10 DIAGNOSIS — F209 Schizophrenia, unspecified: Secondary | ICD-10-CM | POA: Insufficient documentation

## 2015-07-10 DIAGNOSIS — I10 Essential (primary) hypertension: Secondary | ICD-10-CM | POA: Insufficient documentation

## 2015-07-10 DIAGNOSIS — B353 Tinea pedis: Secondary | ICD-10-CM | POA: Insufficient documentation

## 2015-07-10 DIAGNOSIS — E118 Type 2 diabetes mellitus with unspecified complications: Secondary | ICD-10-CM | POA: Insufficient documentation

## 2015-07-10 DIAGNOSIS — K219 Gastro-esophageal reflux disease without esophagitis: Secondary | ICD-10-CM | POA: Insufficient documentation

## 2015-07-10 DIAGNOSIS — J45909 Unspecified asthma, uncomplicated: Secondary | ICD-10-CM | POA: Insufficient documentation

## 2015-07-10 MED ORDER — RANITIDINE HCL 150 MG OR TABS
150.0000 mg | ORAL_TABLET | Freq: Two times a day (BID) | ORAL | Status: DC
Start: 2015-07-10 — End: 2015-09-26

## 2015-07-10 MED ORDER — HYDROCHLOROTHIAZIDE 25 MG OR TABS
ORAL_TABLET | ORAL | Status: DC
Start: 2015-07-10 — End: 2015-09-26

## 2015-07-10 MED ORDER — CLOTRIMAZOLE 1 % EX CREA
1.0000 | TOPICAL_CREAM | Freq: Two times a day (BID) | CUTANEOUS | Status: DC
Start: 2015-07-10 — End: 2019-02-15

## 2015-07-10 MED ORDER — LISINOPRIL 40 MG OR TABS
ORAL_TABLET | ORAL | Status: DC
Start: 2015-07-10 — End: 2015-09-26

## 2015-07-10 MED ORDER — METFORMIN HCL 1000 MG OR TABS
1000.0000 mg | ORAL_TABLET | Freq: Two times a day (BID) | ORAL | Status: DC
Start: 2015-07-10 — End: 2015-09-26

## 2015-07-10 MED ORDER — ALBUTEROL SULFATE HFA 108 (90 BASE) MCG/ACT IN AERS
2.0000 | INHALATION_SPRAY | Freq: Four times a day (QID) | RESPIRATORY_TRACT | Status: DC | PRN
Start: 2015-07-10 — End: 2017-12-31

## 2015-07-10 NOTE — Progress Notes (Signed)
Reviewed dispensed meds and appears to be receiving divalproex.

## 2015-07-10 NOTE — Telephone Encounter (Signed)
(  TEXTING IS AN OPTION FOR [facility] CLINICS ONLY)  Is this a [place] clinic? No      RETURN CALL: Detailed message on voicemail only      SUBJECT:  General Message     REASON FOR REQUEST: returning call     MESSAGE: [name] called returning a phone call. Patient is on Divalproex sodium 250 MG Oral Tab EC delayed release 3 hs. Invega sustenna 234 MG suspensions extended release every four weeks injection/. No record of Risperidone. Thank you!

## 2015-07-10 NOTE — Patient Instructions (Addendum)
It was a pleasure seeing you in clinic today.    Thank you for bringing in your medications today; please do this every time!  We will recheck for diabetes today, but you can likely stop taking metformin if you want to.  We will ask the eye doctors to call you and schedule a clinic appointment.     General Info:  -Scheduling: Mobile desk [phone]  -Medication review: Please bring your current medication bottles or a list of your medications at each visit  -Medication refills: If you need refills, you or your pharmacy should contact us. It takes 3 work days to process refill requests. Our pharmacy phone number is [phone].  -Lab hours:  7:30 AM to 5:30 PM.

## 2015-07-10 NOTE — Progress Notes (Signed)
Adult Medicine Clinic - Outpatient Return Clinic Note    DATE: 07/10/2015     CHIEF COMPLAINT: Diabetes and Blood Pressure      INTERVAL HISTORY/HPI:    Justin Zavala is a 50 year old gentleman with developmental delay, schizophrenia, GERD, HTN, and diet controlled T2DM who is here today to follow-up on these issues.    Since the last clinic visit in 04/2015, he reports he has been well. He has no acute questions or concerns today.    Med Rec:  Brings in Fast-Pack with   -Lisinopril, HCTZ, metformin, ranitidine  Endorses risperidone and valproic acid at [facility]    Schizophrenia, Mental health:  He reports his mood is "ok" and reports good sleep, appetite and interest. He reports he wants to resume bowling, and states he needs to call his CM to help set this up. He reports he continues to see a provider at [facility]. He denies guilt, SI/HI, anxiety, or seeing/hearing things that others don't. He notes his siblings feel "guilty" regarding his mother's death a year ago 19-Jul-2014), but reports he does not feel guilty as he was able to spend time with her.   He reports good adherence with his valproic acid and risperidone, and can't explain why his valproic acid level was <3 at the last check.     Developmental Delay/Home safety:  Today Justin Zavala tells me he feels that things are going well at home. He reports he enjoys having a caregiver come for a few hours on Mondays, Thursdays, and Saturdays. He reports the caregiver helps with cleaning and cooking, but he wishes he/she could help with the laundry. However, he reports he enjoys doing many chores himself. He reports he very much values his independent living situation, and would not want to move into an adult family home.  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 He enjoys spending time with his fiance and her children (54, 34, and 8) who will be moving in with him and his roommate.    T2DM: Endorses good adherence with metformin and likes reading about  diabetes. Denies adverse effects. Denies vision changes, peripheral anesthesia/paresthesia, changes in urine. Despite an A1C of 5.4, he reports he "feels better" while taking metformin.    GERD:  Denies acid reflux or heartburn on ranitidine.    Asthma: Denies wheezing or shortness of breath, and uses his albuterol every other day.    HTN:  Denies HA, CP, palpitations, dizziness.     Social History     Social History Narrative    Has fairly significant developmental delay.     Lives with roommates.  Has caregivers that come almost daily to help with cleaning, cooking, and other chores. They set up his medications every week.     States that he has a "fiance" that is his girlfriend. They are not sexually active and do not plan to be. She has a child which he says he has been helping care for.     On SSI and has a case Freight forwarder that helps him (patrick).         PHYSICAL EXAM:  BP 124/48 mmHg  Pulse 88  Temp(Src) 97 F (36.1 C) (Temporal)  Resp 16  Ht 5\' 4"  (1.626 m)  Wt 178 lb (80.74 kg)  BMI 30.54 kg/m2    GEN: Pleasant middle-aged man, resting comfortably in chair, in NAD. Wearing puffy jacket and carries two bags of magazines and papers.  HEENT: Head atraumatic and normocephalic. Sclera anicteric, conjunctivae without injections. Mucus membranes moist, missing several teeth.  RESP: Normal respiratory effort.   CV: Extremities warm.  SKIN: No rashes or other abnormalities noted on exposed skin.  NEURO: Alert and oriented x 4. Gait normal.  PSYCHIATRIC:      Appearance: Appears stated age; attire, grooming, eye contact wnl    Behavior: Laughing frequently, somewhat distracted at times and perseverates on diabetes, watching TV. Cooperative, no psychomotor agitation/retardation     Mood/affect:  "good"/ Euthymic, full/normal range    Judgement/insight:  Fair/partial    Cognitive function:  Alert and oriented, memory fair    Recent/remote memory: fair    Speech: Mildly rapid. Rhythm, intonatation, volume, and  amount wnl.     Thought process: Somewhat circumstantial    Though Content: no preoccupations or delusions, perseverates on diabetes, watching TV, not feeling guilty about loss of his mother     Motor: wnl    LAB RESULTS:  Office Visit on 04/21/2015   Component Date Value   . Sodium 04/21/2015 135    . Potassium 04/21/2015 3.8    . Chloride 04/21/2015 100    . Carbon Dioxide, Total 04/21/2015 29    . Anion Gap 04/21/2015 6    . Glucose 04/21/2015 111    . Urea Nitrogen 04/21/2015 11    . Creatinine 04/21/2015 0.95    . Protein (Total) 04/21/2015 7.5    . Albumin 04/21/2015 4.9    . Bilirubin (Total) 04/21/2015 1.6*   . Calcium 04/21/2015 10.0    . AST (GOT) 04/21/2015 26    . Alkaline Phosphatase (To* 04/21/2015 100    . ALT (GPT) 04/21/2015 28    . GFR, Calc, European Amer* 04/21/2015 >60    . GFR, Calc, African Ameri* 04/21/2015 >60    . WBC 04/21/2015 4.34    . RBC 04/21/2015 4.72    . Hemoglobin 04/21/2015 14.0    . Hematocrit 04/21/2015 41    . MCV 04/21/2015 86    . [facility] 04/21/2015 29.7    . MCHC 04/21/2015 34.5    . Platelet Count 04/21/2015 194    . RDW-CV 04/21/2015 13.1    . Valproic Acid, Free (Sen* 04/21/2015 <3*   . Valproic Acid, Total (Se* 04/21/2015 <3*     ASSESSMENT AND PLAN:    Justin Justin Zavala is a 50 year old gentleman with developmental delay, schizophrenia, GERD, HTN, and diet controlled T2DM who is here today to follow-up on these issues.    # Mental Health:   Appears stable on exam/interview today, though prior CM [name] with concerns about hygiene, hoarding, social interaction, particularly in light of his mother's death this past year 07/29/14). Seen at [facility] for schizophrenia per chart review and valproate and risperidone though with most recent valproate level of <3.  Sees CM twice a week (previously [name], now [place]), psychiatrist every 6 months, and attends daily "groups." Currently also working with a DDA CM. While his insight and judgement is difficult to assess given  concomitant developmental delay, he does not appear to be responding to internal stimuli. Denies any episodes of mania, decreased sleep, risky behaviors, illicit drug use, or sexual activity.  CMP and CBC wnl 04/2015  Tried to call [facility] to follow-up medication regimen given valproate level of <3, left a message regarding my question.  - Will continue to try to call [facility] and CM to clarify medication regimen    # Developmental Delay/Home  safety:  Appears that things are going well at home with caregiver help. Living with roommates and girlfriend will be moving in shortly, continues to work at [employer]. Per chart review and continued discussion today, living independently is consistent with his GOC and that of his family, though he states I may call his sister (who lives in the same apartment building) and his DDA CM to further discuss. Based on history and appearance today, I have no concerns, but will try to follow-up with his CM and family.  - Follow-up with DDA CM and patient's sister    # T2DM: A1C 5.4 in 11/2014 on metformin 1000mg  BID and without microalbuminuria (9 in 12/2013), though monofilament testing difficult due to developmental delay and no recent Opthalmology evaluations here. Have discussed discontinuing metformin versus continuing for cardiovascular benefits, and he prefers to continue his metformin.   - Continue metformin 1000mg  BID  - Ophthalmology referral  (unable to keep eyes open for fundal imaging)    # Asthma: Appears to be well controlled with albuterol PRN.  - Continue albuterol PRN.    # HTN: Relatively normotensive on lisinopril 40 mg and HCTZ 25 mg (amlodipine stopped d/t LE edema).  No microalbuminuria. Asked him to bring all meds to clinic each visit.   - Continue lisinopril 40mg  daily and HCTZ 25 mg daily    # GERD:   Discussed stopping ranitidine given well controled symptoms, though he prefers to continue.  - Continue ranitidine 150mg  BID     # HCM:   - A1C  -Ophthalmology referral (unable  to keep eyes open for fundal imaging)      RTC: 3 months   Discussed with: Dr. Cassandria Santee, MD  Internal Medicine, PGY3        PROBLEM LIST:  Patient Active Problem List    Diagnosis Date Noted   . Schizophrenia ([place]) 09/13/2013     Per chart review  Seen at [facility].     . GERD (gastroesophageal reflux disease) 09/13/2013   . Hypertension 07/08/2013   . Health care maintenance 04/22/2011     CV:  Not on ASA  Lipids: Not on a statin   10 year ASCVD 9% (given no longer diabetic)   CHOLESTEROL      179   03/16/2014   HDL       35   03/16/2014   LDL      130   03/16/2014   TRIGLYCERIDE       70   03/16/2014  Glucose:    A1C      5.4   11/07/2014    No components found with this name: cre   ALBCREATRATU       11   11/07/2014   Eye Exam:   Done at OSH(?)   Foot exam:  Equivocal as pt always states he feels monofilament (even when not touching)      On statin: No  AAA:    N/A, never smoker  CA:    Colon: Needs when 52  ID:    HIV: neg 2014   Hepatitis: (Hep C if born btn [place])       HAV:       HBV:       HCV: nonreactive 02/12/13   TB:   Tdap:  2011     Pneumonia: PPSV23 2003   Flu: 01/2015   Zoster:  Needs when 62  Bone:  N/A  Lifestyle:  EtOH: Denies   Tobacco: Denies   Illicits: Denies   Sexual health: Denies   Exercise:  EOL:        . Extrinsic asthma 01/04/2011   . 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 Should be called after appointments to update him on any changes  Mother [name] can be reached at [phone]   [name] can be reached at work number [phone]     . Type 2 diabetes mellitus with complication ([place]) 123456     A1c: 5.4% 01/2015  Current Meds:  Metformin   Lipids:  See health care maintenance  Albuminuria: 9 (12/2013)   Retinopathy: seen by optho 04/2011- [facility]- outside provider.  Neuropathy/Footexam: Inconsistent reports by patient, endorses "feeling it" when monofilament not touching skin.        MEDICATIONS:  Current Outpatient Prescriptions   Medication Sig Dispense Refill   . Albuterol Sulfate HFA 108 (90 Base) MCG/ACT Inhalation Aero Soln Inhale 2 puffs by mouth every 6 hours as needed. For shortness of breath. 1 Inhaler 6   . Clotrimazole 1 % External Cream Apply 1 application topically 2 times a day. Apply to feet for fungal rash. 45 g 3   . Divalproex Sodium 500 MG Oral Tab EC take 1.5 pills qhs, FROM [facility]     . HydroCHLOROthiazide 25 MG Oral Tab TAKE 1 TABLET BY MOUTH DAILY 28 tablet 2   . Lisinopril 40 MG Oral Tab TAKE 1 TABLET BY MOUTH DAILY (40 MG) 28 tablet 2   . MetFORMIN HCl 1000 MG Oral Tab Take 1 tablet (1,000 mg) by mouth 2 times a day. 56 tablet 2   . RaNITidine HCl 150 MG Oral Tab Take 1 tablet (150 mg) by mouth 2 times a day. 56 tablet 2   . RISPERIDONE OR 50mg  injection--by [facility]       No current facility-administered medications for this visit.     ALLERGIES:  Review of patient's allergies indicates:  No Known Allergies

## 2015-07-11 LAB — HEMOGLOBIN A1C, HPLC: Hemoglobin A1C: 5.5 % (ref 4.0–6.0)

## 2015-07-11 NOTE — Telephone Encounter (Signed)
TE for PCP .  Or Pharm

## 2015-07-11 NOTE — Telephone Encounter (Signed)
Updated med list in EPIC and forwarding to Dr. Charma Igo.

## 2015-07-11 NOTE — Telephone Encounter (Signed)
Fwd return call from a [name]

## 2015-07-12 LAB — VALPROIC ACID, FREE AND TOTAL
Valproic Acid, Free (Sendout): <3 ug/mL — ABNORMAL LOW (ref 5–25)
Valproic Acid, Total (Sendout): 35 ug/mL — ABNORMAL LOW (ref 50–125)

## 2015-07-14 NOTE — Progress Notes (Signed)
ATTENDING NOTE  I have personally discussed the case with Sarah A McGuffin, MD during or immediately after the patient visit including review of history, physical exam, diagnosis, and treatment plan. I agree with the assessment and plan of care.     Nicholus Dashon Mcintire, MD

## 2015-08-04 ENCOUNTER — Ambulatory Visit (HOSPITAL_BASED_OUTPATIENT_CLINIC_OR_DEPARTMENT_OTHER): Payer: No Typology Code available for payment source

## 2015-08-08 ENCOUNTER — Other Ambulatory Visit: Payer: Self-pay | Admitting: Internal Medicine

## 2015-08-08 DIAGNOSIS — I10 Essential (primary) hypertension: Secondary | ICD-10-CM

## 2015-08-09 NOTE — Telephone Encounter (Signed)
Refill request for Metoprolol requires your authorization because it was discontinued off medication list on 07/10/15 but patient is now requesting a refill.    Reason for discontinuation: availability    1 month supply last filled 07/13/15    If medication is DISCONTINUED please "refuse all" medication requests and have your staff contact the patient.  If medication is to be continued please add refills, sign order and close encounter.

## 2015-08-14 ENCOUNTER — Other Ambulatory Visit (HOSPITAL_BASED_OUTPATIENT_CLINIC_OR_DEPARTMENT_OTHER): Payer: Self-pay | Admitting: Internal Medicine

## 2015-08-14 MED ORDER — METOPROLOL SUCCINATE ER 25 MG OR TB24
EXTENDED_RELEASE_TABLET | ORAL | Status: DC
Start: 2015-08-14 — End: 2016-01-16

## 2015-08-25 ENCOUNTER — Other Ambulatory Visit (HOSPITAL_BASED_OUTPATIENT_CLINIC_OR_DEPARTMENT_OTHER): Payer: Self-pay | Admitting: Internal Medicine

## 2015-08-25 DIAGNOSIS — J309 Allergic rhinitis, unspecified: Secondary | ICD-10-CM

## 2015-08-25 MED ORDER — LORATADINE 10 MG OR TABS
ORAL_TABLET | ORAL | Status: DC
Start: 2015-08-25 — End: 2016-01-16

## 2015-08-28 ENCOUNTER — Ambulatory Visit (HOSPITAL_BASED_OUTPATIENT_CLINIC_OR_DEPARTMENT_OTHER): Payer: No Typology Code available for payment source

## 2015-09-20 ENCOUNTER — Encounter (HOSPITAL_BASED_OUTPATIENT_CLINIC_OR_DEPARTMENT_OTHER): Payer: No Typology Code available for payment source | Admitting: Internal Medicine

## 2015-09-26 ENCOUNTER — Other Ambulatory Visit (HOSPITAL_BASED_OUTPATIENT_CLINIC_OR_DEPARTMENT_OTHER): Payer: Self-pay | Admitting: Internal Medicine

## 2015-09-26 DIAGNOSIS — K219 Gastro-esophageal reflux disease without esophagitis: Secondary | ICD-10-CM

## 2015-09-26 DIAGNOSIS — I10 Essential (primary) hypertension: Secondary | ICD-10-CM

## 2015-09-26 DIAGNOSIS — E118 Type 2 diabetes mellitus with unspecified complications: Secondary | ICD-10-CM

## 2015-09-27 MED ORDER — RANITIDINE HCL 150 MG OR TABS
150.0000 mg | ORAL_TABLET | Freq: Two times a day (BID) | ORAL | 1 refills | Status: DC
Start: 2015-09-27 — End: 2015-11-21

## 2015-09-27 MED ORDER — LISINOPRIL 40 MG OR TABS
40.0000 mg | ORAL_TABLET | Freq: Every day | ORAL | 1 refills | Status: DC
Start: 2015-09-27 — End: 2015-11-21

## 2015-09-27 MED ORDER — METFORMIN HCL 500 MG OR TABS
1000.0000 mg | ORAL_TABLET | Freq: Two times a day (BID) | ORAL | 1 refills | Status: DC
Start: 2015-09-27 — End: 2015-11-21

## 2015-09-27 MED ORDER — HYDROCHLOROTHIAZIDE 25 MG OR TABS
25.0000 mg | ORAL_TABLET | Freq: Every day | ORAL | 1 refills | Status: DC
Start: 2015-09-27 — End: 2015-11-21

## 2015-10-16 ENCOUNTER — Ambulatory Visit (HOSPITAL_BASED_OUTPATIENT_CLINIC_OR_DEPARTMENT_OTHER): Payer: No Typology Code available for payment source

## 2015-11-03 ENCOUNTER — Ambulatory Visit (HOSPITAL_BASED_OUTPATIENT_CLINIC_OR_DEPARTMENT_OTHER)
Payer: No Typology Code available for payment source | Attending: Internal Medicine | Admitting: Unknown Physician Specialty

## 2015-11-03 ENCOUNTER — Encounter (HOSPITAL_BASED_OUTPATIENT_CLINIC_OR_DEPARTMENT_OTHER): Payer: Self-pay | Admitting: Unknown Physician Specialty

## 2015-11-03 VITALS — BP 134/58 | HR 83 | Temp 96.1°F | Resp 16 | Ht 64.0 in | Wt 180.0 lb

## 2015-11-03 DIAGNOSIS — I10 Essential (primary) hypertension: Secondary | ICD-10-CM

## 2015-11-03 DIAGNOSIS — J45909 Unspecified asthma, uncomplicated: Secondary | ICD-10-CM

## 2015-11-03 DIAGNOSIS — F209 Schizophrenia, unspecified: Secondary | ICD-10-CM

## 2015-11-03 DIAGNOSIS — E785 Hyperlipidemia, unspecified: Secondary | ICD-10-CM

## 2015-11-03 DIAGNOSIS — E118 Type 2 diabetes mellitus with unspecified complications: Secondary | ICD-10-CM | POA: Insufficient documentation

## 2015-11-03 LAB — ALBUMIN/CREATININE RATIO, RANDOM URINE
Albumin (Micro), URN: 1.27 mg/dL
Albumin/Creatinine Ratio, URN: 5 mg/g{creat} (ref <30)
Creatinine/Unit, URN: 243 mg/dL

## 2015-11-03 LAB — LIPID PANEL
Cholesterol (LDL): 143 mg/dL — ABNORMAL HIGH (ref <130)
Cholesterol/HDL Ratio: 5.3
HDL Cholesterol: 37 mg/dL — ABNORMAL LOW (ref >39)
Non-HDL Cholesterol: 160 mg/dL — ABNORMAL HIGH (ref 0–159)
Total Cholesterol: 197 mg/dL (ref <200)
Triglyceride: 84 mg/dL (ref <150)

## 2015-11-03 MED ORDER — PRAVASTATIN SODIUM 40 MG OR TABS
40.0000 mg | ORAL_TABLET | Freq: Every evening | ORAL | 6 refills | Status: DC
Start: 2015-11-03 — End: 2016-05-14

## 2015-11-03 NOTE — Patient Instructions (Addendum)
1. Try to get as much exercise as you can!  Try to walk everyday   2. Go downstairs for labs today to check on diabetes  3. We will resend the eye doctor referral--they will call you to schedule.  Please call us if you do not hear from them!  4. Start new medicine (statin) for cholesterol--you can pick up from pharmacy next time you go to pick up medications     Please bring ALL of your medications to your next appointment.     Follow up in clinic in 3 months

## 2015-11-03 NOTE — Progress Notes (Signed)
Adult Medicine Clinic - Outpatient Initial Clinic Note    DATE: 11/03/2015     Justin Zavala is a 50 year old male who is here today for establish care. An interpreter was not needed for the visit.    CHIEF COMPLAINT:  Establish Care    INTERVAL HISTORY/HPI:  Justin Zavala is a 50 year old male with developmental delay, schizophrenia, GERD, HTN, and T2DM here to establish care.  He presents today with one of his care givers, Justin Zavala.  His prior PCP was Dr. Charma Zavala, last saw her in clinic 07/10/15.  Since that time, he has done well.  No major illnesses, no hospitalizations.     T2DM: A1c checked at last appointment, 5.5%.  Patient and provider elected to continue metformin, 1000 BID at that time due to patient preference and for ongoing risk reduction.  Patient reports he is doing well on this. He was referred to optho at last appt, but was not able to set up the appointment.  Patient's weight has been stable, he states he has been walking.  Scheduled to meet with nutrition next week.     Mental Health, Schizophrenia: States his mood is good over.  He is excited to go bowling this summer.  Reports he has a case Justin Zavala, Justin Zavala, via [facility].  Saw Justin Zavala yesterday and generally sees him weekly.  [facility] manages his psych medications, Invega and divalproex which he says he is taking reliably.  He reports that he likes watching TV and listening to radio.  No guilty thoughts. Does state he feels sad occasionally due to his mother's death last year.  He is Sleeping well, eating well.  No SI/HI or thoughts of harming self or others.      Developmental Delay:  Justin Zavala has history of dev delay.  He lives in [place] in an apartment, but has caregivers who come for several hours most days of the week.  They help with chores; his other caregiver helps him manage his medications.  Patient states he has a girlfriend he enjoys seeing; she has a daughter who he will spend time with as well.  Patient was working at [employer] until  recently but quit about 2 weeks ago.  Working with case worker to find another job.     Patient did not bring medications in to appointment today.     Asthma: uses albuterol 2/week .  Denies nighttime symptoms, cough.  Does state he gets winded with lots of walking sometimes.      GERD: on ranitidine, no sx     PAST MEDICAL HISTORY:  Patient Active Problem List   Diagnosis   . Type 2 diabetes mellitus with complication ([place])   . 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 Extrinsic asthma   . Health care maintenance   . Hypertension   . Schizophrenia ([place])   . GERD (gastroesophageal reflux disease)       MEDICATIONS:  Current Outpatient Prescriptions   Medication Sig Dispense Refill   . Albuterol Sulfate HFA 108 (90 Base) MCG/ACT Inhalation Aero Soln Inhale 2 puffs by mouth every 6 hours as needed. For shortness of breath. 1 Inhaler 6   . Clotrimazole 1 % External Cream Apply 1 application topically 2 times a day. Apply to feet for fungal rash. 45 g 3   . Divalproex Sodium 250 MG Oral Tab EC Take 750 mg by mouth at bedtime.     [name] HydroCHLOROthiazide 25 MG Oral Tab Take 1  tablet (25 mg) by mouth daily. 28 tablet 1   . Lisinopril 40 MG Oral Tab Take 1 tablet (40 mg) by mouth daily. 28 tablet 1   . Loratadine 10 MG Oral Tab TAKE 1 TABLET BY MOUTH DAILY 28 tablet 11   . MetFORMIN HCl 500 MG Oral Tab Take 2 tablets (1,000 mg) by mouth 2 times a day. 112 tablet 1   . Metoprolol Succinate ER 25 MG Oral TABLET SR 24 HR TAKE 1/2 TABLET (12.5MG ) BY MOUTH DAILY - DO NOT CHEW OR CRUSH 14 tablet 5   . Paliperidone Palmitate 234 MG/1.5ML Intramuscular Suspension Inject 234 mg intramuscularly every 4 weeks.     . Pravastatin Sodium 40 MG Oral Tab Take 1 tablet (40 mg) by mouth every evening. 30 tablet 6   . RaNITidine HCl 150 MG Oral Tab Take 1 tablet (150 mg) by mouth 2 times a day. 56 tablet 1     No current facility-administered medications for this visit.      ALLERGIES:  Review of patient's allergies indicates:  No Known  Allergies     RISK FACTORS:  Substance abuse: No  Tobacco history: Justin Zavala reports that he has never smoked. He has never used smokeless tobacco.  Comments: Denies alcohol or drug use     FAMILY MEDICAL HISTORY:  Justin Zavala's family history includes Cancer in his mother; Diabetes in his mother; Mental Illness in his sister; Stroke in his mother.    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  At least one sister lives locally and is very supportive of Justin Zavala.     REVIEW OF SYSTEMS:  A full ROS was completed with pertinent positives and negatives noted per HPI     PHYSICAL EXAM:  BP 134/58   Pulse 83   Temp 96.1 F (35.6 C) (Temporal)   Resp 16   Ht 5\' 4"  (1.626 m)   Wt 180 lb (81.6 kg)   BMI 30.90 kg/m   General: middle aged male, sitting comfortably in chair in no acute distress  HEENT: unable to complete eye exam as patient will not keep eyes open for extended exam.  Sclera anicteric, PERRL, MMM.  Poor dentition with many missing teeth   Neck: Supple   Cv: RRR, no murmur or gallop appreciated  Pulm: CTAB, no wheezing   Gi: obese, nontender, +BS  Skin: no rashes on exposed skin, warm and dry   Extremities: No LE edema, symmetric   Feet: no sores or wounds   Neuro: normal gait, grossly normal strength and tone, CN grossly intact. No focal neuro deficits  Psych: pleasant, interactive.  Mood: good, Affect: full.  Responding appropriately to questions    LAB RESULTS:  No new labs; prior results reviewed:    Lab Results   Component Value Date    A1C 5.5 07/10/2015     Results for orders placed or performed in visit on 04/21/15   CBC (HEMOGRAM)   Result Value Ref Range    WBC 4.34 4.30 - 10.00 10*3/uL    RBC 4.72 4.40 - 5.60 10*6/uL    Hemoglobin 14.0 13.0 - 18.0 g/dL    Hematocrit 41 38 - 50 %    MCV 86 81 - 98 fL    MCH 29.7 27.3 -  33.6 pg    MCHC 34.5 32.2 - 36.5 g/dL    Platelet Count 194 150 - 400 10*3/uL    RDW-CV 13.1 11.6 - 14.4 %     Results for orders placed or performed in visit on 04/21/15   COMPREHENSIVE METABOLIC PANEL   Result Value Ref Range    Sodium 135 135 - 145 meq/L    Potassium 3.8 3.6 - 5.2 meq/L    Chloride 100 98 - 108 meq/L    Carbon Dioxide, Total 29 22 - 32 meq/L    Anion Gap 6 4 - 12    Glucose 111 62 - 125 mg/dL    Urea Nitrogen 11 8 - 21 mg/dL    Creatinine 0.95 0.51 - 1.18 mg/dL    Protein (Total) 7.5 6.0 - 8.2 g/dL    Albumin 4.9 3.5 - 5.2 g/dL    Bilirubin (Total) 1.6 (H) 0.2 - 1.3 mg/dL    Calcium 10.0 8.9 - 10.2 mg/dL    AST (GOT) 26 9 - 38 U/L    Alkaline Phosphatase (Total) 100 39 - 139 U/L    ALT (GPT) 28 10 - 64 U/L    GFR, Calc, European American >60 mL/min/[1.73_m2]    GFR, Calc, African American >60 mL/min/[1.73_m2]    GFR, Information       Calculated GFR in mL/min/1.73 m2 by MDRD equation.  Inaccurate with changing renal function.  See [email or web address]://[email or web address]     Cholesterol (HDL)   Date Value Ref Range Status   03/16/2014 35 (L) >39 mg/dL Final   08/21/2011 30 (L) >40 mg/dL      Cholesterol (LDL)   Date Value Ref Range Status   03/16/2014 130 (H) <130 mg/dL Final   08/21/2011 79 <130 mg/dL      Cholesterol (Total)   Date Value Ref Range Status   03/16/2014 179 <200 mg/dL Final   08/21/2011 124 <200 mg/dL      Cholesterol/HDL Ratio   Date Value Ref Range Status   03/16/2014 5.1  Final   08/21/2011 4.1       Triglyceride   Date Value Ref Range Status   03/16/2014 70 <150 mg/dL Final   08/21/2011 74 <150 mg/dL        ASSESSMENT AND PLAN:  Justin Zavala is a 50 year old gentleman with developmental delay, schizophrenia, GERD, HTN, asthma, and diet controlled T2DM who is here today to follow-up on these issues.    # Mental Health:   Patient reports good mood, denies SI/HI and reports medication compliance, no evidence of responding to internal stimuli and denies such stimuli  today.  Has case worker Justin Zavala) and seen at [facility] for schizophrenia.  At last appointment with Dr. Charma Zavala, medication regimen confirmed to be Paliperidone Injection monthly and divalproex 750 QHS.    -Patient will continue to receive medications, monitoring through [facility]  -Continue to monitor   -Asked patient to please bring all medications to next appointment     # Developmental Delay/Home safety:  Appears that things are going well at home with caregiver help; recently quit job however (but reportedly working to find new job). Per chart review and continued discussion today, living independently is consistent with his GOC and that of his family  -Follow up with family and care givers as needed to ensure patient still doing well, no significant concerns to address at this time     # T2DM: A1C 5.5 07/2015 on metformin 1000 mg BID.  No microalbuminuria (11 in 11/2014).  Has not seen optho recently (referred at last appointment but was not able to schedule).  CMP wnl in Jan 2017.   -Monofilament at next appointment (although reportedly has been difficult in assess in past due to developmental delay as patient states he always feels monofilament even when not there).  - Continue metformin 1000mg  BID  - Re sent Ophthalmology referral now  - Recheck for microalbuminuria today   -Recheck lipid panel today as below  -nutrition appointment scheduled for Aug 1, appreciate recommendations     # Hyperlipidemia: patient with elevated lipids at prior check in 2015 (10 yr ASCVD 9% at that time, LDL 130).  Statin was deferred at that point.  However, given risk factors of T2DM, obesity, HTN will plan on checking lipid level now and initiating statin at this point.   -Lipid panel now  -Start Pravastatin 40 mg Daily     # Asthma: Appears to be well controlled with albuterol PRN.  - Continue albuterol PRN.    # HTN: Relatively normotensive on lisinopril 40 mg and HCTZ 25 mg  (amlodipine stopped previously d/t LE  edema).  Patient received refill for metoprolol 12.5 mg daily this year as well (he is unsure if taking currently though). No microalbuminuria at last check. Asked him to bring all meds to clinic each visit.   - Continue lisinopril 40mg  daily and HCTZ 25 mg daily  - Continue metoprolol as refilled; will verify with patient on follow up. Instructed patient to bring all medications to clinic at next appointment.      # GERD: no active symptoms currently   - Continue ranitidine 150mg  BID        RTC: 3 months or sooner with any concern    Excell Seltzer MD  Internal Medicine R1

## 2015-11-06 ENCOUNTER — Encounter (HOSPITAL_BASED_OUTPATIENT_CLINIC_OR_DEPARTMENT_OTHER): Payer: Self-pay | Admitting: Unknown Physician Specialty

## 2015-11-06 NOTE — Progress Notes (Signed)
Attending Statement     I saw the patient with Dr. Bobbye Charleston.  We discussed this patient's history, physical findings, diagnoses and plan; and I reviewed and confirm the findings as documented in her  note.     Devaris P. Luisantonio Adinolfi MD FACP

## 2015-11-07 ENCOUNTER — Ambulatory Visit (HOSPITAL_BASED_OUTPATIENT_CLINIC_OR_DEPARTMENT_OTHER): Payer: No Typology Code available for payment source | Admitting: Internal Medicine

## 2015-11-10 ENCOUNTER — Ambulatory Visit (HOSPITAL_BASED_OUTPATIENT_CLINIC_OR_DEPARTMENT_OTHER): Payer: No Typology Code available for payment source

## 2015-11-21 ENCOUNTER — Other Ambulatory Visit (HOSPITAL_BASED_OUTPATIENT_CLINIC_OR_DEPARTMENT_OTHER): Payer: Self-pay | Admitting: Internal Medicine

## 2015-11-21 DIAGNOSIS — K219 Gastro-esophageal reflux disease without esophagitis: Secondary | ICD-10-CM

## 2015-11-21 DIAGNOSIS — I10 Essential (primary) hypertension: Secondary | ICD-10-CM

## 2015-11-21 DIAGNOSIS — E118 Type 2 diabetes mellitus with unspecified complications: Secondary | ICD-10-CM

## 2015-11-22 MED ORDER — HYDROCHLOROTHIAZIDE 25 MG OR TABS
ORAL_TABLET | ORAL | 4 refills | Status: DC
Start: 2015-11-22 — End: 2016-04-09

## 2015-11-22 MED ORDER — METFORMIN HCL 500 MG OR TABS
ORAL_TABLET | ORAL | 4 refills | Status: DC
Start: 2015-11-22 — End: 2016-04-09

## 2015-11-22 MED ORDER — RANITIDINE HCL 150 MG OR TABS
ORAL_TABLET | ORAL | 4 refills | Status: DC
Start: 2015-11-22 — End: 2016-04-09

## 2015-11-22 MED ORDER — LISINOPRIL 40 MG OR TABS
ORAL_TABLET | ORAL | 4 refills | Status: DC
Start: 2015-11-22 — End: 2016-04-09

## 2015-12-05 ENCOUNTER — Telehealth (HOSPITAL_BASED_OUTPATIENT_CLINIC_OR_DEPARTMENT_OTHER): Payer: Self-pay | Admitting: Unknown Physician Specialty

## 2015-12-05 NOTE — Telephone Encounter (Signed)
appt

## 2015-12-05 NOTE — Telephone Encounter (Signed)
(  TEXTING IS AN OPTION FOR [facility] ONLY)  Is this a [place] clinic? 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 Provider's schedule appears full. Please call back patient's case manager to discuss, thank you!

## 2015-12-21 NOTE — Telephone Encounter (Signed)
Tried to call pt but number is not in service   DK - 12/21/15

## 2016-01-16 ENCOUNTER — Other Ambulatory Visit (HOSPITAL_BASED_OUTPATIENT_CLINIC_OR_DEPARTMENT_OTHER): Payer: Self-pay | Admitting: Unknown Physician Specialty

## 2016-01-16 DIAGNOSIS — I10 Essential (primary) hypertension: Secondary | ICD-10-CM

## 2016-01-16 DIAGNOSIS — J309 Allergic rhinitis, unspecified: Secondary | ICD-10-CM

## 2016-01-16 MED ORDER — LORATADINE 10 MG OR TABS
10.0000 mg | ORAL_TABLET | Freq: Every day | ORAL | 0 refills | Status: DC
Start: 2016-01-16 — End: 2016-03-12

## 2016-01-16 MED ORDER — METOPROLOL SUCCINATE ER 25 MG OR TB24
12.5000 mg | EXTENDED_RELEASE_TABLET | Freq: Every day | ORAL | 0 refills | Status: DC
Start: 2016-01-16 — End: 2016-02-14

## 2016-01-23 ENCOUNTER — Ambulatory Visit (HOSPITAL_BASED_OUTPATIENT_CLINIC_OR_DEPARTMENT_OTHER): Payer: No Typology Code available for payment source | Admitting: Unknown Physician Specialty

## 2016-01-25 ENCOUNTER — Other Ambulatory Visit (HOSPITAL_BASED_OUTPATIENT_CLINIC_OR_DEPARTMENT_OTHER): Payer: Self-pay | Admitting: Internal Medicine

## 2016-01-25 ENCOUNTER — Telehealth (HOSPITAL_BASED_OUTPATIENT_CLINIC_OR_DEPARTMENT_OTHER): Payer: Self-pay | Admitting: Nursing

## 2016-01-25 DIAGNOSIS — E118 Type 2 diabetes mellitus with unspecified complications: Secondary | ICD-10-CM

## 2016-01-25 NOTE — Telephone Encounter (Signed)
Order placed by Dr Willette Pa AOD.  I called [name] and left message regarding this.

## 2016-01-25 NOTE — Telephone Encounter (Signed)
-----   Message from [place], [place] sent at 01/24/2016  2:12 PM PDT -----  Hi Nurses,  Pt's case manager [name] called trying to see if pt can come in for an A1C. Pt is having a tooth Extraction on Friday and they will not be able to do the procedure until the pt has the A1C done. He is asking that a nurse pls call him back today ot tomorrow morning [phone].

## 2016-02-06 ENCOUNTER — Encounter (HOSPITAL_BASED_OUTPATIENT_CLINIC_OR_DEPARTMENT_OTHER): Payer: Self-pay | Admitting: Nurse Practitioner

## 2016-02-06 ENCOUNTER — Ambulatory Visit (HOSPITAL_BASED_OUTPATIENT_CLINIC_OR_DEPARTMENT_OTHER): Payer: No Typology Code available for payment source | Attending: Nurse Practitioner | Admitting: Nurse Practitioner

## 2016-02-06 VITALS — BP 157/65 | HR 91 | Temp 97.7°F | Ht 64.0 in | Wt 191.0 lb

## 2016-02-06 DIAGNOSIS — Z6832 Body mass index (BMI) 32.0-32.9, adult: Secondary | ICD-10-CM

## 2016-02-06 DIAGNOSIS — E0800 Diabetes mellitus due to underlying condition with hyperosmolarity without nonketotic hyperglycemic-hyperosmolar coma (NKHHC): Secondary | ICD-10-CM | POA: Insufficient documentation

## 2016-02-06 DIAGNOSIS — Z23 Encounter for immunization: Secondary | ICD-10-CM | POA: Insufficient documentation

## 2016-02-06 DIAGNOSIS — Z0000 Encounter for general adult medical examination without abnormal findings: Secondary | ICD-10-CM | POA: Insufficient documentation

## 2016-02-06 DIAGNOSIS — Z008 Encounter for other general examination: Secondary | ICD-10-CM

## 2016-02-06 NOTE — Progress Notes (Signed)
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    PROBLEM LIST:  Patient Active Problem List    Diagnosis Date Noted   . Schizophrenia ([place]) 09/13/2013     Per chart review  Seen at [facility], [place].     . GERD (gastroesophageal reflux disease) 09/13/2013   . Hypertension 07/08/2013   . Health care maintenance 04/22/2011     CV:  Not on ASA  Lipids: Not on a statin   10 year ASCVD 9% (given no longer diabetic)   CHOLESTEROL      179   03/16/2014   HDL       35   03/16/2014   LDL      130   03/16/2014   TRIGLYCERIDE       70   03/16/2014  Glucose:    A1C      5.4   11/07/2014    No components found with this name: cre   ALBCREATRATU       11   11/07/2014   Eye Exam:   Done at OSH(?)   Foot exam:  Equivocal as pt always states he feels monofilament (even when not touching)      On statin: No  AAA:    N/A, never smoker  CA:    Colon: Needs when 52  ID:    HIV: neg 2014   Hepatitis: (Hep C if born btn [place])       HAV:       HBV:       HCV: nonreactive 02/12/13   TB:   Tdap:  2011     Pneumonia: PPSV23 2003   Flu: 01/2015   Zoster:  Needs when 62  Bone:  N/A  Lifestyle:   EtOH: Denies   Tobacco: Denies   Illicits: Denies   Sexual health: Denies   Exercise:  EOL:        . Extrinsic asthma 01/04/2011   . 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 Should be called after appointments to update him on any changes  Mother Justin Zavala can be reached at [phone]   Justin Zavala can be reached at work number [phone]  Has a caregiver a few times a week for cleaning, cooking, bathing assistance.     . Type 2 diabetes mellitus with complication ([place]) 123456     A1c: 5.4% 01/2015  Current Meds:  Metformin   Lipids:  See health care maintenance  Albuminuria: 9  (12/2013)   Retinopathy: seen by optho 04/2011- [facility]- outside provider.  Neuropathy/Footexam: Inconsistent reports by patient, endorses "feeling it" when monofilament not touching skin.       MEDICATIONS:  Current Outpatient Prescriptions   Medication Sig Dispense Refill   . Albuterol Sulfate HFA 108 (90 Base) MCG/ACT Inhalation Aero Soln Inhale 2 puffs by mouth every 6 hours as needed. For shortness of breath. 1 Inhaler 6   . Clotrimazole 1 % External Cream Apply 1 application topically 2 times a day. Apply to feet for fungal rash. 45 g 3   . Divalproex Sodium 250 MG Oral Tab EC Take 750 mg by mouth at bedtime.     [name] HydroCHLOROthiazide  25 MG Oral Tab TAKE 1 TABLET BY MOUTH DAILY 28 tablet 4   . Lisinopril 40 MG Oral Tab TAKE 1 TABLET BY MOUTH DAILY (40 MG) 28 tablet 4   . Loratadine 10 MG Oral Tab Take 1 tablet (10 mg) by mouth daily. 28 tablet 0   . MetFORMIN HCl 500 MG Oral Tab TAKE 2 TABLETS (1000MG ) BY MOUTH TWICE A DAY 112 tablet 4   . Metoprolol Succinate ER 25 MG Oral TABLET SR 24 HR Take 0.5 tablets (12.5 mg) by mouth daily. 14 tablet 0   . Paliperidone Palmitate 234 MG/1.5ML Intramuscular Suspension Inject 234 mg intramuscularly every 4 weeks.     . Pravastatin Sodium 40 MG Oral Tab Take 1 tablet (40 mg) by mouth every evening. 30 tablet 6   . RaNITidine HCl 150 MG Oral Tab TAKE 1 TABLET BY MOUTH TWICE A DAY 56 tablet 4     No current facility-administered medications for this visit.      ALLERGIES:  Review of patient's allergies indicates:  No Known Allergies  CHIEF COMPLAINT: Form    INTERVAL HISTORY/HPI:  Feels well.  Taking all his meds as rx'd.  Denies SOB, wheeze, CP, fatigue muscle or joint pain.  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    PHYSICAL EXAM:  BP 157/65   Pulse 91   Temp 97.7 F (36.5 C) (Temporal)   Ht 5\' 4"  (1.626 m)   Wt 191 lb (86.6 kg)   BMI 32.79 kg/m   Pleasant WNWD AA DD male fairly groomed in NAD  HEENT: poor dentician, several missing and  decayed teeth, corrective lenses, BTM's clr., hearing intact to rubs B.  Neck: supple, no LAD or masses.  Lungs: CTA  CV: RRR, no M  Abd: obese, soft, +BS, no masses or TTP.  LE"s tr ankle edema , warm, +PT's B.  Skin: warm, dry, no rash on exposed areas.  Neuro: A+Ox3, gait steady with no assistive devices, All reflexes +2/2 B.  Vision: 20/40 B with lenses.    LAB RESULTS:  See below    ASSESSMENT AND PLAN:  Justin Zavala was seen today for form.    Diagnoses and all orders for this visit:    Diabetes mellitus due to underlying condition with hyperosmolarity without coma, without long-term current use of insulin ([place])  -     HEMOGLOBIN A1C, [place] care maintenance  -     influenza vaccine quadrivalent PF (adult) 0.5 mL IM - [id number]  Has dental appt.again on 02/15/16.    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  Recommended pt. Bring his Albuterol MDI with him and drink plenty of fluids during his bowling games.

## 2016-02-06 NOTE — Patient Instructions (Signed)
Please bring all of your medications to your next appointment.    If you have questions or concerns about your health, call [phone].

## 2016-02-06 NOTE — Progress Notes (Signed)
VACCINE SCREENING/ORDER WHEN CONTRAINDICATIONS PRESENT    Order date: 02/06/2016  Ordering provider: [place], [place] used: NO  Interpreter used?: No  Vaccine information sheet(s) discussed, patient/parent/guardian verbalized understanding? NO  Vaccines given: Influenza  VIS given 02/06/2016 by VI NGOC T LE, MA MA.      SCREENING: INACTIVATED VACCINES  NO 1. Do you have a high fever, severe cold-like symptoms or serious infection?  NO 2. Do you have any food allergies such as eggs, chicken, chicken feathers, chicken dander, or gelatin?  NO 3. Do you have any allergies to neomycin, streptomycin, or polymyxin?   NO 4. Have you had any severe reaction to a vaccine in the past such as a seizure, a convulsion from a high fever, or a paralysis problem called Guillain-Barre Syndrome? If so, which vaccine(s):   NO 5. QUESTIONS FOR WOMEN: Are you pregnant or planning on becoming pregnant in the next month?    AIf the patient/parent/or guardian answered "yes" to any of the above questions, consult with the provider to review the responses prior to administering vaccination. Parents with a contraindication to immunization will not receive the immunization without a specific physician order to vaccinate the patient and discussion of risk and benefits related to the contraindication.     Physician Order for Administration of Vaccine(s) When Contraindications Are Present  I have reviewed the existence in this patient's health history of possible contraindication(s) to administration of vaccine. The benefits to this patient outweigh the potential risks of immunization. Administer vaccine(s):  Hulen Skains T, MA, 02/06/2016 4:06 PM MA.

## 2016-02-07 ENCOUNTER — Telehealth (HOSPITAL_BASED_OUTPATIENT_CLINIC_OR_DEPARTMENT_OTHER): Payer: Self-pay | Admitting: Unknown Physician Specialty

## 2016-02-07 LAB — HEMOGLOBIN A1C, HPLC: Hemoglobin A1C: 5.5 % (ref 4.0–6.0)

## 2016-02-07 NOTE — Telephone Encounter (Signed)
(  TEXTING IS AN OPTION FOR [facility] ONLY)  Is this a [place] clinic? No      RETURN CALL: Detailed message on voicemail only      SUBJECT:  Appointment Request     REASON FOR REQUEST/SYMPTOMS: Routine follow up for diabetes  REFERRING PROVIDER: None  REQUEST APPOINTMENT WITH: Gust Rung, MD or Any providers  REQUESTED DATE: No Tuesdays, TIME: 2:30 pm and after preferred  UNABLE TO APPOINT BECAUSE: Per SOP, No schedule template for Gust Rung, MD. Patient was seen on 10/31 for Annual wellness exam and would like to schedule appointment to review his diabetes.

## 2016-02-08 NOTE — Telephone Encounter (Signed)
Pt has appt on 11/20 with Malissa Hippo to follow up for diabetes

## 2016-02-14 ENCOUNTER — Other Ambulatory Visit (HOSPITAL_BASED_OUTPATIENT_CLINIC_OR_DEPARTMENT_OTHER): Payer: Self-pay | Admitting: Unknown Physician Specialty

## 2016-02-14 DIAGNOSIS — I10 Essential (primary) hypertension: Secondary | ICD-10-CM

## 2016-02-14 MED ORDER — METOPROLOL SUCCINATE ER 25 MG OR TB24
EXTENDED_RELEASE_TABLET | ORAL | 5 refills | Status: DC
Start: 2016-02-14 — End: 2016-08-01

## 2016-02-26 ENCOUNTER — Ambulatory Visit (HOSPITAL_BASED_OUTPATIENT_CLINIC_OR_DEPARTMENT_OTHER): Payer: No Typology Code available for payment source | Admitting: Nurse Practitioner

## 2016-03-12 ENCOUNTER — Other Ambulatory Visit (HOSPITAL_BASED_OUTPATIENT_CLINIC_OR_DEPARTMENT_OTHER): Payer: Self-pay | Admitting: Unknown Physician Specialty

## 2016-03-12 DIAGNOSIS — J309 Allergic rhinitis, unspecified: Secondary | ICD-10-CM

## 2016-03-13 MED ORDER — LORATADINE 10 MG OR TABS
ORAL_TABLET | ORAL | 6 refills | Status: DC
Start: 2016-03-13 — End: 2016-09-24

## 2016-04-09 ENCOUNTER — Other Ambulatory Visit (HOSPITAL_BASED_OUTPATIENT_CLINIC_OR_DEPARTMENT_OTHER): Payer: Self-pay | Admitting: Unknown Physician Specialty

## 2016-04-09 DIAGNOSIS — K219 Gastro-esophageal reflux disease without esophagitis: Secondary | ICD-10-CM

## 2016-04-09 DIAGNOSIS — I10 Essential (primary) hypertension: Secondary | ICD-10-CM

## 2016-04-09 DIAGNOSIS — E118 Type 2 diabetes mellitus with unspecified complications: Secondary | ICD-10-CM

## 2016-04-10 MED ORDER — LISINOPRIL 40 MG OR TABS
ORAL_TABLET | ORAL | 2 refills | Status: DC
Start: 2016-04-10 — End: 2016-08-27

## 2016-04-10 MED ORDER — METFORMIN HCL 500 MG OR TABS
ORAL_TABLET | ORAL | 2 refills | Status: DC
Start: 2016-04-10 — End: 2016-08-27

## 2016-04-10 MED ORDER — RANITIDINE HCL 150 MG OR TABS
ORAL_TABLET | ORAL | 2 refills | Status: DC
Start: 2016-04-10 — End: 2016-08-27

## 2016-04-10 MED ORDER — HYDROCHLOROTHIAZIDE 25 MG OR TABS
ORAL_TABLET | ORAL | 2 refills | Status: DC
Start: 2016-04-10 — End: 2016-08-27

## 2016-05-14 ENCOUNTER — Other Ambulatory Visit (HOSPITAL_BASED_OUTPATIENT_CLINIC_OR_DEPARTMENT_OTHER): Payer: Self-pay | Admitting: Unknown Physician Specialty

## 2016-05-14 DIAGNOSIS — E785 Hyperlipidemia, unspecified: Secondary | ICD-10-CM

## 2016-05-14 MED ORDER — PRAVASTATIN SODIUM 40 MG OR TABS
ORAL_TABLET | ORAL | 5 refills | Status: DC
Start: 2016-05-14 — End: 2016-12-16

## 2016-06-05 ENCOUNTER — Ambulatory Visit (HOSPITAL_BASED_OUTPATIENT_CLINIC_OR_DEPARTMENT_OTHER)
Payer: No Typology Code available for payment source | Attending: Internal Medicine | Admitting: Unknown Physician Specialty

## 2016-06-05 ENCOUNTER — Encounter (HOSPITAL_BASED_OUTPATIENT_CLINIC_OR_DEPARTMENT_OTHER): Payer: Self-pay | Admitting: Unknown Physician Specialty

## 2016-06-05 VITALS — BP 143/56 | HR 78 | Temp 97.7°F | Resp 16 | Ht 64.0 in | Wt 195.0 lb

## 2016-06-05 DIAGNOSIS — I10 Essential (primary) hypertension: Secondary | ICD-10-CM | POA: Insufficient documentation

## 2016-06-05 DIAGNOSIS — F209 Schizophrenia, unspecified: Secondary | ICD-10-CM | POA: Insufficient documentation

## 2016-06-05 DIAGNOSIS — L603 Nail dystrophy: Secondary | ICD-10-CM | POA: Insufficient documentation

## 2016-06-05 DIAGNOSIS — Z6833 Body mass index (BMI) 33.0-33.9, adult: Secondary | ICD-10-CM

## 2016-06-05 DIAGNOSIS — E119 Type 2 diabetes mellitus without complications: Secondary | ICD-10-CM | POA: Insufficient documentation

## 2016-06-05 LAB — LIPID PANEL
Cholesterol (LDL): 109 mg/dL (ref <130)
Cholesterol/HDL Ratio: 5.1
HDL Cholesterol: 33 mg/dL — ABNORMAL LOW (ref >39)
Non-HDL Cholesterol: 134 mg/dL (ref 0–159)
Total Cholesterol: 167 mg/dL (ref <200)
Triglyceride: 126 mg/dL (ref <150)

## 2016-06-05 LAB — COMPREHENSIVE METABOLIC PANEL
ALT (GPT): 29 U/L (ref 10–64)
AST (GOT): 22 U/L (ref 9–38)
Albumin: 4.4 g/dL (ref 3.5–5.2)
Alkaline Phosphatase (Total): 113 U/L (ref 39–139)
Anion Gap: 9 (ref 4–12)
Bilirubin (Total): 1.6 mg/dL — ABNORMAL HIGH (ref 0.2–1.3)
Calcium: 9.8 mg/dL (ref 8.9–10.2)
Carbon Dioxide, Total: 28 meq/L (ref 22–32)
Chloride: 98 meq/L (ref 98–108)
Creatinine: 0.97 mg/dL (ref 0.51–1.18)
GFR, Calc, African American: >60 mL/min/{1.73_m2} (ref >59)
GFR, Calc, European American: >60 mL/min/{1.73_m2} (ref >59)
Glucose: 107 mg/dL (ref 62–125)
Potassium: 3.8 meq/L (ref 3.6–5.2)
Protein (Total): 7.2 g/dL (ref 6.0–8.2)
Sodium: 135 meq/L (ref 135–145)
Urea Nitrogen: 11 mg/dL (ref 8–21)

## 2016-06-05 LAB — CBC, DIFF
% Basophils: 0 %
% Eosinophils: 1 %
% Immature Granulocytes: 0 %
% Lymphocytes: 33 %
% Monocytes: 9 %
% Neutrophils: 57 %
Absolute Eosinophil Count: 0.03 10*3/uL (ref 0.00–0.50)
Absolute Lymphocyte Count: 1.13 10*3/uL (ref 1.00–4.80)
Basophils: 0.01 10*3/uL (ref 0.00–0.20)
Hematocrit: 42 % (ref 38–50)
Hemoglobin: 14.2 g/dL (ref 13.0–18.0)
Immature Granulocytes: 0 10*3/uL (ref 0.00–0.05)
MCH: 29.4 pg (ref 27.3–33.6)
MCHC: 33.6 g/dL (ref 32.2–36.5)
MCV: 87 fL (ref 81–98)
Monocytes: 0.31 10*3/uL (ref 0.00–0.80)
Neutrophils: 1.98 10*3/uL (ref 1.80–7.00)
Platelet Count: 197 10*3/uL (ref 150–400)
RBC: 4.83 10*6/uL (ref 4.40–5.60)
RDW-CV: 13.6 % (ref 11.6–14.4)
WBC: 3.46 10*3/uL — ABNORMAL LOW (ref 4.30–10.00)

## 2016-06-05 LAB — VALPROIC ACID (DEPAKENE): Valproic Acid (Depakene): 62 ug/mL (ref 50–100)

## 2016-06-05 LAB — LAB ADD ON ORDER

## 2016-06-05 LAB — SMEAR SCAN

## 2016-06-05 NOTE — Progress Notes (Signed)
Adult Medicine Clinic- Return Visit    Date: 06/05/2016    ID/CC:   [name] is a 51 year old male with developmental delay, schizophrenia, GERD, HTN, and T2DM here for follow up.      Subjective/Interval History:    Today, Justin Zavala is alone.  He states that he has been well.  He has no main concerns other than wanting a "check up".  He states that he  is feeling well overall.  He specifically denies any chest pain, constipation, diarrhea, nausea, vomiting, melena, hematochezia.  He denies any thoughts of hurting himself or others, states his mood is "good" and that he spends a lot of his time at [facility].  He still has some assistance with cooking, meal prep and a home nurse at least a few times a week (although I am unclear on the full extent of these services).      He does state that he does not exercise very much and that he gets SOB with walking more than 1/2 mile or so.  He states he can walk 1-2 flights of stairs before needing to rest.  He does have an inhaler, which he guesses he uses 2 times a week with improvement in SOB.      I attempted to complete a med rec with patient, but he was unable to tell me what medications he takes.  He states he did not take his medications today, but states that he usually takes them daily (says he just forgot today).  He does confirm that he gets his medications for schizophrenia, including an injection monthly, through [facility].     T2DM: last A1c 5.5%.  We started a statin at his last visit 6 months ago due to his DM. Still needs his eyes checked.     Objective:  BP 143/56   Pulse 78   Temp 97.7 F (36.5 C) (Temporal)   Resp 16   Ht 5\' 4"  (1.626 m)   Wt 195 lb (88.5 kg)   BMI 33.47 kg/m      Wt Readings from Last 3 Encounters:   06/05/16 195 lb (88.5 kg)   02/06/16 191 lb (86.6 kg)   11/03/15 180 lb (81.6 kg)      BP Readings from Last 3 Encounters:   06/05/16 143/56   02/06/16 157/65   11/03/15 134/58       Gen: well appearing male in no acute distress  Pulm: CTAB, no  wheezing, comfortable on RA  CV: RRR, extremities warm and well perfused  Foot exam:     - Visual inspection:  normal to inspection, with intact skin, no significant callus formation, no evidence of ischemia.  Long, dystrophic nails on all toes    - Monofilament exam:  unable to assess as patient answers yes regardless of what examiner does and is not able to understand the exam    - Pulse exam: normal          Assessment/Plan:  Justin Zavala was seen today for diabetes and follow up.      Type 2 diabetes mellitus: A1c last 5.5% 10/17.    Glycemic control:   - Metformin 1000 mg BID  - HbAIC recheck: 6 months   Macro complications:   HTN - above goal, see below   HLD - Prava 40   Micro complications:   Microalbuminuria - negative July 2017, repeat annually   Foot exam - done today, although difficult to assess due to developmental delay as patient  states he always feels monofilament even when not there   -Referral to nail care placed today   Retinopathy exam - referral to eye placed today, has not seen for several years     Schizophrenia, unspecified type: Patient reports good mood, denies SI/HI and reports medication compliance, no evidence of responding to internal stimuli and denies such stimuli today.  Has case worker Justin Zavala) and seen at [facility] for [facility].  He brings a request for blood work from [facility] today, I have ordered the requested labs and will fax the results to [facility] Luellen Pucker PA).  -     CBC, DIFF, VPA level, CMP, lipid panel ordered for [facility]; results will be faxed   -    Will call Justin Zavala and [facility] to confirm medications and any changes; will also request that they remind patient to bring his medications to future appointments    Essential hypertension: Above goal today  (esp given DM), although improved on recheck at end of visit.  Patient states he did not take his medications today, and is unable to tell me what medications he is taking.  Will refer to pharmacy for assistance with  medication rec and HTN medication optimization  -emphasized to patient to bring medications to all future appts  -Will call case worker to discuss medications as well  -Referral to pharmacy for HTN management   -Current meds: HCTZ 25, Lisinopril 40   -Also on metoprolol 12.5, unsure if for HTN (no other clear indication)--will consider stopping in future as unlikely to be of significant anti HTN benefit      Discussed with: Dr. Rebeca Alert

## 2016-06-05 NOTE — Patient Instructions (Signed)
Go to lab today    Come back and please bring ALL medications.  I will have the pharmacist go over them with you so that we can lower your blood pressure

## 2016-06-06 NOTE — Progress Notes (Signed)
I have personally discussed the case with the resident during or immediately after the patient visit including review of history, physical exam, diagnosis, and treatment plan. I agree with the assessment and plan of care.

## 2016-06-21 ENCOUNTER — Ambulatory Visit (HOSPITAL_BASED_OUTPATIENT_CLINIC_OR_DEPARTMENT_OTHER): Payer: No Typology Code available for payment source

## 2016-06-26 ENCOUNTER — Ambulatory Visit (HOSPITAL_BASED_OUTPATIENT_CLINIC_OR_DEPARTMENT_OTHER): Payer: No Typology Code available for payment source

## 2016-07-11 ENCOUNTER — Ambulatory Visit (HOSPITAL_BASED_OUTPATIENT_CLINIC_OR_DEPARTMENT_OTHER): Payer: Self-pay | Admitting: Pharmacy

## 2016-07-11 NOTE — Progress Notes (Signed)
Justin Zavala missed his appt last month with [place] but I was able to get the list of meds he gets from the pharmacy. He lives in an asst living facility so they bubble pack the meds.   The meds is is correct with the injection and oral meds.   I am not sure if he is still using albuterol. WIll assess at next PCP appt.

## 2016-08-01 ENCOUNTER — Other Ambulatory Visit (HOSPITAL_BASED_OUTPATIENT_CLINIC_OR_DEPARTMENT_OTHER): Payer: Self-pay | Admitting: Unknown Physician Specialty

## 2016-08-01 DIAGNOSIS — I10 Essential (primary) hypertension: Secondary | ICD-10-CM

## 2016-08-01 MED ORDER — METOPROLOL SUCCINATE ER 25 MG OR TB24
12.5000 mg | EXTENDED_RELEASE_TABLET | Freq: Every day | ORAL | 0 refills | Status: DC
Start: 2016-08-01 — End: 2016-10-18

## 2016-08-19 ENCOUNTER — Encounter (HOSPITAL_BASED_OUTPATIENT_CLINIC_OR_DEPARTMENT_OTHER): Payer: Self-pay

## 2016-08-19 ENCOUNTER — Encounter (HOSPITAL_BASED_OUTPATIENT_CLINIC_OR_DEPARTMENT_OTHER): Payer: No Typology Code available for payment source | Admitting: Unknown Physician Specialty

## 2016-08-19 NOTE — Progress Notes (Deleted)
Adult Medicine Clinic- Return Visit    Date: 08/19/2016    ID/CC:       Subjective/Interval History:    Med Rec  -Pharm called and confirmed med list as appropriate including his injections     Labs  CMP: slightly elevated bili 1.6  Valproic Acid: 62  Lipid  CBC with mild leukopenia, normal diff (no neutropenia or lymphopenia)    Leukopenia  -Negative HIV and Hep C  -Past results have been low normal (4s)      HTN  BP elevated to 159/68 and on recheck was 143/56  -HCTZ 25, lisinopril 40, metop 12.5    Mental Health, living situation   -Living at assisted living facility through which he gets his meds and a fair bit of other support   -Case worker [place], [place] as [facility] for mental health stuff     T2DM  -Eye, nail care referrals placed --> no show to nail care and did not schedule eye care appt     Asthma     Objective:  There were no vitals taken for this visit.     Wt Readings from Last 3 Encounters:   06/05/16 195 lb (88.5 kg)   02/06/16 191 lb (86.6 kg)   11/03/15 180 lb (81.6 kg)      BP Readings from Last 3 Encounters:   06/05/16 143/56   02/06/16 157/65   11/03/15 134/58       Gen:  Pulm:       Results for orders placed or performed in visit on 06/05/16   CBC, DIFF   Result Value Ref Range    WBC 3.46 (L) 4.30 - 10.00 10*3/uL    RBC 4.83 4.40 - 5.60 10*6/uL    Hemoglobin 14.2 13.0 - 18.0 g/dL    Hematocrit 42 38 - 50 %    MCV 87 81 - 98 fL    MCH 29.4 27.3 - 33.6 pg    MCHC 33.6 32.2 - 36.5 g/dL    Platelet Count 197 150 - 400 10*3/uL    RDW-CV 13.6 11.6 - 14.4 %    % Neutrophils 57 %    % Lymphocytes 33 %    % Monocytes 9 %    % Eosinophils 1 %    % Basophils 0 %    % Immature Granulocytes 0 %    Neutrophils 1.98 1.80 - 7.00 10*3/uL    Absolute Lymphocyte Count 1.13 1.00 - 4.80 10*3/uL    Monocytes 0.31 0.00 - 0.80 10*3/uL    Absolute Eosinophil Count 0.03 0.00 - 0.50 10*3/uL    Basophils 0.01 0.00 - 0.20 10*3/uL    Immature Granulocytes 0.00 0.00 - 0.05 10*3/uL   LIPID PANEL   Result Value Ref Range    Cholesterol (Total) 167 <200 mg/dL    Triglyceride 126 <150 mg/dL    Cholesterol (HDL) 33 (L) >39 mg/dL    Cholesterol (LDL) 109 <130 mg/dL    Non-HDL Cholesterol 134 0 - 159 mg/dL    Cholesterol/HDL Ratio 5.1     Lipid Panel, Additional Info. (NOTE)    VALPROIC ACID (DEPAKENE)   Result Value Ref Range    Valproic Acid (Depakene) 62 50 - 100 ug/mL   COMPREHENSIVE METABOLIC PANEL   Result Value Ref Range    Sodium 135 135 - 145 meq/L    Potassium 3.8 3.6 - 5.2 meq/L    Chloride 98 98 - 108 meq/L    Carbon Dioxide, Total 28 22 -  32 meq/L    Anion Gap 9 4 - 12    Glucose 107 62 - 125 mg/dL    Urea Nitrogen 11 8 - 21 mg/dL    Creatinine 0.97 0.51 - 1.18 mg/dL    Protein (Total) 7.2 6.0 - 8.2 g/dL    Albumin 4.4 3.5 - 5.2 g/dL    Bilirubin (Total) 1.6 (H) 0.2 - 1.3 mg/dL    Calcium 9.8 8.9 - 10.2 mg/dL    AST (GOT) 22 9 - 38 U/L    Alkaline Phosphatase (Total) 113 39 - 139 U/L    ALT (GPT) 29 10 - 64 U/L    GFR, Calc, European American >60 >59 mL/min/[1.73_m2]    GFR, Calc, African American >60 >59 mL/min/[1.73_m2]    GFR, Information       Calculated GFR in mL/min/1.73 m2 by MDRD equation.  Inaccurate with changing renal function.  See http://depts.[email or web address].[email or web address]   LAB ADD ON ORDER   Result Value Ref Range    Lab Test Requested Smear scan, eval for WBC morphology     Specimen Type/Description Blood     Sample To Use Most recent     Test Request Status Order Processed    SMEAR SCAN   Result Value Ref Range    Smear Scan No significant WBC morphological abnormalities.          Assessment/Plan:  There are no diagnoses linked to this encounter.        Follow-up: ***    Discussed with: ***

## 2016-08-27 ENCOUNTER — Other Ambulatory Visit (HOSPITAL_BASED_OUTPATIENT_CLINIC_OR_DEPARTMENT_OTHER): Payer: Self-pay | Admitting: Unknown Physician Specialty

## 2016-08-27 ENCOUNTER — Ambulatory Visit (HOSPITAL_BASED_OUTPATIENT_CLINIC_OR_DEPARTMENT_OTHER)
Payer: No Typology Code available for payment source | Attending: Internal Medicine | Admitting: Unknown Physician Specialty

## 2016-08-27 VITALS — BP 132/54 | HR 85 | Temp 97.7°F | Ht 64.0 in | Wt 182.2 lb

## 2016-08-27 DIAGNOSIS — R634 Abnormal weight loss: Secondary | ICD-10-CM | POA: Insufficient documentation

## 2016-08-27 DIAGNOSIS — E118 Type 2 diabetes mellitus with unspecified complications: Secondary | ICD-10-CM

## 2016-08-27 DIAGNOSIS — K219 Gastro-esophageal reflux disease without esophagitis: Secondary | ICD-10-CM

## 2016-08-27 DIAGNOSIS — R5383 Other fatigue: Secondary | ICD-10-CM | POA: Insufficient documentation

## 2016-08-27 DIAGNOSIS — D72819 Decreased white blood cell count, unspecified: Secondary | ICD-10-CM | POA: Insufficient documentation

## 2016-08-27 DIAGNOSIS — Z6831 Body mass index (BMI) 31.0-31.9, adult: Secondary | ICD-10-CM

## 2016-08-27 DIAGNOSIS — R159 Full incontinence of feces: Secondary | ICD-10-CM | POA: Insufficient documentation

## 2016-08-27 DIAGNOSIS — R7989 Other specified abnormal findings of blood chemistry: Secondary | ICD-10-CM | POA: Insufficient documentation

## 2016-08-27 DIAGNOSIS — I10 Essential (primary) hypertension: Secondary | ICD-10-CM

## 2016-08-27 DIAGNOSIS — D649 Anemia, unspecified: Secondary | ICD-10-CM | POA: Insufficient documentation

## 2016-08-27 NOTE — Patient Instructions (Signed)
We will check labs    I will also order a CT scan to look at your belly.  They will call you to schedule.  I will give them [name] number to make sure you can come in

## 2016-08-27 NOTE — Progress Notes (Signed)
Adult Medicine Clinic- Return Visit    Date: 08/27/2016    ID/CC:   Mr. Decarlo is a 51 year old male with developmental delay, schizophrenia, GERD, HTN, and T2DM here for follow up.  He is here today with his case manager, [name].     Subjective/Interval History:  I last  saw Mr. Frady 06/05/16.  At this visit, we did labs at the request of his [place] provider Margaretha Glassing), which I faxed to his clinic.       In the interim, I received a fax from patient's [place] provider ([facility], Margaretha Glassing PA-C) stating that patient's "labs have changed a bit since Feb-He's been feeling a bit unwell -sluggish-having more incontinence".  He is here today to review these changes.   -Faxed labs were notable for (dated 08/14/16)  CBC: WBC 3.5, Hgb 12.1, Hct 37.7, Plt 204   BMP: Na 141, K 4.0, Cl 102, Co2 27, BUN 15, Cr 1.09, Glucose 123, AST 47, ALT 100, Bili 0.9, Protein 6.9, Albumin 3.9, Ca 9.8  Lipid panel Chol 176, TG 102, HDL 30, LDL 126    Today, patient states that "takes naps in the day".  He states he feels tired a lot, sleeping more in the day.  He thinks his appetite is  OK.  Caregiver states that he thinks he is eating less though.  Per our recordings, has lost 12 pounds since our last visit.  Dilan states that he hasn't made any dietary changes or efforts to lose weight though.     Fecal incontinence, change in bowel habits: Having 3-4 BM daily.  Patient states he isn't sure how long this is going on, but on repeat questioning states maybe a month?  He states that he used to "go less".  Describes the BM as solid, formed brown, denies any diarrhea.  No pain, no abd pain.  No dysuria or urinary incontinence.     [name] states that he hasn't heard about increased BM until today, but does state that fecal incontinence has been an issue over the past several weeks--people at facility he goes to have noticed poor hygiene with feces on his clothes or on chairs after he sits in them.  Unsure if having incontinence or just poor hygiene.   When asked about this, Korie states he isn't sure what is going on, but thinks sometimes he doesn't have enough time to get to the bathroom when he realizes he has to go.  Notably, hygiene has been an issue in the past per chart review, and case worker confirms this has been an issue for several years, but the fecal incontinence is new and very worrisome to them.  Needs a lot of prompting from sister to do hygiene at baseline.       Regarding patient's pysch conditions, last saw his [place] provider earlier this month. No changes to meds made.  Patient states he is feeling more tired and sluggish and depressed.  Others have noticed more lethargy, flattened affect and not as engaged in care, people close to him are worried.  Psych provider wants to rule out medical causes prior to adjusting medications.     He denies any fevers or chills, no chest pain or SOB, no night sweats, no abdominal pain, no back pain, no dysuria no hematuria.  Denies any tobacco/etoh/drug use and not using tylenol or ibuprofen (only Rx meds)    Social History   Lives independently in apartment, his sister lives nearby and helps  out a lot.  Lives on [place]. Has help 4 days a week 68 hrs/month though dev disability assc.  Programing though [employer] program comes in for groups 2 days a week  and sees his case manager, patrick every Aug 08, 2022.  His mother died about 2 years ago.     Medications: patient unsure of names, but states that he takes everything that the pharmacy gives him.  Continues on injectible antipsychotic. Pharmacist confirmed medications are being filled and that our med rec is appropriate in epic previously     Objective:  BP 132/54    Pulse 85    Temp 97.7 F (36.5 C) (Temporal)    Ht 5\' 4"  (1.626 m)    Wt 182 lb 3.2 oz (82.6 kg)    BMI 31.27 kg/m      Wt Readings from Last 3 Encounters:   08/27/16 182 lb 3.2 oz (82.6 kg)   06/05/16 195 lb (88.5 kg)   02/06/16 191 lb (86.6 kg)      BP Readings from Last 3 Encounters:    08/27/16 132/54   06/05/16 143/56   02/06/16 157/65     Gen: middle aged male in NAD   Pulm: CTAB, no increased WOB or respiratory distress   CV: RRR, no murmur appreciated   Abdomen: +BS, obese, soft and nontender.  No RUQ tenderness on palpation.    MSK: normal muscle bulk and tone   Neuro: normal gait, no focal deficits appreciated   Psych: interactive, well dressed and well groomed today      Results for orders placed or performed in visit on 06/05/16   CBC, DIFF   Result Value Ref Range    WBC 3.46 (L) 4.30 - 10.00 10*3/uL    RBC 4.83 4.40 - 5.60 10*6/uL    Hemoglobin 14.2 13.0 - 18.0 g/dL    Hematocrit 42 38 - 50 %    MCV 87 81 - 98 fL    MCH 29.4 27.3 - 33.6 pg    MCHC 33.6 32.2 - 36.5 g/dL    Platelet Count 197 150 - 400 10*3/uL    RDW-CV 13.6 11.6 - 14.4 %    % Neutrophils 57 %    % Lymphocytes 33 %    % Monocytes 9 %    % Eosinophils 1 %    % Basophils 0 %    % Immature Granulocytes 0 %    Neutrophils 1.98 1.80 - 7.00 10*3/uL    Absolute Lymphocyte Count 1.13 1.00 - 4.80 10*3/uL    Monocytes 0.31 0.00 - 0.80 10*3/uL    Absolute Eosinophil Count 0.03 0.00 - 0.50 10*3/uL    Basophils 0.01 0.00 - 0.20 10*3/uL    Immature Granulocytes 0.00 0.00 - 0.05 10*3/uL   LIPID PANEL   Result Value Ref Range    Cholesterol (Total) 167 <200 mg/dL    Triglyceride 126 <150 mg/dL    Cholesterol (HDL) 33 (L) >39 mg/dL    Cholesterol (LDL) 109 <130 mg/dL    Non-HDL Cholesterol 134 0 - 159 mg/dL    Cholesterol/HDL Ratio 5.1     Lipid Panel, Additional Info. (NOTE)    VALPROIC ACID (DEPAKENE)   Result Value Ref Range    Valproic Acid (Depakene) 62 50 - 100 ug/mL   COMPREHENSIVE METABOLIC PANEL   Result Value Ref Range    Sodium 135 135 - 145 meq/L    Potassium 3.8 3.6 - 5.2 meq/L    Chloride 98 98 -  108 meq/L    Carbon Dioxide, Total 28 22 - 32 meq/L    Anion Gap 9 4 - 12    Glucose 107 62 - 125 mg/dL    Urea Nitrogen 11 8 - 21 mg/dL    Creatinine 0.97 0.51 - 1.18 mg/dL    Protein (Total) 7.2 6.0 - 8.2 g/dL    Albumin 4.4 3.5  - 5.2 g/dL    Bilirubin (Total) 1.6 (H) 0.2 - 1.3 mg/dL    Calcium 9.8 8.9 - 10.2 mg/dL    AST (GOT) 22 9 - 38 U/L    Alkaline Phosphatase (Total) 113 39 - 139 U/L    ALT (GPT) 29 10 - 64 U/L    GFR, Calc, European American >60 >59 mL/min/[1.73_m2]    GFR, Calc, African American >60 >59 mL/min/[1.73_m2]    GFR, Information       Calculated GFR in mL/min/1.73 m2 by MDRD equation.  Inaccurate with changing renal function.  See [email or web address]   LAB ADD ON ORDER   Result Value Ref Range    Lab Test Requested Smear scan, eval for WBC morphology     Specimen Type/Description Blood     Sample To Use Most recent     Test Request Status Order Processed    SMEAR SCAN   Result Value Ref Range    Smear Scan No significant WBC morphological abnormalities.          Assessment/Plan:  Tahjae was seen today for test results, bowel problems, follow-up  and blood pressure.    Diagnoses and all orders for this visit:    Fatigue  Weight loss   Patient with increased fatigue, feeling generally 'unwell' with 13 pound weight loss since last visit (unintentional).  Notably also with lab abnormalities including elevated LFTs, new anemia, and worsened leukopenia.  Mental health provider, case worker, and sister have noticed change and are quite worried.  Patient denies localizing sx and generally does not have many complaints today other than fatigue (although history is challenging given his developmental delay).  Although it is possible that he is having worsening psychiatric symptoms resulting in many of these changes (specifically weight loss, fatigue, behavior changes), in light of his new lab abnormalities, I am also concerned for an underlying medical explanation. Given constellation of lab changes and limited ability to obtain clear history, will plan on repeating labs and also obtaining abdominal imaging to rule out an underlying malignancy or pathology.    -TSH with reflex T4  -CT abdomen and  pelvis   -Additional work up for other lab abnormalities as below  -Continue to work with his case Freight forwarder and psychiatric provider     Fecal incontinence: unclear etiology.  Patient denies diarrhea/abdominal pain/melena/hematochezia, but is unable to tell me much regarding his sx.  Could be due to worsening of his psychiatric disease (as poor hygiene has been of concern before), but in light of his lab abnormalities, weight loss, and feeling unwell also concern for true abdominal pathology.  Benign abd exam today. Will start with evaluation with imaging as above, but low threshold to obtain further evaluation with colonoscopy in the future if ongoing and nonrevealing initial work up.  Will also continue to explore role psych condition may be playing as well.     Elevated LFTs: new on labs this month. Review of prior lab data has not shown prior elevations in past.  Previously hep C, HIV negative.  No RUQ pain, no prior abdominal imaging.  Denies any tyelnol use, no etoh or drug use. No new medications or changes lately.    -Repeat CMP today   -Hep B   -CT abdomen as above    Leukopenia, unspecified type: previously has had low normal WBC for years, with last labs showing slightly low WBC as well, worsened on recent labs repeated this month.  Feb smear showed no WBC abnormalities.  Previously HIV, hep C negative. Ddx includes 2/2 med side effects (specifically valproate), may also be benign variant given chronically low normal values.  Will evaluate for nutritional deficiency as well today with B12, folate, iron studies.    -Repeat CBC w/ diff today, check B12, folate, iron studies (and hep B as above)  -Discuss with pharmacy if any of his psych meds can cause leukopenia (will also try to check in with his mental health provider regarding this), will defer making changes pending repeat labs though      Normocytic anemia: Hct 12 on OSH labs earlier this month, previously with no anemia on last labs.  Denies any  bleeding clinically.  Notably also with leukopenia as above.   -repeat CBC today   -Iron studies, B12 to further evaluate     Type 2 diabetes mellitus with complication, without long-term current use of insulin (HCC)  - Metformin 1000 mg BID  - HLD - Prava 40   - Repeat A1c and microalbuminuria screen (negative 7/17)  -referrals to nail and eye care placed previously but patient did not go, will readdress at next visit     Schizophrenia: possibly worsening mood sx as above, but difficult to distinguish fully from possible new sx otherwise given developmental delay and limited history. Following regularly with Margaretha Glassing at [facility] and his case [place].    -Continue psych medications per mental health provider (currently palperidone IM q28 days and divalproex 750 daily)   -Follow up with work up as above, if non revealing consider discussion with [place] provider about worsened sx due to underlying mental health conditions     HTN: HTN at last visit (but hadn't taken meds), near goal today   -Continue current meds: HCTZ 25, Lisinopril 40, metoprolol 12.5               -Unsure if metoprolol is significantly benefiting for HTN (no other clear indication per my chart review)--will consider stopping in future    Not discussed: asthma, GERD     Follow-up: 1 month     Discussed with: Dr. Jeneen Rinks

## 2016-08-28 MED ORDER — LISINOPRIL 40 MG OR TABS
ORAL_TABLET | ORAL | 1 refills | Status: DC
Start: 2016-08-28 — End: 2016-10-18

## 2016-08-28 MED ORDER — HYDROCHLOROTHIAZIDE 25 MG OR TABS
ORAL_TABLET | ORAL | 1 refills | Status: DC
Start: 2016-08-28 — End: 2016-10-18

## 2016-08-28 MED ORDER — RANITIDINE HCL 150 MG OR TABS
ORAL_TABLET | ORAL | 1 refills | Status: DC
Start: 2016-08-28 — End: 2016-10-18

## 2016-08-28 MED ORDER — METFORMIN HCL 500 MG OR TABS
ORAL_TABLET | ORAL | 1 refills | Status: DC
Start: 2016-08-28 — End: 2016-10-18

## 2016-08-28 NOTE — Telephone Encounter (Signed)
Refill request received for metformin has been approved.  However last found A1c was on 02/06/16.    Forward to PCP to review - would you like to add A1c to labs ordered on 08/27/16?

## 2016-08-30 ENCOUNTER — Ambulatory Visit (HOSPITAL_BASED_OUTPATIENT_CLINIC_OR_DEPARTMENT_OTHER)
Admit: 2016-08-30 | Discharge: 2016-08-30 | Disposition: A | Discharge status: Home / Self Care | Payer: No Typology Code available for payment source | Attending: Internal Medicine | Admitting: Internal Medicine

## 2016-08-30 ENCOUNTER — Other Ambulatory Visit (HOSPITAL_BASED_OUTPATIENT_CLINIC_OR_DEPARTMENT_OTHER): Payer: No Typology Code available for payment source | Admitting: Hematology & Oncology

## 2016-08-30 ENCOUNTER — Other Ambulatory Visit (HOSPITAL_BASED_OUTPATIENT_CLINIC_OR_DEPARTMENT_OTHER): Payer: Self-pay | Admitting: Unknown Physician Specialty

## 2016-08-30 DIAGNOSIS — R7989 Other specified abnormal findings of blood chemistry: Secondary | ICD-10-CM | POA: Insufficient documentation

## 2016-08-30 DIAGNOSIS — R5383 Other fatigue: Secondary | ICD-10-CM | POA: Insufficient documentation

## 2016-08-30 DIAGNOSIS — D649 Anemia, unspecified: Secondary | ICD-10-CM

## 2016-08-30 DIAGNOSIS — E118 Type 2 diabetes mellitus with unspecified complications: Secondary | ICD-10-CM | POA: Insufficient documentation

## 2016-08-30 DIAGNOSIS — D72819 Decreased white blood cell count, unspecified: Secondary | ICD-10-CM | POA: Insufficient documentation

## 2016-08-30 DIAGNOSIS — D519 Vitamin B12 deficiency anemia, unspecified: Secondary | ICD-10-CM

## 2016-08-30 DIAGNOSIS — D709 Neutropenia, unspecified: Secondary | ICD-10-CM

## 2016-08-30 LAB — TSH WITH REFLEXIVE FREE T4: TSH with Reflexive Free T4: 1.973 u[IU]/mL (ref 0.400–5.000)

## 2016-08-30 LAB — CBC, DIFF
% Basophils: 0 %
% Eosinophils: 2 %
% Immature Granulocytes: 0 %
% Lymphocytes: 30 %
% Monocytes: 15 %
% Neutrophils: 53 %
% Nucleated RBC: 0 %
Absolute Eosinophil Count: 0.05 10*3/uL (ref 0.00–0.50)
Absolute Lymphocyte Count: 0.78 10*3/uL — ABNORMAL LOW (ref 1.00–4.80)
Basophils: 0.01 10*3/uL (ref 0.00–0.20)
Hematocrit: 36 % — ABNORMAL LOW (ref 38–50)
Hemoglobin: 12.1 g/dL — ABNORMAL LOW (ref 13.0–18.0)
Immature Granulocytes: 0 10*3/uL (ref 0.00–0.05)
MCH: 29 pg (ref 27.3–33.6)
MCHC: 33.5 g/dL (ref 32.2–36.5)
MCV: 87 fL (ref 81–98)
Monocytes: 0.39 10*3/uL (ref 0.00–0.80)
Neutrophils: 1.4 10*3/uL — ABNORMAL LOW (ref 1.80–7.00)
Nucleated RBC: 0 10*3/uL
Platelet Count: 175 10*3/uL (ref 150–400)
RBC: 4.17 10*6/uL — ABNORMAL LOW (ref 4.40–5.60)
RDW-CV: 14.3 % (ref 11.6–14.4)
WBC: 2.63 10*3/uL — ABNORMAL LOW (ref 4.30–10.00)

## 2016-08-30 LAB — ALBUMIN/CREATININE RATIO, RANDOM URINE
Albumin (Micro), URN: 2.28 mg/dL
Albumin/Creatinine Ratio, URN: 9 mg/g{creat} (ref <30)
Creatinine/Unit, URN: 264 mg/dL

## 2016-08-30 LAB — COMPREHENSIVE METABOLIC PANEL
ALT (GPT): 43 U/L (ref 10–64)
AST (GOT): 27 U/L (ref 9–38)
Albumin: 4.3 g/dL (ref 3.5–5.2)
Alkaline Phosphatase (Total): 102 U/L (ref 39–139)
Anion Gap: 8 (ref 4–12)
Bilirubin (Total): 1.5 mg/dL — ABNORMAL HIGH (ref 0.2–1.3)
Calcium: 9.7 mg/dL (ref 8.9–10.2)
Carbon Dioxide, Total: 28 meq/L (ref 22–32)
Chloride: 101 meq/L (ref 98–108)
Creatinine: 0.94 mg/dL (ref 0.51–1.18)
GFR, Calc, African American: >60 mL/min/{1.73_m2} (ref >59)
GFR, Calc, European American: >60 mL/min/{1.73_m2} (ref >59)
Glucose: 122 mg/dL (ref 62–125)
Potassium: 3.3 meq/L — ABNORMAL LOW (ref 3.6–5.2)
Protein (Total): 6.8 g/dL (ref 6.0–8.2)
Sodium: 137 meq/L (ref 135–145)
Urea Nitrogen: 10 mg/dL (ref 8–21)

## 2016-08-30 LAB — HEPATITIS B BATTERY (HBSAG W/RFLX PCR, ANTI-HBS, ANIT-HBC)
Hepatitis B Core Ab: NONREACTIVE
Hepatitis B Surf Antibody Intl Units: <8 m[IU]/mL
Hepatitis B Surface Ab: NONREACTIVE
Hepatitis B Surface Antigen w/Reflex: NONREACTIVE

## 2016-08-30 LAB — LAB ADD ON ORDER

## 2016-08-30 LAB — LACTATE DEHYDROGENASE: Lactate Dehydrogenase: 197 U/L (ref <210)

## 2016-08-30 LAB — RETICULOCYTE COUNT
Absolute Reticulocyte Count: 87 10*9/L (ref 20–100)
RETIC HEMOGLOBIN EQUIVALENT: 35.8 pg (ref 28.0–38.0)
Reticulocyte Count, %: 2.1 % — ABNORMAL HIGH (ref 0.5–2.0)

## 2016-08-30 LAB — FOLATE & VITAMIN B12
Folate, SRM: 11.7 ng/mL (ref >5.8)
Vitamin B12 (Cobalamin): 176 pg/mL — ABNORMAL LOW (ref 180–914)

## 2016-08-30 LAB — IRON BINDING CAPACITY (W/IRON, TRANSFERRIN & TRANSF SAT)
Iron, SRM: 64 ug/dL (ref 31–171)
Total Iron Binding Capacity: 360 ug/dL (ref 250–460)
Transferrin Saturation: 18 % (ref 15–50)
Transferrin: 257 mg/dL (ref 180–329)

## 2016-08-30 LAB — HAPTOGLOBIN (QUANT): Haptoglobin (Quant): 136 mg/dL (ref 44–215)

## 2016-08-30 LAB — HEMOGLOBIN A1C, HPLC: Hemoglobin A1C: 5.9 % (ref 4.0–6.0)

## 2016-08-30 LAB — BILIRUBIN (DIRECT): Bilirubin (Direct): 0.3 mg/dL (ref 0.0–0.3)

## 2016-08-30 LAB — FERRITIN: Ferritin: 56 ng/mL (ref 20–230)

## 2016-08-30 LAB — SMEAR SCAN

## 2016-08-30 MED ORDER — CYANOCOBALAMIN 1000 MCG OR TABS
1000.0000 ug | ORAL_TABLET | Freq: Every day | ORAL | 6 refills | Status: DC
Start: 2016-08-30 — End: 2017-12-31

## 2016-08-30 NOTE — Progress Notes (Signed)
I have personally discussed the case with the resident during or immediately after the patient visit including review of history, physical exam, diagnosis, and treatment plan. I agree with the assessment and plan of care.

## 2016-08-30 NOTE — Progress Notes (Signed)
Tried to call Justin Zavala with lab results, unable to reach (have also tried to reach his case worker, [name]).  Will try to call back to give update.     Given B12 deficiency shown on labs, will send B12 rx to Justin Zavala pharmacy to replete.  Will also place hematology e-consult to further review.

## 2016-08-30 NOTE — Progress Notes (Signed)
Hematology Econsult Question:  I am referring this 51 year old male for anemia and leukopenia.      My clinical question: 51 year old male with PMH significant for schizophrenia, developmental delay, T2DM, asthma, HTN presenting with new anemia and leukopenia and recent fatigue and weight loss. Has had low normal WBC for years and began dropping earlier this year. On multiple psychiatric medications (long acting injectable antipsychotic and valproate) w/ stable doses, unsure if this could be contributing. Notably anemia is new from labs this May. Initial work up has shown low B12, slightly elevated unconjugated bili, normal LDH (haptoglobin and retic ct pending). Previously HIV and Hep C negative.  Smear scan with no WBC morphology but + burr cells.     Please have the following studies available in EpicCare or [facility]:   - CBC with differential, Reticulocyte count, iron, ferritin, TIBC   - Vitamin B12 (and methyl malonic acid if B12 low or if MCV > 100), folate,   - LDH, Creatinine, LFTs.    History of Present Illness:  Justin Zavala is a 51 year old male with schizophrenia on stable doses of multiple psychiatric medications. He developed leukopenia along with systemic symptoms of fatigue and weight loss in early 2018 and then subsequently developed anemia.     His lab work 04/2015 showed a normal CBC including normal WBC 4.34          Hemoglobin 14.    His next labs, 05/2016 showed a mild leukopenia with WBC 3.46 with normal ANC and normal CBC otherwise.    3 months later, 08/30/16 his lab work shows progressive leukopenia with WBC 3.46 associated with a low ANC 1.4, low ALC 0.78 and for the first time associated with a normocytic anemia 12.1 (MCV 87).    His vitamin B12 is low at 176.     Ferritin normal at 56, transferrin saturation normal at 18.   LDH normal, total bilirubin elevated at 1.6 and 1.5 in February and May of this year.     Outpatient Medications Prior to Visit   Medication Sig Dispense Refill     Albuterol Sulfate HFA 108 (90 Base) MCG/ACT Inhalation Aero Soln Inhale 2 puffs by mouth every 6 hours as needed. For shortness of breath. 1 Inhaler 6    Clotrimazole 1 % External Cream Apply 1 application topically 2 times a day. Apply to feet for fungal rash. 45 g 3    Cyanocobalamin 1000 MCG Oral Tab Take 1 tablet (1,000 mcg) by mouth daily. 30 tablet 6    Divalproex Sodium 250 MG Oral Tab EC Take 750 mg by mouth at bedtime.      HydroCHLOROthiazide 25 MG Oral Tab TAKE 1 TABLET BY MOUTH DAILY 28 tablet 1    Lisinopril 40 MG Oral Tab TAKE 1 TABLET BY MOUTH DAILY (40 MG) 28 tablet 1    Loratadine 10 MG Oral Tab TAKE 1 TABLET BY MOUTH DAILY 28 tablet 6    MetFORMIN HCl 500 MG Oral Tab TAKE 2 TABLETS (1000MG ) BY MOUTH TWICE A DAY 112 tablet 1    Metoprolol Succinate ER 25 MG Oral TABLET SR 24 HR Take 0.5 tablets (12.5 mg) by mouth daily. 45 tablet 0    Paliperidone Palmitate 234 MG/1.5ML Intramuscular Suspension Inject 234 mg intramuscularly every 4 weeks.      Pravastatin Sodium 40 MG Oral Tab TAKE 1 TABLET BY MOUTH EVERY EVENING 28 tablet 5    RaNITidine HCl 150 MG Oral Tab TAKE  1 TABLET BY MOUTH TWICE A DAY 56 tablet 1     No facility-administered medications prior to visit.      Impression and Plan:  1. Leukopenia and mild neutropenia, likely due to B12 deficiency  2. Normocytic anemia, likely due to B12 deficiency  3. b12 deficiency, likely from Metformin use  4. Metformin use     Thank you for inviting [place] to contribute to Justin Zavala care through econsultation. Justin Zavala developing mild leukopenia with neutropenia and his mild normocytic anemia is likely due, at least in part to significant B12 deficiency which can be attributed to metformin use. As B12 deficiency can cause both of these hematologic abnormalities I recommend aggressive replacement of his B12 deficiency to normalization, and assess if these cytopenias resolve with normalization.     I recommend 1000 mcg of B12 intramuscular  injection every other day for 1-2 weeks, then weekly for a month, and then monthly. If he is going to stay on metformin, I recommend injection B12 to overcome the affect of metformin on absorption or use of high dose oral B12.      If his cytopenias are due to B12 deficiency we would expect to see improvement in 2 weeks. Thus I recommend checking a B12, methylmalonic acid, CBC and differential in 2 weeks (sooner if he is feeling more unwell). If improvement in both the WBC and hemoglobin are noted, continue therapy and recheck these labs in another 4-8 weeks to assess for normalization.     If his WBC, neutrophil count or hemoglobin worsen or persist despite the b12 therapy please recontact the hematology team to review his case at that time.  My expectation is that he should feel improved from a symptom standpoint as B12 normalizes and his blood counts normalize, I anticipate that his symptoms are due to condition that is not hematologic however please re-contact the econsult hematology team at that time to re-evaluate his case if no other explanations are clear.    Thank you for allowing [place] to contribute to Justin Zavala care.

## 2016-09-04 ENCOUNTER — Encounter (HOSPITAL_BASED_OUTPATIENT_CLINIC_OR_DEPARTMENT_OTHER): Payer: Self-pay | Admitting: Unknown Physician Specialty

## 2016-09-04 ENCOUNTER — Telehealth (HOSPITAL_BASED_OUTPATIENT_CLINIC_OR_DEPARTMENT_OTHER): Payer: Self-pay | Admitting: Unknown Physician Specialty

## 2016-09-04 NOTE — Telephone Encounter (Signed)
Tried to call both patient and care giver to discuss lab results as well as heme e-consult recommendations several times this week--have left messages but not been able to reach patient.      As have been unable to reach patient, will start with PO B12 repletion (as opposed to trying to coordinate IM B12 per heme recs), as it is logistically difficult for patient to come in for frequent appointments.  I sent PO B12 to patient's pharmacy last week, and I have called and asked them to fill this medication and provide it to patient with his next medication refills (receives bubble packs).     Will attempt to connect with patient later this week to ensure that he received his medications.  Will also try to arrange for patient to come in for repeat labs in apprx 2 weeks to ensure repletion is improving B12 and correcting cytopenias.

## 2016-09-05 ENCOUNTER — Telehealth (HOSPITAL_BASED_OUTPATIENT_CLINIC_OR_DEPARTMENT_OTHER): Payer: Self-pay | Admitting: Unknown Physician Specialty

## 2016-09-05 NOTE — Telephone Encounter (Signed)
I call the patient today left message to his voicemail appointment for CT on 09/19/2016 @ 2:00 pm, send letter mail.

## 2016-09-06 ENCOUNTER — Other Ambulatory Visit (HOSPITAL_BASED_OUTPATIENT_CLINIC_OR_DEPARTMENT_OTHER): Payer: Self-pay | Admitting: Unknown Physician Specialty

## 2016-09-06 DIAGNOSIS — D72819 Decreased white blood cell count, unspecified: Secondary | ICD-10-CM

## 2016-09-09 ENCOUNTER — Telehealth (HOSPITAL_BASED_OUTPATIENT_CLINIC_OR_DEPARTMENT_OTHER): Payer: Self-pay | Admitting: Obstetrics/Gynecology

## 2016-09-09 NOTE — Telephone Encounter (Signed)
RN called patient caregiver(Justin Zavala) per Dr. Bobbye Charleston.  Per Justin Zavala it was not covered by [employer], awaiting insurance/authorization. Will follow up with Nurse(in house) tomorrow.    RN advised of follow up lab work after starting medication w/in 2wks.  Justin Zavala verbalized understanding.  No further questions/concerns.  Note forwarded to PCP.      Bubba Camp, MD sent to [place] Staff Pool            Hello,     Can you please call [name] (or his care giver, Justin Zavala, number [phone] (obtained permission to do so at his last visit)) and ask:   1. Make sure patient is taking B12 that I sent to his pharmacy previously (new medication, for his low WBC and anemia)     2. Ask him to get repeat labs done in 1-2 weeks to make sure his blood counts are improving     Thank you! Please let me know if any questions

## 2016-09-09 NOTE — Telephone Encounter (Signed)
-----   Message from Bubba Camp, MD sent at 09/06/2016  7:16 PM PDT -----  Hello,     Can you please call Mr. Radu (or his care giver, [name], number [phone] (obtained permission to do so at his last visit)) and ask:   1. Make sure patient is taking B12 that I sent to his pharmacy previously (new medication, for his low WBC and anemia)    2. Ask him to get repeat labs done in 1-2 weeks to make sure his blood counts are improving     Thank you! Please let me know if any questions   Lauren

## 2016-09-18 ENCOUNTER — Other Ambulatory Visit (HOSPITAL_BASED_OUTPATIENT_CLINIC_OR_DEPARTMENT_OTHER): Payer: Self-pay | Admitting: Unknown Physician Specialty

## 2016-09-18 DIAGNOSIS — E538 Deficiency of other specified B group vitamins: Secondary | ICD-10-CM

## 2016-09-18 DIAGNOSIS — D649 Anemia, unspecified: Secondary | ICD-10-CM | POA: Insufficient documentation

## 2016-09-18 DIAGNOSIS — D72819 Decreased white blood cell count, unspecified: Secondary | ICD-10-CM | POA: Insufficient documentation

## 2016-09-19 ENCOUNTER — Ambulatory Visit (HOSPITAL_BASED_OUTPATIENT_CLINIC_OR_DEPARTMENT_OTHER): Payer: No Typology Code available for payment source | Attending: Internal Medicine

## 2016-09-19 DIAGNOSIS — R634 Abnormal weight loss: Secondary | ICD-10-CM

## 2016-09-19 DIAGNOSIS — D649 Anemia, unspecified: Secondary | ICD-10-CM

## 2016-09-19 LAB — CREATININE BY I_STAT POC, HMC: Creatinine (POC): 1 mg/dL (ref 0.51–1.18)

## 2016-09-24 ENCOUNTER — Other Ambulatory Visit (HOSPITAL_BASED_OUTPATIENT_CLINIC_OR_DEPARTMENT_OTHER): Payer: Self-pay | Admitting: Unknown Physician Specialty

## 2016-09-24 DIAGNOSIS — J309 Allergic rhinitis, unspecified: Secondary | ICD-10-CM

## 2016-09-25 MED ORDER — LORATADINE 10 MG OR TABS
ORAL_TABLET | ORAL | 11 refills | Status: DC
Start: 2016-09-25 — End: 2017-10-28

## 2016-09-30 ENCOUNTER — Other Ambulatory Visit (HOSPITAL_BASED_OUTPATIENT_CLINIC_OR_DEPARTMENT_OTHER): Payer: Self-pay | Admitting: Unknown Physician Specialty

## 2016-09-30 ENCOUNTER — Ambulatory Visit (HOSPITAL_BASED_OUTPATIENT_CLINIC_OR_DEPARTMENT_OTHER)
Admit: 2016-09-30 | Discharge: 2016-09-30 | Disposition: A | Discharge status: Home / Self Care | Payer: No Typology Code available for payment source | Attending: Internal Medicine | Admitting: Internal Medicine

## 2016-09-30 DIAGNOSIS — E538 Deficiency of other specified B group vitamins: Secondary | ICD-10-CM | POA: Insufficient documentation

## 2016-09-30 DIAGNOSIS — D72819 Decreased white blood cell count, unspecified: Secondary | ICD-10-CM | POA: Insufficient documentation

## 2016-09-30 LAB — CBC, DIFF
% Basophils: 1 %
% Eosinophils: 2 %
% Immature Granulocytes: 0 %
% Lymphocytes: 38 %
% Monocytes: 14 %
% Neutrophils: 45 %
% Nucleated RBC: 0 %
Absolute Eosinophil Count: 0.07 10*3/uL (ref 0.00–0.50)
Absolute Lymphocyte Count: 1.33 10*3/uL (ref 1.00–4.80)
Basophils: 0.02 10*3/uL (ref 0.00–0.20)
Hematocrit: 41 % (ref 38–50)
Hemoglobin: 13.6 g/dL (ref 13.0–18.0)
Immature Granulocytes: 0.01 10*3/uL (ref 0.00–0.05)
MCH: 29.8 pg (ref 27.3–33.6)
MCHC: 33.4 g/dL (ref 32.2–36.5)
MCV: 89 fL (ref 81–98)
Monocytes: 0.48 10*3/uL (ref 0.00–0.80)
Neutrophils: 1.57 10*3/uL — ABNORMAL LOW (ref 1.80–7.00)
Nucleated RBC: 0 10*3/uL
Platelet Count: 190 10*3/uL (ref 150–400)
RBC: 4.56 10*6/uL (ref 4.40–5.60)
RDW-CV: 13.5 % (ref 11.6–14.4)
WBC: 3.48 10*3/uL — ABNORMAL LOW (ref 4.30–10.00)

## 2016-09-30 LAB — FOLATE & VITAMIN B12
Folate, SRM: 10.4 ng/mL (ref >5.8)
Vitamin B12 (Cobalamin): 449 pg/mL (ref 180–914)

## 2016-10-02 ENCOUNTER — Ambulatory Visit (HOSPITAL_BASED_OUTPATIENT_CLINIC_OR_DEPARTMENT_OTHER): Payer: No Typology Code available for payment source | Attending: Internal Medicine | Admitting: Internal Medicine

## 2016-10-02 ENCOUNTER — Encounter (HOSPITAL_BASED_OUTPATIENT_CLINIC_OR_DEPARTMENT_OTHER): Payer: Self-pay | Admitting: Internal Medicine

## 2016-10-02 VITALS — BP 130/64 | HR 88 | Temp 98.1°F | Resp 16 | Ht 64.0 in | Wt 189.4 lb

## 2016-10-02 DIAGNOSIS — Z6832 Body mass index (BMI) 32.0-32.9, adult: Secondary | ICD-10-CM

## 2016-10-02 DIAGNOSIS — R197 Diarrhea, unspecified: Secondary | ICD-10-CM | POA: Insufficient documentation

## 2016-10-02 DIAGNOSIS — L219 Seborrheic dermatitis, unspecified: Secondary | ICD-10-CM | POA: Insufficient documentation

## 2016-10-02 MED ORDER — KETOCONAZOLE 2 % EX CREA
TOPICAL_CREAM | Freq: Every day | CUTANEOUS | 2 refills | Status: DC
Start: 2016-10-02 — End: 2019-02-15

## 2016-10-02 NOTE — Patient Instructions (Signed)
Today we talked about some signs of inflammation or thickening in your bowels, we have referred you to gastroenterology to consider a colonoscopy to look at your bowels    For your peeling skin on your face, we have prescribed a cream.

## 2016-10-02 NOTE — Progress Notes (Addendum)
Justin Zavala - RETURN PATIENT VISIT     DATE: 10/02/2016     ID/CC: [name] is a 51 year old man who presents for an acute visit.    HISTORY OF PRESENT ILLNESS/ INTERVAL EVENTS:    [name] is normally seen by Dr. Bobbye Zavala.  [name] has a history of developmental delay, schizophrenia, GERD, hypertension and type 2 diabetes.  He is accompanied by his case manager Justin Zavala at most visits,    He was last seen by his PCP at the end of May.    Today Justin Zavala says he has been eating a lot of sweets. He has gained 7 pounds since last visit.     Diarrhea:  Continues to have loose stools he says daily, he says about 2 per day.  In total this has now been going on for over a year.  His case manager who is with him today, Justin Zavala, states that this happened as recently as yesterday where he had gotten up from her chair and noted to have soiled his hands.  The patient is unable to provide a great deal detail, but is able to say there are times when he feels that he needs to have a bowel movement and is unable to make it to the bathroom.  He underwent a CT of his abdomen after seeing [place] in Zavala last visit to further investigate this.  The CT did show some bowel wall thickening that could be consistent with inflammation from autoimmune or infectious etiologies.  Justin Zavala has never had a colonoscopy.     MEDICATIONS:  Current Outpatient Prescriptions   Medication Sig Dispense Refill   . Albuterol Sulfate HFA 108 (90 Base) MCG/ACT Inhalation Aero Soln Inhale 2 puffs by mouth every 6 hours as needed. For shortness of breath. 1 Inhaler 6   . Clotrimazole 1 % External Cream Apply 1 application topically 2 times a day. Apply to feet for fungal rash. 45 g 3   . Cyanocobalamin 1000 MCG Oral Tab Take 1 tablet (1,000 mcg) by mouth daily. 30 tablet 6   . Divalproex Sodium 250 MG Oral Tab EC Take 750 mg by mouth at bedtime.     [name] HydroCHLOROthiazide 25 MG Oral Tab TAKE 1 TABLET BY MOUTH DAILY 28 tablet 1   . Ketoconazole 2 % External  Cream Apply to affected area on face daily. 30 g 2   . Lisinopril 40 MG Oral Tab TAKE 1 TABLET BY MOUTH DAILY (40 MG) 28 tablet 1   . Loratadine 10 MG Oral Tab TAKE 1 TABLET BY MOUTH DAILY 28 tablet 11   . MetFORMIN HCl 500 MG Oral Tab TAKE 2 TABLETS (1000MG ) BY MOUTH TWICE A DAY 112 tablet 1   . Metoprolol Succinate ER 25 MG Oral TABLET SR 24 HR Take 0.5 tablets (12.5 mg) by mouth daily. 45 tablet 0   . Paliperidone Palmitate 234 MG/1.5ML Intramuscular Suspension Inject 234 mg intramuscularly every 4 weeks.     . Pravastatin Sodium 40 MG Oral Tab TAKE 1 TABLET BY MOUTH EVERY EVENING 28 tablet 5   . RaNITidine HCl 150 MG Oral Tab TAKE 1 TABLET BY MOUTH TWICE A DAY 56 tablet 1     No current facility-administered medications for this visit.        PHYSICAL EXAM:  BP 130/64   Pulse 88   Temp 98.1 F (36.7 C) (Temporal)   Resp 16   Ht 5\' 4"  (1.626 m)   Wt 189 lb  6.4 oz (85.9 kg)   BMI 32.51 kg/m    Wt Readings from Last 3 Encounters:   10/02/16 189 lb 6.4 oz (85.9 kg)   08/27/16 182 lb 3.2 oz (82.6 kg)   06/05/16 195 lb (88.5 kg)      BP Readings from Last 3 Encounters:   10/02/16 130/64   08/27/16 132/54   06/05/16 143/56     GEN: well appearing man in NAD  ABD: +BS, non-distended, non-tender  SKIN: flaking skin at cheeks and eyebrows    RELEVANT LABS/STUDIES  Results for orders placed or performed in visit on 09/30/16   CBC, DIFF   Result Value Ref Range    WBC 3.48 (L) 4.30 - 10.00 10*3/uL    RBC 4.56 4.40 - 5.60 10*6/uL    Hemoglobin 13.6 13.0 - 18.0 g/dL    Hematocrit 41 38 - 50 %    MCV 89 81 - 98 fL    MCH 29.8 27.3 - 33.6 pg    MCHC 33.4 32.2 - 36.5 g/dL    Platelet Count 190 150 - 400 10*3/uL    RDW-CV 13.5 11.6 - 14.4 %    % Neutrophils 45 %    % Lymphocytes 38 %    % Monocytes 14 %    % Eosinophils 2 %    % Basophils 1 %    % Immature Granulocytes 0 %    Neutrophils 1.57 (L) 1.80 - 7.00 10*3/uL    Absolute Lymphocyte Count 1.33 1.00 - 4.80 10*3/uL    Monocytes 0.48 0.00 - 0.80 10*3/uL    Absolute  Eosinophil Count 0.07 0.00 - 0.50 10*3/uL    Basophils 0.02 0.00 - 0.20 10*3/uL    Immature Granulocytes 0.01 0.00 - 0.05 10*3/uL    Nucleated RBC 0.00 0.00 10*3/uL    % Nucleated RBC 0 %   FOLATE & VITAMIN B12   Result Value Ref Range    Folate, SRM 10.4 >5.8 ng/mL    Vitamin B12 (Cobalamin) 449 180 - 914 pg/mL         ASSESSMENT/PLAN:   [name] is a 51 year old man who presents for an acute visit.    #Diarrhea  Unclear etiology of this, with feature of incontinence.  Given his 1 year duration and concern for potential inflammatory changes potentially suggestive of inflammatory etiology for his disease reasonable to refer for colonoscopy at this time.  -Referral to GI for colonoscopy    #Seborrheic dermatitis  Apparent at the patient's cheeks and eyebrows, not undergoing any treatment at this time.  -Fluconazole 2% cream daily    Justin Zavala, case manager: [phone]    Follow-up: With Dr. Bobbye Zavala in 3 months  discussed with: Justin Zavala     Justin Holding, MD  Medicine R2

## 2016-10-11 NOTE — Progress Notes (Signed)
Attending Statement     I have personally discussed the case with the resident during or immediately after the patient visit including review of history, physical exam, diagnosis, and treatment plan. I agree with the assessment and plan of care.     Shequilla Goodgame W. Consepcion Utt, MD, MPH   Attending Physician

## 2016-10-17 ENCOUNTER — Ambulatory Visit (HOSPITAL_BASED_OUTPATIENT_CLINIC_OR_DEPARTMENT_OTHER): Payer: No Typology Code available for payment source | Attending: Physician Assistant | Admitting: Physician Assistant

## 2016-10-17 VITALS — BP 143/55 | HR 76 | Temp 97.2°F | Resp 16 | Wt 187.2 lb

## 2016-10-17 DIAGNOSIS — R197 Diarrhea, unspecified: Secondary | ICD-10-CM | POA: Insufficient documentation

## 2016-10-17 LAB — C_REACTIVE PROTEIN: C_Reactive Protein: 41.6 mg/L — ABNORMAL HIGH (ref 0.0–10.0)

## 2016-10-17 MED ORDER — LOPERAMIDE HCL 2 MG OR CAPS
ORAL_CAPSULE | ORAL | 3 refills | Status: DC
Start: 2016-10-17 — End: 2019-02-15

## 2016-10-17 NOTE — Patient Instructions (Signed)
1. Go to the lab to have your blood drawn in the next few days.   2. We will contact your case manager to set up an appointment to have a procedure to look for the cause of your diarrhea.     Please do not hesitate to contact the [facility] clinic should you have any additional questions or concerns. Our phone number is [phone], option 8. You can also contact us via e-care message.     Kind regards,    Delrae Sawyers PA-C  Physician Assistant   Gastroenterology  [facility]  [address] [place] [address]  [email or web address]

## 2016-10-17 NOTE — Progress Notes (Signed)
CHIEF COMPLAINT: Diarrhea     HPI:   Justin Zavala is a 51 year old male who presents to the outpatient Gastroenterology clinic for diarrhea    Patient was referred to [facility] for evaluation of diarrhea with PMH of developmental delay and schizophrenia. Symptoms have been going on for up to 1 year and have been associated with incontinence. CT scan performed June 2018 showed moderate circumferential thickening of jejunum and fluid-filled portions of ileum.     At today's visit, patient presents alone. Is not accompanied by case manager or family. History is somewhat difficult to obtain.   - According to patient he's had diarrhea for 1 month. On further questioning he states it could be longer. He will have a few bouts of diarrhea, often with incontinence, then the diarrhea will improve on its own. Usually only happens after eating. Will have normal formed stools in between episodes of diarrhea. States he had a formed stool today and yesterday. Denies blood in stool or nighttime symptoms. Hasn't taken any medication for the diarrhea.     Denies abdominal pain, N/V, weight loss, blood in stool, melena, sick contacts, recent use of antibiotics, prior abdominal surgeries    GI History  Use of NSAID's: NO  GI PMH: NEGATIVE - cholecystitis, cholelithiasis, choledocholithiasis, previous documented ulcer or H. Pylori, abdominal surgeries, IBS, IBD   Bowel habits: varies - episodes of self-limiting diarrhea with incontinence, formed stools other times  Diet: did not assess in detail   Alcohol consumption:  NO  Tobacco: NO  Marijuana: NO  Illicit substances: NO   Family history of GI malignancies: NO  Recent labs:   - 09/30/16 CBC - normal hg/hct/MCV  - 08/30/16 CBC - hg 12.1, hct 36, MCV 87; CMP - mildly low K, mildly high bilirubin; ferritin/iron studies normal   Endoscopies: N/A  Imaging: 09/19/16 CT A/P - moderate circumferential thickening of the jejunum and fluid-filled portions of the ileum, all  consistent with enteritis (chronic or acute on chronic) or transmural infiltration by another entity such as autoimmune or inflammatory disease, low-grade lymphoma such as MALT is not excluded    OTHER MEDICAL HISTORY    MEDICATIONS:  Current Outpatient Prescriptions   Medication Sig Dispense Refill   . Albuterol Sulfate HFA 108 (90 Base) MCG/ACT Inhalation Aero Soln Inhale 2 puffs by mouth every 6 hours as needed. For shortness of breath. 1 Inhaler 6   . Clotrimazole 1 % External Cream Apply 1 application topically 2 times a day. Apply to feet for fungal rash. 45 g 3   . Cyanocobalamin 1000 MCG Oral Tab Take 1 tablet (1,000 mcg) by mouth daily. 30 tablet 6   . Divalproex Sodium 250 MG Oral Tab EC Take 750 mg by mouth at bedtime.     [name] HydroCHLOROthiazide 25 MG Oral Tab TAKE 1 TABLET BY MOUTH DAILY 28 tablet 1   . Ketoconazole 2 % External Cream Apply to affected area on face daily. 30 g 2   . Lisinopril 40 MG Oral Tab TAKE 1 TABLET BY MOUTH DAILY (40 MG) 28 tablet 1   . Loperamide HCl 2 MG Oral Cap Take 2 capsules by mouth initially, then take 1 capsule after subsequent loose stools.  No more than 8 capsules per day. 30 capsule 3   . Loratadine 10 MG Oral Tab TAKE 1 TABLET BY MOUTH DAILY 28 tablet 11   . MetFORMIN HCl 500 MG Oral Tab TAKE 2 TABLETS (1000MG ) BY MOUTH TWICE A  DAY 112 tablet 1   . Metoprolol Succinate ER 25 MG Oral TABLET SR 24 HR Take 0.5 tablets (12.5 mg) by mouth daily. 45 tablet 0   . Paliperidone Palmitate 234 MG/1.5ML Intramuscular Suspension Inject 234 mg intramuscularly every 4 weeks.     . Pravastatin Sodium 40 MG Oral Tab TAKE 1 TABLET BY MOUTH EVERY EVENING 28 tablet 5   . RaNITidine HCl 150 MG Oral Tab TAKE 1 TABLET BY MOUTH TWICE A DAY 56 tablet 1     No current facility-administered medications for this visit.        ALLERGIES:  Review of patient's allergies indicates:  No Known Allergies    PAST MEDICAL AND SURGICAL HISTORY:  Past Medical History:   Diagnosis Date   . Allergic rhinitis,  cause unspecified    . Chronic obstructive asthma, unspecified ([place])    . Diabetes mellitus ([place])    . Lipidoses    . Obesity, unspecified    . 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 Other specified types of schizophrenia, unspecified condition ([place])    . Unspecified essential hypertension      No past surgical history on file.    SOCIAL HISTORY:  Tobacco:   History   Smoking Status   . Never Smoker   Smokeless Tobacco   . Never Used     EtOH:   History   Alcohol Use No       FAMILY HISTORY:   Family History     Problem (# of Occurrences) Relation (Name,Age of Onset)    Cancer (1) Mother    Diabetes (1) Mother    Mental Illness (1) Sister    Stroke (1) Mother: 2012 stroke          ROS: Positives and pertinent negatives per interval history; otherwise see below.  Review of Systems   Constitutional: Positive for fatigue.   Gastrointestinal: Positive for diarrhea.   Psychiatric/Behavioral: Positive for dysphoric mood.   All other systems reviewed and are negative.    PHYSICAL EXAM:   CONSTITUTIONAL:   Height: (no height taken for this visit)     Weight: Wt 187 lb 2.7 oz (84.9 kg)  Vital signs:  BP 143/55   Pulse 76   Temp 97.2 F (36.2 C) (Temporal)   Resp 16   Wt 187 lb 2.7 oz (84.9 kg)   BMI 32.13 kg/m   Physical Exam   Constitutional: He appears well-developed and well-nourished.   HENT:   Head: Normocephalic and atraumatic.   Eyes: Conjunctivae are normal.   Pulmonary/Chest: Effort normal.   Musculoskeletal: Normal range of motion.   Neurological: He is alert.   Skin: No rash noted.   Psychiatric: He has a normal mood and affect.   Vitals reviewed.    LABORATORY DATA:  Orders Only on 09/30/2016   Component Date Value   . WBC 09/30/2016 3.48*   . RBC 09/30/2016 4.56    . Hemoglobin 09/30/2016 13.6    . Hematocrit 09/30/2016 41    . MCV 09/30/2016 89    . [facility] (1-Rh) 09/30/2016 29.8    . MCHC 09/30/2016 33.4    . Platelet Count 09/30/2016 190    . RDW-CV 09/30/2016 13.5    . % Neutrophils 09/30/2016 45    . %  Lymphocytes 09/30/2016 38    . % Monocytes 09/30/2016 14    . % Eosinophils 09/30/2016 2    . % Basophils 09/30/2016 1    . %  Immature Granulocytes 09/30/2016 0    . Neutrophils 09/30/2016 1.57*   . Absolute Lymphocyte Count 09/30/2016 1.33    . Monocytes 09/30/2016 0.48    . Absolute Eosinophil Count 09/30/2016 0.07    . Basophils 09/30/2016 0.02    . Immature Granulocytes 09/30/2016 0.01    . Nucleated RBC 09/30/2016 0.00    . % Nucleated RBC 09/30/2016 0    . Folate, SRM 09/30/2016 10.4    . Vitamin B12 (Cobalamin) 09/30/2016 449    Orders Only on 09/19/2016   Component Date Value   . Creatinine by i_Stat (PO* 09/19/2016 1.0    . Spec Desc I_Stat (POC) C* 09/19/2016 None    . Performing Lab i_STAT (P* 09/19/2016 Test performed by: [facility], [place], [place] [address]    Orders Only on 08/30/2016   Component Date Value   . Lab Test Requested 08/30/2016 reticulocyte count    . Specimen Type/Description 08/30/2016 Blood    . Sample To Use 08/30/2016 Most recent    . Test Request Status 08/30/2016 Order Processed    Orders Only on 08/30/2016   Component Date Value   . Lab Test Requested 08/30/2016 LDH, haptoglobin, direct and indirect bilirubin, smear scan (Evan WBC and RBC abnormalities)    . Specimen Type/Description 08/30/2016 Blood    . Sample To Use 08/30/2016 Most recent    . Test Request Status 08/30/2016 Order Processed    Office Visit on 08/27/2016   Component Date Value   . TSH with Reflexive Free * 08/30/2016 1.973    . WBC 08/30/2016 2.63*   . RBC 08/30/2016 4.17*   . Hemoglobin 08/30/2016 12.1*   . Hematocrit 08/30/2016 36*   . MCV 08/30/2016 87    . [facility] 08/30/2016 29.0    . MCHC 08/30/2016 33.5    . Platelet Count 08/30/2016 175    . RDW-CV 08/30/2016 14.3    . % Neutrophils 08/30/2016 53    . % Lymphocytes 08/30/2016 30    . % Monocytes 08/30/2016 15    . % Eosinophils 08/30/2016 2    . % Basophils 08/30/2016 0    . % Immature Granulocytes 08/30/2016 0    . Neutrophils  08/30/2016 1.40*   . Absolute Lymphocyte Count 08/30/2016 0.78*   . Monocytes 08/30/2016 0.39    . Absolute Eosinophil Count 08/30/2016 0.05    . Basophils 08/30/2016 0.01    . Immature Granulocytes 08/30/2016 0.00    . Nucleated RBC 08/30/2016 0.00    . % Nucleated RBC 08/30/2016 0    . Creatinine/Unit, URN 08/30/2016 264    . Albumin (Micro), URN 08/30/2016 2.28    . Albumin/Creatinine Ratio* 08/30/2016 9    . Hepatitis B S Ag w/Rflx * 08/30/2016 Nonreactive    . Hepatitis B Surface Ab 08/30/2016 Nonreactive    . Hepatitis B Surf Antibod* 08/30/2016 <8.00    . Hepatitis B Core Ab 08/30/2016 Nonreactive    . Hepatitis B Battery Inte* 08/30/2016 No evidence of past or present Hepatitis B infection.    . Hemoglobin A1C 08/30/2016 5.9    . Folate, SRM 08/30/2016 11.7    . Vitamin B12 (Cobalamin) 08/30/2016 176*   . Sodium 08/30/2016 137    . Potassium 08/30/2016 3.3*   . Chloride 08/30/2016 101    . Carbon Dioxide, Total 08/30/2016 28    . Anion Gap 08/30/2016 8    . Glucose 08/30/2016 122    . Urea Nitrogen 08/30/2016  10    . Creatinine 08/30/2016 0.94    . Protein (Total) 08/30/2016 6.8    . Albumin 08/30/2016 4.3    . Bilirubin (Total) 08/30/2016 1.5*   . Calcium 08/30/2016 9.7    . AST (GOT) 08/30/2016 27    . Alkaline Phosphatase (To* 08/30/2016 102    . ALT (GPT) 08/30/2016 43    . GFR, Calc, European Amer* 08/30/2016 >60    . GFR, Calc, African Ameri* 08/30/2016 >60    . GFR, Information 08/30/2016 Calculated GFR in mL/min/1.73 m2 by MDRD equation.  Inaccurate with changing renal function.  See [email or web address]    . Ferritin 08/30/2016 56    . Transferrin 08/30/2016 257    . Iron, SRM 08/30/2016 64    . Total Iron Binding Capac* 08/30/2016 360    . Transferrin Saturation 08/30/2016 18    . Bilirubin (Direct) 08/30/2016 0.3    . Haptoglobin (Quant) 08/30/2016 136    . Lactate Dehydrogenase 08/30/2016 197    . Smear Scan 08/30/2016 No significant WBC morphological  abnormalities.    . Reticulocyte Count, % 08/30/2016 2.1*   . Absolute Reticulocyte Co* 08/30/2016 87    . RETIC HEMOGLOBIN EQUIVAL* 08/30/2016 35.8        RADIOLOGIC STUDIES:  09/19/16 CT A/P  IMPRESSION:  Sections of moderate circumferential thickening of the jejunum and fluid-filled portions of the ileum, all consistent with enteritis (chronic or acute on chronic) or transmural infiltration by another entity such as autoimmune or inflammatory disease. Findings are not typical for lymphoma however low-grade lymphoma such as MALT is not excluded. No neoplasm elsewhere. No other findings to explain clinical symptoms.    ENDOSCOPIES:  N/A    IMPRESSION:   Patient is a 51 yo male with developmental delay, schizophrenia and T2DM here for evaluation of diarrhea. Symptoms have been going on for up to 1 year and have been associated with incontinence. CT scan performed June 2018 showed moderate circumferential thickening of jejunum and fluid-filled portions of ileum. Other than this, no other work-up has been initiated with stool studies.     Patient is a poor historian but states that the diarrhea comes in waves and can be unpredictable but usually always after food. He hasn't had any diarrhea for the past 2-3 days. Cannot remember the last time he had it. Would normally recommend stool studies to evaluate for infection, but I suspect that those would be difficult for this patient and seem unlikely given episodic nature of diarrhea. Will recommend colonoscopy to evaluate for possible microscopic colitis or other mucosal abnormalities. Will also recommend EGD given history of vitamin B12 deficiency with biopsies to rule out Celiac disease. If both EGD/colonoscopy normal and do not reveal cause of diarrhea, could consider MRE/CTE to follow-up on abnormal CT A/P. In the meantime, will give patient prescription for loperamide to take on an as needed basis if diarrhea becomes severe.     Discussed in detail with patient about  the reason for endoscopy and risk involved which include but are not limited to: perforation, bleeding, missed lesions, and complications from sedation.  Pt agreed to proceed with this.    Differential diagnoses considered include infectious diarrhea (C diff, giardia/parasites, other causes), dietary, 2/2 medication, microscopic colitis, IBS, IBD, Celiac disease     RECOMMENDATIONS:   The encounter diagnosis was Diarrhea, unspecified type.    Based on this we formulated the following plan:    1. Recommend labs today including CRP  2. Recommend  evaluation with EGD/colonoscopy with anesthesia   3. Recommend treatment with loperamide as needed for diarrhea   4. Will review EGD/colonoscopy results and make plan on next steps

## 2016-10-18 ENCOUNTER — Other Ambulatory Visit (HOSPITAL_BASED_OUTPATIENT_CLINIC_OR_DEPARTMENT_OTHER): Payer: Self-pay | Admitting: Unknown Physician Specialty

## 2016-10-18 DIAGNOSIS — E118 Type 2 diabetes mellitus with unspecified complications: Secondary | ICD-10-CM

## 2016-10-18 DIAGNOSIS — I10 Essential (primary) hypertension: Secondary | ICD-10-CM

## 2016-10-18 DIAGNOSIS — K219 Gastro-esophageal reflux disease without esophagitis: Secondary | ICD-10-CM

## 2016-10-21 MED ORDER — METFORMIN HCL 500 MG OR TABS
ORAL_TABLET | ORAL | 5 refills | Status: DC
Start: 2016-10-21 — End: 2017-06-20

## 2016-10-21 MED ORDER — LISINOPRIL 40 MG OR TABS
ORAL_TABLET | ORAL | 5 refills | Status: DC
Start: 2016-10-21 — End: 2017-06-20

## 2016-10-21 MED ORDER — METOPROLOL SUCCINATE ER 25 MG OR TB24
EXTENDED_RELEASE_TABLET | ORAL | 2 refills | Status: DC
Start: 2016-10-21 — End: 2017-02-03

## 2016-10-21 MED ORDER — RANITIDINE HCL 150 MG OR TABS
ORAL_TABLET | ORAL | 5 refills | Status: DC
Start: 2016-10-21 — End: 2017-06-20

## 2016-10-21 NOTE — Telephone Encounter (Signed)
Rx for HCTZ 25mg  daily requires provider's approval. Last lab work on 08/30/16      Potassium 3.3   3.6 - 5.2 meq/L

## 2016-10-22 ENCOUNTER — Encounter (HOSPITAL_BASED_OUTPATIENT_CLINIC_OR_DEPARTMENT_OTHER): Payer: Self-pay | Admitting: Physician Assistant

## 2016-10-28 MED ORDER — POTASSIUM CHLORIDE CRYS ER 20 MEQ OR TBCR
20.0000 meq | EXTENDED_RELEASE_TABLET | Freq: Every day | ORAL | 6 refills | Status: DC
Start: 2016-10-28 — End: 2017-06-20

## 2016-10-28 MED ORDER — HYDROCHLOROTHIAZIDE 25 MG OR TABS
ORAL_TABLET | ORAL | 6 refills | Status: DC
Start: 2016-10-28 — End: 2017-06-20

## 2016-10-28 NOTE — Addendum Note (Signed)
Addended by: Bubba Camp on: 10/28/2016 09:05 AM     Modules accepted: Orders

## 2016-11-01 ENCOUNTER — Ambulatory Visit (HOSPITAL_BASED_OUTPATIENT_CLINIC_OR_DEPARTMENT_OTHER): Payer: No Typology Code available for payment source | Admitting: Physician Assistant

## 2016-11-21 ENCOUNTER — Telehealth (HOSPITAL_BASED_OUTPATIENT_CLINIC_OR_DEPARTMENT_OTHER): Payer: Self-pay

## 2016-11-21 NOTE — Telephone Encounter (Signed)
Called pt Case Manager [name] to schedule. He's out of town until 11/25/2016 asked him to please call me to schedule pt Pre Anes clinic visit prior to scheduling pt for procedure - routing sheet from [place] is in Pre Anes folder     CCR: PLEASE WARM TRANSFER PATIENT TO [place] [facility] [phone] OR IF NOT AVAILABLE, OK TO WARM TRANSFER TO [place] [phone]

## 2016-12-06 ENCOUNTER — Telehealth (HOSPITAL_BASED_OUTPATIENT_CLINIC_OR_DEPARTMENT_OTHER): Payer: Self-pay

## 2016-12-06 NOTE — Telephone Encounter (Signed)
Called Case Manager [name] and left a VM for him to call me back. Advised we would be closed on Monday Sept 3rd for the holiday, however will be back on Tuesday    CCR: PLEASE WARM TRANSFER PATIENT TO [place] OR IF NOT AVAILABLE, OK TO WARM TRANSFER TO [place] [phone]

## 2016-12-16 ENCOUNTER — Other Ambulatory Visit (HOSPITAL_BASED_OUTPATIENT_CLINIC_OR_DEPARTMENT_OTHER): Payer: Self-pay | Admitting: Unknown Physician Specialty

## 2016-12-16 DIAGNOSIS — E785 Hyperlipidemia, unspecified: Secondary | ICD-10-CM

## 2016-12-17 MED ORDER — PRAVASTATIN SODIUM 40 MG OR TABS
40.0000 mg | ORAL_TABLET | Freq: Every evening | ORAL | 2 refills | Status: DC
Start: 2016-12-17 — End: 2017-02-03

## 2016-12-26 ENCOUNTER — Ambulatory Visit (HOSPITAL_BASED_OUTPATIENT_CLINIC_OR_DEPARTMENT_OTHER): Payer: No Typology Code available for payment source

## 2017-01-06 ENCOUNTER — Encounter (HOSPITAL_BASED_OUTPATIENT_CLINIC_OR_DEPARTMENT_OTHER): Payer: No Typology Code available for payment source | Admitting: Unknown Physician Specialty

## 2017-01-06 NOTE — Progress Notes (Deleted)
Adult Medicine Clinic- Return Visit    Date: 01/06/2017    ID/CC:        Subjective/Interval History:    Pre-anesthesia appt last week   -unsure of when GI procedure will be     last labs showed:   Improved B12 (400s from 170s)  CBC with WBC 3.48, improved Hct 41 (36)    Planning on a colonoscopy       Objective:  There were no vitals taken for this visit.     Wt Readings from Last 3 Encounters:   10/17/16 187 lb 2.7 oz (84.9 kg)   10/02/16 189 lb 6.4 oz (85.9 kg)   08/27/16 182 lb 3.2 oz (82.6 kg)      BP Readings from Last 3 Encounters:   10/17/16 143/55   10/02/16 130/64   08/27/16 132/54       Gen:  Pulm:       Results for orders placed or performed in visit on 10/17/16   CRP, HIGH SENSITIVITY   Result Value Ref Range    C_Reactive Protein 41.6 (H) 0.0 - 10.0 mg/L         Assessment/Plan:  There are no diagnoses linked to this encounter.        Follow-up: ***    Discussed with: ***

## 2017-01-10 ENCOUNTER — Telehealth (HOSPITAL_BASED_OUTPATIENT_CLINIC_OR_DEPARTMENT_OTHER): Payer: Self-pay

## 2017-01-10 NOTE — Telephone Encounter (Signed)
SITUATION:  Pt completed Pre Anes and is ready to schedule     BACKGROUND:  3 attempts have been made to contact both Case Manager and patient     ASSESSMENT:  Pt ready to schedule per VM received by Case manager [name]     REQUEST/RECOMMENDATION:  Called pt Case Manager, advised I'd be in a meeting until 10:00am and for him to call either my phone directly, or the general [place] line to schedule     CCR: PLEASE WARM TRANSFER PATIENT TO [facility] [phone] OR IF NOT AVAILABLE, OK TO WARM TRANSFER TO [place] [phone]

## 2017-01-13 ENCOUNTER — Telehealth (HOSPITAL_BASED_OUTPATIENT_CLINIC_OR_DEPARTMENT_OTHER): Payer: Self-pay | Admitting: Gastroenterology

## 2017-01-13 DIAGNOSIS — E538 Deficiency of other specified B group vitamins: Secondary | ICD-10-CM

## 2017-01-13 DIAGNOSIS — R197 Diarrhea, unspecified: Secondary | ICD-10-CM

## 2017-01-13 MED ORDER — PEG 3350-KCL-NABCB-NACL-NASULF 236 G OR SOLR
ORAL | 0 refills | Status: DC
Start: 2017-01-13 — End: 2018-12-24

## 2017-01-13 NOTE — Telephone Encounter (Signed)
Scheduled patient for GI procedure    Are you taking Blood Thinner? NO  Are you taking Diabetes RX? YES - WILL SPEAK TO PCP REGARDING MANAGEMENT - METFORMIN   Are you aware you will need an adult friend or family member to escort you from this procedure? YES- SISTER AND CASE MANAGER WILL BE WITH PT    Where is you preferred pharmacy? [facility]  [email or web address]  [phone]

## 2017-02-03 ENCOUNTER — Other Ambulatory Visit (HOSPITAL_BASED_OUTPATIENT_CLINIC_OR_DEPARTMENT_OTHER): Payer: Self-pay | Admitting: Unknown Physician Specialty

## 2017-02-03 DIAGNOSIS — I10 Essential (primary) hypertension: Secondary | ICD-10-CM

## 2017-02-03 DIAGNOSIS — E785 Hyperlipidemia, unspecified: Secondary | ICD-10-CM

## 2017-02-04 MED ORDER — METOPROLOL SUCCINATE ER 25 MG OR TB24
12.5000 mg | EXTENDED_RELEASE_TABLET | Freq: Every day | ORAL | 0 refills | Status: DC
Start: 2017-02-04 — End: 2017-06-03

## 2017-02-04 MED ORDER — PRAVASTATIN SODIUM 40 MG OR TABS
40.0000 mg | ORAL_TABLET | Freq: Every evening | ORAL | 0 refills | Status: DC
Start: 2017-02-04 — End: 2017-12-31

## 2017-02-04 NOTE — Telephone Encounter (Signed)
Patient last seen on 10/02/16 and was to return in 3 months. One refill authorized.  Please schedule follow up visit

## 2017-02-24 ENCOUNTER — Ambulatory Visit (HOSPITAL_BASED_OUTPATIENT_CLINIC_OR_DEPARTMENT_OTHER): Payer: No Typology Code available for payment source | Admitting: Gastroenterology

## 2017-02-24 ENCOUNTER — Ambulatory Visit (HOSPITAL_COMMUNITY)
Admission: RE | Admit: 2017-02-24 | Payer: No Typology Code available for payment source | Source: Home / Self Care | Admitting: Gastroenterology

## 2017-05-20 ENCOUNTER — Telehealth (HOSPITAL_BASED_OUTPATIENT_CLINIC_OR_DEPARTMENT_OTHER): Payer: Self-pay | Admitting: Unknown Physician Specialty

## 2017-05-20 ENCOUNTER — Encounter (HOSPITAL_BASED_OUTPATIENT_CLINIC_OR_DEPARTMENT_OTHER): Payer: No Typology Code available for payment source | Admitting: Unknown Physician Specialty

## 2017-05-20 NOTE — Progress Notes (Deleted)
Adult Medicine Clinic- Return Visit    Date: 05/20/2017    ID/CC:        Subjective/Interval History:  I last saw Justin Zavala [id number].  In the interim, we had several visits planned, but Justin Zavala did not present for these appointments.  He did, however, see another provider in [facility] last in June 2018 as well as appts in GI.     In brief, at our last visit, pt was having some GI sx, and his case manager and [place] provider were both worried that he seemed more fatigued, having some incontinence and weight loss.  Labs were notable for ***.  CT abdomen was obtained which showed ***.  Colonoscopy and GI referrals were ordered.    Patient saw GI ***.  At that time, they agreed with EGD (given B12 def) and colon to help evaluate.  They would like stool studies as well, but did not think these would be feasible for patient to obtain.  They also obtained a CRP which was elevated at 41.     Since that time, it has been difficult for patient to get scheduled for these studies.  A number of attempts to schedule have been made w/ patient's CM.  Procedures were scheduled, but then cancelled in November.      [ ]  stop metop   [ ]  CBC, B12, CMP         Objective:  There were no vitals taken for this visit.     Wt Readings from Last 3 Encounters:   10/17/16 187 lb 2.7 oz (84.9 kg)   10/02/16 189 lb 6.4 oz (85.9 kg)   08/27/16 182 lb 3.2 oz (82.6 kg)      BP Readings from Last 3 Encounters:   10/17/16 143/55   10/02/16 130/64   08/27/16 132/54       Gen:  Pulm:       Results for orders placed or performed in visit on 10/17/16   CRP, HIGH SENSITIVITY   Result Value Ref Range    C_Reactive Protein 41.6 (H) 0.0 - 10.0 mg/L         Assessment/Plan:  There are no diagnoses linked to this encounter.        Follow-up: ***    Discussed with: ***

## 2017-05-20 NOTE — Telephone Encounter (Signed)
Spoke with patient's case manager, [name], today via phone.  Mr. Wombles was scheduled to see me in clinic today, but was unable to come due to snow.  [name] will help Zorawar reschedule his appointment when he sees him later this week.     I asked about the previously scheduled colonoscopy/EGD that was cancelled in November.  [name] reports that the procedure was cancelled because his sister's daughter was sick, and she was unable to help him with the prep.  [name] is wondering if there are other options to arrange to colon/egd (is inpatient prep a possibility given his mental health and developmental delay?).  I will reach out to GI to discuss what possibilities exist.  Since it is so long since his initial GI eval, he may need to see them again in clinic.      Per case manager, his mood is good and Hilbert continues to follow regularly at [facility].  Sees Luellen Pucker for his psychiatric meds and care -- last saw him in December at which time things were fairly stable. Sees his CM tuesdays at [facility], and they generally check in on Thursdays as well.     [name] reports that his impression is that Martin is overall fairly stable--Still having fecal incontinence, but per CM, not as frequent as before.  CM is unsure of how much is truly incontinence vs poor hygiene/behavioral.  CM not aware or weight loss or new complaints.          Will plan on the following:   - [name] will help Savier reschedule to see me in clinic at their next meeting   - I will reach out to GI to discuss how to best obtain his EGD and colonoscopy, which I think is still indicated in light of patient's ongoing sx and prior abnormal imaging results   - Will plan on repeating labs at patient's next visit

## 2017-05-27 ENCOUNTER — Telehealth (HOSPITAL_BASED_OUTPATIENT_CLINIC_OR_DEPARTMENT_OTHER): Payer: Self-pay | Admitting: Physician Assistant

## 2017-05-27 NOTE — Telephone Encounter (Signed)
Vm for patient's case manager to call to schedule a return clinic visit w/Ashley Schmaing per in-basket staff message. sk

## 2017-06-03 ENCOUNTER — Ambulatory Visit (HOSPITAL_BASED_OUTPATIENT_CLINIC_OR_DEPARTMENT_OTHER)
Payer: No Typology Code available for payment source | Attending: Internal Medicine | Admitting: Unknown Physician Specialty

## 2017-06-03 ENCOUNTER — Encounter (HOSPITAL_BASED_OUTPATIENT_CLINIC_OR_DEPARTMENT_OTHER): Payer: Self-pay | Admitting: Unknown Physician Specialty

## 2017-06-03 ENCOUNTER — Telehealth (HOSPITAL_BASED_OUTPATIENT_CLINIC_OR_DEPARTMENT_OTHER): Payer: Self-pay

## 2017-06-03 VITALS — BP 158/63 | HR 67 | Temp 98.6°F | Resp 18 | Wt 195.0 lb

## 2017-06-03 DIAGNOSIS — E118 Type 2 diabetes mellitus with unspecified complications: Secondary | ICD-10-CM | POA: Insufficient documentation

## 2017-06-03 DIAGNOSIS — D72819 Decreased white blood cell count, unspecified: Secondary | ICD-10-CM | POA: Insufficient documentation

## 2017-06-03 DIAGNOSIS — Z23 Encounter for immunization: Secondary | ICD-10-CM | POA: Insufficient documentation

## 2017-06-03 DIAGNOSIS — R197 Diarrhea, unspecified: Secondary | ICD-10-CM | POA: Insufficient documentation

## 2017-06-03 DIAGNOSIS — Z6833 Body mass index (BMI) 33.0-33.9, adult: Secondary | ICD-10-CM

## 2017-06-03 DIAGNOSIS — E538 Deficiency of other specified B group vitamins: Secondary | ICD-10-CM | POA: Insufficient documentation

## 2017-06-03 LAB — CBC, DIFF
% Basophils: 0 %
% Eosinophils: 1 %
% Immature Granulocytes: 0 %
% Lymphocytes: 32 %
% Monocytes: 13 %
% Neutrophils: 54 %
% Nucleated RBC: 0 %
Absolute Eosinophil Count: 0.03 10*3/uL (ref 0.00–0.50)
Absolute Lymphocyte Count: 1.18 10*3/uL (ref 1.00–4.80)
Basophils: 0.01 10*3/uL (ref 0.00–0.20)
Hematocrit: 41 % (ref 38–50)
Hemoglobin: 13.9 g/dL (ref 13.0–18.0)
Immature Granulocytes: 0.01 10*3/uL (ref 0.00–0.05)
MCH: 29.6 pg (ref 27.3–33.6)
MCHC: 33.7 g/dL (ref 32.2–36.5)
MCV: 88 fL (ref 81–98)
Monocytes: 0.47 10*3/uL (ref 0.00–0.80)
Neutrophils: 1.94 10*3/uL (ref 1.80–7.00)
Nucleated RBC: 0 10*3/uL
Platelet Count: 172 10*3/uL (ref 150–400)
RBC: 4.7 10*6/uL (ref 4.40–5.60)
RDW-CV: 13.7 % (ref 11.6–14.4)
WBC: 3.64 10*3/uL — ABNORMAL LOW (ref 4.30–10.00)

## 2017-06-03 LAB — COMPREHENSIVE METABOLIC PANEL
ALT (GPT): 33 U/L (ref 10–64)
AST (GOT): 24 U/L (ref 9–38)
Albumin: 4.6 g/dL (ref 3.5–5.2)
Alkaline Phosphatase (Total): 117 U/L (ref 39–139)
Anion Gap: 5 (ref 4–12)
Bilirubin (Total): 1.3 mg/dL (ref 0.2–1.3)
Calcium: 9.7 mg/dL (ref 8.9–10.2)
Carbon Dioxide, Total: 31 meq/L (ref 22–32)
Chloride: 100 meq/L (ref 98–108)
Creatinine: 1.01 mg/dL (ref 0.51–1.18)
GFR, Calc, African American: >60 mL/min/{1.73_m2} (ref >59)
GFR, Calc, European American: >60 mL/min/{1.73_m2} (ref >59)
Glucose: 121 mg/dL (ref 62–125)
Potassium: 3.6 meq/L (ref 3.6–5.2)
Protein (Total): 7.2 g/dL (ref 6.0–8.2)
Sodium: 136 meq/L (ref 135–145)
Urea Nitrogen: 12 mg/dL (ref 8–21)

## 2017-06-03 NOTE — Telephone Encounter (Signed)
Received call from patient's case manager, [name], who confirmed appointment with GI clinic.

## 2017-06-03 NOTE — Patient Instructions (Addendum)
Check labs today -- if we need to change any medicines, I will call your pharmacy and let you know.  Lab is on ground floor.     We will schedule you for GI appointment to talk about colonoscopy.  I will call patrick to help figure this out     Stop metoprolol -- I will tell the pharmacy     We may add another medicine for blood pressure in the future     Keep taking your other medicines     I will send forms to Access

## 2017-06-03 NOTE — Progress Notes (Signed)
Vaccine Screening Questions    Interpreter: No    1. Are you allergic to Latex? NO    2.  Have you had a serious reaction or an allergic reaction to a vaccine?  NO    3.  Currently have a moderate or severe illness, including fever?  NO    4.  Ever had a seizure or any neurological problem associated with a vaccine? (DTaP/TDaP/DTP pertinent) NO    5.  Is patient receiving any live vaccinations today? (Varicella-Chickenpox, MMR-Measles/Mumps/Rubella, Zoster-Shingles, Flumist, Yellow Fever) NOTE: oral rotavirus is exempt  NO    If YES to any of the questions above - Do NOT give vaccine.  Consult with RN or provider in clinic.  (#5 can be YES if all Live vaccine questions are answered NO)    If NO to all questions above - Patient may receive vaccine.    6.  Do you need to receive the Flu vaccine today? YES - Additional Flu Questions  Flu Vaccine Screening Questions:    Ever had a serious allergic reaction to eggs?  NO    Ever had Guillain-Barre syndrome associated with a vaccine? NO    Less than 6 months old? NO    If YES to any of the Flu questions above - NO Flu Vaccine to be given.  Patient may consult provider as needed.    If NO to all questions above - Patient may receive Flu Shot (IM)    Patient left without any complication.Patient tolerated it well.      All patients are encouraged to wait 15 minutes before leaving after receiving any vaccine.    VIS given 06/03/2017 by Hezzie Bump.    Instruction:  Problems that could happen after any vaccine:   Brief fainting spells can happen after any medical procedure, including vaccination. Sitting or lying down for about 15 minutes can help prevent fainting, and injuries caused by a fall. Tell your doctor if you feel dizzy, or have vision changes or ringing in the ears.   Severe shoulder pain and reduced range of motion in the arm where a shot was given can happen, very rarely, after a vaccination.    Severe allergic reactions from a vaccine are very rare, estimated  at less than 1 in a million doses. If one were to occur, it would usually be within a few minutes to a few hours after the vaccination.   Mild problems following all vaccines:   Hoarseness, sore, red or itchy eyes, cough, fever, aches, headache, itching, fatigue   If these problems occur, they usually begin soon after the shot and last 1 or 2 days.   What should I look for?   Look for anything that concerns you, such as signs of a severe allergic reaction, very high fever, or behavior changes.  Signs of a severe allergic reaction can include hives, swelling of the face and throat, difficulty breathing, a fast heartbeat, dizziness, and weakness. These would usually start a few minutes to a few hours after the vaccination.  What should I do?   If you think it is a severe allergic reaction or other emergency that cant wait, call 9-1-1 and get the person to the nearest hospital. Otherwise, call your doctor.     [  ] Interpreter used; language  [ x] Patient voiced understanding  Time spent teaching patient:  [  ] 0-4 minutes  _0  5 minutes  [  ]16-30 minutes  [  ] >30  minutes

## 2017-06-03 NOTE — Progress Notes (Signed)
Adult Medicine Clinic- Return Visit    Date: 06/03/2017    ID/CC:  Follow up     Subjective/Interval History:  I last saw Justin Zavala.  In the interim, we had several visits planned, but Justin Zavala did not present for these appointments.  He did, however, see another provider in [facility] last in June 2018 as well as appts in GI.     In brief, at our last visit, pt was having some GI sx, and his case manager and [place] provider were both worried that he seemed more fatigued, having some incontinence and weight loss.  Labs were notable for B12 deficiency with anemia and leukopenia.  CT abdomen was obtained which showed inflammation at terminal ileum.  Colonoscopy and GI referrals were ordered.    Patient saw GI to discuss colon.  At that time, they agreed with EGD (given B12 def) and colon to help evaluate.  They would like stool studies as well, but did not think these would be feasible for patient to obtain.  They also obtained a CRP which was elevated at 41.     Since that time, it has been difficult for patient to get scheduled for these studies.  A number of attempts to schedule have been made w/ patient's CM.  Procedures were scheduled, but then cancelled by patient's sister in November due to difficulty with prep.      I called patient's CM after he missed our 2/12 appt-- see telephone note from that date.  I have reached out to GI, who would like to schedule patient and is waiting on a call back.     Today, Justin Zavala states he is feeling "fine".  he is here by himself.  His main concern is that I help him with his Access paperwork.  Still lives by himself, sister is across the street.  Sees Justin Zavala every Tuesday and Thursday.  States that he picks up his meds weekly and takes them all, and continues to see [facility] for psych care and his antipsychotic injection.  He is not sure how often he has diarrhea, but denies abdominal pain.  Later he tells me that he has avg 3 BM daily which he states are formed. No bloody stools.  No  fevers, chest pain, SOB.  No medications for diarrhea     He uses an inhaler sometimes, but states that his breathing feels ok overall.      He states that he went to the eye doctor ([facility] on [address]) every 2 years.  He states his vision is "not good" but is better with his glasses.  He needs to buy a new pair however.  Not sure when he last went, but says I can ask Justin Zavala.        Outpatient Medications Prior to Visit   Medication Sig Dispense Refill    Albuterol Sulfate HFA 108 (90 Base) MCG/ACT Inhalation Aero Soln Inhale 2 puffs by mouth every 6 hours as needed. For shortness of breath. 1 Inhaler 6    Clotrimazole 1 % External Cream Apply 1 application topically 2 times a day. Apply to feet for fungal rash. 45 g 3    Cyanocobalamin 1000 MCG Oral Tab Take 1 tablet (1,000 mcg) by mouth daily. 30 tablet 6    Divalproex Sodium 250 MG Oral Tab EC Take 750 mg by mouth at bedtime.      HydroCHLOROthiazide 25 MG Oral Tab TAKE 1 TABLET BY MOUTH DAILY 30 tablet 6  Ketoconazole 2 % External Cream Apply to affected area on face daily. 30 g 2    Lisinopril 40 MG Oral Tab TAKE 1 TABLET BY MOUTH DAILY (40 MG) 28 tablet 5    Loperamide HCl 2 MG Oral Cap Take 2 capsules by mouth initially, then take 1 capsule after subsequent loose stools.  No more than 8 capsules per day. 30 capsule 3    Loratadine 10 MG Oral Tab TAKE 1 TABLET BY MOUTH DAILY 28 tablet 11    MetFORMIN HCl 500 MG Oral Tab TAKE 2 TABLETS (1000MG ) BY MOUTH TWICE A DAY 112 tablet 5    Metoprolol Succinate ER 25 MG Oral TABLET SR 24 HR Take 0.5 tablets (12.5 mg) by mouth daily. - Please schedule an appointment with Dr Bobbye Charleston 14 tablet 0    Paliperidone Palmitate 234 MG/1.5ML Intramuscular Suspension Inject 234 mg intramuscularly every 4 weeks.      PEG 3350-KCl-NaBcb-NaCl-NaSulf (GOLYTELY) 236 g Oral Recon Soln Use as instructed for colonoscopy prep. 4000 mL 0    Potassium Chloride Crys ER 20 MEQ Oral Tab CR Take 1 tablet (20 mEq) by mouth  daily. 30 tablet 6    Pravastatin Sodium 40 MG Oral Tab Take 1 tablet (40 mg) by mouth every evening. - Please schedule an appointment with Dr Bobbye Charleston 28 tablet 0    RaNITidine HCl 150 MG Oral Tab TAKE 1 TABLET BY MOUTH TWICE A DAY 56 tablet 5     No facility-administered medications prior to visit.          Objective:  BP (!) 159/59 Comment: Pt states that they "just took medicine"   Pulse 72    Temp 98.6 F (37 C)    Resp 18    Wt 195 lb (88.5 kg)    BMI 33.47 kg/m      Wt Readings from Last 3 Encounters:   06/03/17 195 lb (88.5 kg)   10/17/16 187 lb 2.7 oz (84.9 kg)   10/02/16 189 lb 6.4 oz (85.9 kg)      BP Readings from Last 3 Encounters:   06/03/17 (!) 159/59   10/17/16 143/55   10/02/16 130/64       Gen: disheveled male, sitting in chair in NAD, pleasant   Pulm: CTAB, no wheezing  CV: RRR, no murmur appreciated, ext warm   Abd: + BS, soft, nontender, nondistended        Results for orders placed or performed in visit on 10/17/16   CRP, HIGH SENSITIVITY   Result Value Ref Range    C_Reactive Protein 41.6 (H) 0.0 - 10.0 mg/L         Assessment/Plan:  Kelvon was seen today for diabetes.    Diagnoses and all orders for this visit:    Diarrhea, unspecified type: unclear etiology, but concerning for IBD in the context of prior CT results with elevated inflammatory markers.  Reassuringly though, his wt is no longer downtrending.  Associated with fecal incontinence at times, but difficult to decipher if this is in part due to behavioral issues.  Colonoscopy and EGD have been difficult to arrange due to difficulty with prep with patient's developmental delay.  Have arranged for patient to see GI again to discuss, and I will reach out to his CM and sister to try to have them at the appt.    - Comprehensive Metabolic Panel  - Will assist with GI referral/scheduling today and will try to coordinate with Justin Zavala     B12  deficiency: ddx includes metformin, notably EGD is pending with GI given hx of B12 deficiency to  further eval.  Improved on oral B12 with resolution of his anemia, improvement of his WBC on last labs. Will repeat labs today.    - CBC with Differential    Type 2 diabetes mellitus with complication, without long-term current use of insulin ([place]): Excellent control on metformin.  Could consider decreasing dose in future.  Has had eye exams at OSH, will confirm with CM.   - Hemoglobin A1c  - Urine at next visit to eval for albuminuria   - Continue statin, HTN plan as below     HCM  -     influenza vaccine quadrivalent PF (adult) 0.5 mL IM - 00174    Leukopenia, unspecified type: previously has had low normal WBC for years, worsened last year and then improved following B12 supplementation, indicating acute change was likely due to B12 def.  Smear showed no WBC abnormalities.  Previously HIV, hep C negative. Ddx includes 2/2 med side effects (specifically valproate), may also be benign variant given chronically low normal values.  WIll recheck labs to ensure not worsening again and not neutropenic.    - CBC w/ diff    Schizophrenia: Continue follow up at [facility] with Margaretha Glassing  and his case manager Justin Zavala.    -Continue psych medications per mental health provider (currently palperidone IM q28 days and divalproex 750 daily)     HTN: Above goal today, pt reports took his meds.  Will get BMP as above to check renal function and electrolytes, and will likely uptitrate his HCTZ (or switch to chlorthalidone) if appropriate.  Could consider amlodipine in future as well.  Of note, do not think metop is contributing significantly to HTN control and has no other indication, so will discontinue at this time.     -Continue HCTZ 25, Lisinopril 40  - Stop metoprolol 12.5   - Check BMP today, will consider increasing HCTZ pending results     Not discussed: asthma, GERD     Discussed with: Dr. Jeneen Rinks

## 2017-06-03 NOTE — Telephone Encounter (Signed)
Scheduled patient to see GI clinic, gave appointment letter to patient in clinic. Called patient's case manager but he was not available. Left GI appointment information on voicemail and to call [facility] if he had any questions or concerns.

## 2017-06-04 LAB — HEMOGLOBIN A1C, HPLC: Hemoglobin A1C: 6.1 % — ABNORMAL HIGH (ref 4.0–6.0)

## 2017-06-10 NOTE — Progress Notes (Signed)
I have personally discussed the case with the resident during or immediately after the patient visit including review of history, physical exam, diagnosis, and treatment plan. I agree with the assessment and plan of care.

## 2017-06-17 ENCOUNTER — Encounter (HOSPITAL_BASED_OUTPATIENT_CLINIC_OR_DEPARTMENT_OTHER): Payer: Self-pay | Admitting: Physician Assistant

## 2017-06-17 ENCOUNTER — Ambulatory Visit (HOSPITAL_BASED_OUTPATIENT_CLINIC_OR_DEPARTMENT_OTHER): Payer: No Typology Code available for payment source | Attending: Physician Assistant | Admitting: Physician Assistant

## 2017-06-17 VITALS — BP 123/56 | HR 84 | Temp 97.3°F | Resp 15 | Ht 61.61 in | Wt 195.1 lb

## 2017-06-17 DIAGNOSIS — R197 Diarrhea, unspecified: Secondary | ICD-10-CM | POA: Insufficient documentation

## 2017-06-17 NOTE — Progress Notes (Signed)
CHIEF COMPLAINT: Hx diarrhea     HPI:   Justin Zavala is a 52 year old male who presents to the outpatient Gastroenterology clinic for hx diarrhea     Last clinic visit: 10/17/16  Initially seen in GI clinic on 10/17/16    Patient with developmental delay, schizophrenia and T2DM presents to [facility] for follow-up. Was last seen in GI clinic in July 2018 after he was referred for evaluation of diarrhea going on for potentially over a year. CT scan performed June 2018 showed moderate circumferential thickening of jejunum and fluid-filled portions of ileum. At the time of our initial visit, I recommended EGD/colonoscopy for further work-up of chronic diarrhea in light of CT findings and also for evaluation of low vitamin B12. Unfortunately we've had difficulty getting this patient scheduled for his procedures.     At today's visit, patient presents with his case manager [name]. No family members/care givers are present today. Patient is a poor historian:  - He states his diarrhea has improved. He has 2-3 solid stools/day. No blood that he is aware of.   - [name] adds that [name] issues with diarrhea have improved from what they were. He no longer comes to appointments with soiled clothing on a regular basis. He also shares that [name] family is going through a difficult time and may not be able to assist him with the prep.     Patient denies: hematochezia, diarrhea    GI History  Use of NSAID's: NO  GI PMH: NEGATIVE - cholecystitis, cholelithiasis, choledocholithiasis, previous documented ulcer or H. Pylori, abdominal surgeries, IBS, IBD   Bowel habits: varies - episodes of self-limiting diarrhea with incontinence, formed stools other times  Diet: did not assess in detail   Alcohol consumption:  NO  Tobacco: NO  Marijuana: NO  Illicit substances: NO   Family history of GI malignancies: NO  Recent labs:   - 06/03/17 CBC - mildly low WBC 3. 64; CMP WNL  - 10/17/16 CRP - elevated 41  Endoscopies:  N/A  Imaging: 09/19/16 CT A/P - moderate circumferential thickening of the jejunum and fluid-filled portions of the ileum, all consistent with enteritis (chronic or acute on chronic) or transmural infiltration by another entity such as autoimmune or inflammatory disease, low-grade lymphoma such as MALT is not excluded    OTHER MEDICAL HISTORY    MEDICATIONS:  Current Outpatient Medications   Medication Sig Dispense Refill   . Albuterol Sulfate HFA 108 (90 Base) MCG/ACT Inhalation Aero Soln Inhale 2 puffs by mouth every 6 hours as needed. For shortness of breath. 1 Inhaler 6   . Clotrimazole 1 % External Cream Apply 1 application topically 2 times a day. Apply to feet for fungal rash. 45 g 3   . Cyanocobalamin 1000 MCG Oral Tab Take 1 tablet (1,000 mcg) by mouth daily. 30 tablet 6   . Divalproex Sodium 250 MG Oral Tab EC Take 750 mg by mouth at bedtime.     [name] HydroCHLOROthiazide 25 MG Oral Tab TAKE 1 TABLET BY MOUTH DAILY 30 tablet 6   . Ketoconazole 2 % External Cream Apply to affected area on face daily. (Patient not taking: Reported on 06/17/2017) 30 g 2   . Lisinopril 40 MG Oral Tab TAKE 1 TABLET BY MOUTH DAILY (40 MG) 28 tablet 5   . Loperamide HCl 2 MG Oral Cap Take 2 capsules by mouth initially, then take 1 capsule after subsequent loose stools.  No more than 8 capsules per  day. (Patient not taking: Reported on 06/17/2017) 30 capsule 3   . Loratadine 10 MG Oral Tab TAKE 1 TABLET BY MOUTH DAILY 28 tablet 11   . MetFORMIN HCl 500 MG Oral Tab TAKE 2 TABLETS (1000MG ) BY MOUTH TWICE A DAY 112 tablet 5   . Paliperidone Palmitate 234 MG/1.5ML Intramuscular Suspension Inject 234 mg intramuscularly every 4 weeks.     [name] PEG 3350-KCl-NaBcb-NaCl-NaSulf (GOLYTELY) 236 g Oral Recon Soln Use as instructed for colonoscopy prep. (Patient not taking: Reported on 06/17/2017) 4000 mL 0   . Potassium Chloride Crys ER 20 MEQ Oral Tab CR Take 1 tablet (20 mEq) by mouth daily. 30 tablet 6   . Pravastatin Sodium 40 MG Oral Tab Take 1  tablet (40 mg) by mouth every evening. - Please schedule an appointment with Dr Bobbye Charleston 28 tablet 0   . RaNITidine HCl 150 MG Oral Tab TAKE 1 TABLET BY MOUTH TWICE A DAY 56 tablet 5     No current facility-administered medications for this visit.        ALLERGIES:  Review of patient's allergies indicates:  No Known Allergies    PAST MEDICAL AND SURGICAL HISTORY:  Past Medical History:   Diagnosis Date   . Allergic rhinitis, cause unspecified    . Chronic obstructive asthma, unspecified ([place])    . Diabetes mellitus ([place])    . Lipidoses    . Obesity, unspecified    . 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 Other specified types of schizophrenia, unspecified condition ([place])    . Unspecified essential hypertension      No past surgical history on file.    SOCIAL HISTORY:  Tobacco:   Social History     Tobacco Use   Smoking Status Never Smoker   Smokeless Tobacco Never Used     EtOH:   Social History     Substance and Sexual Activity   Alcohol Use No       FAMILY HISTORY:   Family History     Problem (# of Occurrences) Relation (Name,Age of Onset)    Cancer (1) Mother    Diabetes (1) Mother    Mental Illness (1) Sister    Stroke (1) Mother: 2012 stroke          ROS: Positives and pertinent negatives per interval history; otherwise, a complete ROS is negative.      PHYSICAL EXAM:   CONSTITUTIONAL:   Height: Ht 5' 1.614" (1.565 m)     Weight: Wt 195 lb 1.7 oz (88.5 kg)  Vital signs:  BP 123/56   Pulse 84   Temp 97.3 F (36.3 C) (Temporal)   Resp 15   Ht 5' 1.61" (1.565 m)   Wt 195 lb 1.7 oz (88.5 kg)   BMI 36.13 kg/m   Physical Exam   Constitutional: He appears well-developed and well-nourished.   HENT:   Head: Normocephalic and atraumatic.   Eyes: Conjunctivae are normal.   Neck: Normal range of motion.   Pulmonary/Chest: Effort normal.   Musculoskeletal: Normal range of motion.   Neurological: He is alert.   Skin: No rash noted.   Psychiatric: He has a normal mood and affect.   Vitals reviewed.    LABORATORY  DATA:  Office Visit on 06/03/2017   Component Date Value   . WBC 06/03/2017 3.64*   . RBC 06/03/2017 4.70    . Hemoglobin 06/03/2017 13.9    . Hematocrit 06/03/2017 41    . MCV  06/03/2017 88    . [facility] 06/03/2017 29.6    . MCHC 06/03/2017 33.7    . Platelet Count 06/03/2017 172    . RDW-CV 06/03/2017 13.7    . % Neutrophils 06/03/2017 54    . % Lymphocytes 06/03/2017 32    . % Monocytes 06/03/2017 13    . % Eosinophils 06/03/2017 1    . % Basophils 06/03/2017 0    . % Immature Granulocytes 06/03/2017 0    . Neutrophils 06/03/2017 1.94    . Absolute Lymphocyte Count 06/03/2017 1.18    . Monocytes 06/03/2017 0.47    . Absolute Eosinophil Count 06/03/2017 0.03    . Basophils 06/03/2017 0.01    . Immature Granulocytes 06/03/2017 0.01    . Nucleated RBC 06/03/2017 0.00    . % Nucleated RBC 06/03/2017 0    . Hemoglobin A1C 06/03/2017 6.1*   . Sodium 06/03/2017 136    . Potassium 06/03/2017 3.6    . Chloride 06/03/2017 100    . Carbon Dioxide, Total 06/03/2017 31    . Anion Gap 06/03/2017 5    . Glucose 06/03/2017 121    . Urea Nitrogen 06/03/2017 12    . Creatinine 06/03/2017 1.01    . Protein (Total) 06/03/2017 7.2    . Albumin 06/03/2017 4.6    . Bilirubin (Total) 06/03/2017 1.3    . Calcium 06/03/2017 9.7    . AST (GOT) 06/03/2017 24    . Alkaline Phosphatase (To* 06/03/2017 117    . ALT (GPT) 06/03/2017 33    . GFR, Calc, European Amer* 06/03/2017 >60    . GFR, Calc, African Ameri* 06/03/2017 >60    . GFR, Information 06/03/2017 Calculated GFR in mL/min/1.73 m2 by MDRD equation.  Inaccurate with changing renal function.  See [email or web address]    Office Visit on 10/17/2016   Component Date Value   . C_Reactive Protein 10/17/2016 41.6*       RADIOLOGIC STUDIES:  09/19/16 CT A/P  IMPRESSION:  Sections of moderate circumferential thickening of the jejunum and fluid-filled portions of the ileum, all consistent with enteritis (chronic or acute on chronic) or transmural infiltration by  another entity such as autoimmune or inflammatory disease. Findings are not typical for lymphoma however low-grade lymphoma such as MALT is not excluded. No neoplasm elsewhere. No other findings to explain clinical symptoms.     ENDOSCOPIES:  N/A    IMPRESSION:   Patient is a 52 yo male with developmental delay, schizophrenia and T2DM here for follow-up. Was last seen in GI clinic in July 2018 after he was referred for evaluation of diarrhea going on for potentially over a year. CT scan performed June 2018 showed moderate circumferential thickening of jejunum and fluid-filled portions of ileum. At the time of our initial visit, I recommended EGD/colonoscopy for further work-up of chronic diarrhea in light of CT findings and also for evaluation of low vitamin B12. Unfortunately we've had difficulty getting this patient scheduled for his procedures.     At today's visit, patient presents with his case manager but no family member/caregiver who I can review the prep instructions with. Per patient report, his diarrhea has improved and he now has solid stools. Case manager, [name], believes that his diarrhea has improved. Either way, patient has poor hygiene at baseline and is a poor historian, so it's difficult to tell for sure if his diarrhea has improved or not. We discussed attempting again EGD/colonoscopy but case manager is doubtful that family will  be able to assist him with prep right now due to family issues. Matilde would prefer not to do procedures at this time. An alternative would be to repeat CT scan to see if inflammation has improved and to perform a fecal calprotectin which is an adjunctive test used to determine level of inflammation in the colon. If both normal, we could revisit whether or not endoscopies are appropriate. If either abnormal, would strongly advise further work-up with EGD/colonoscopy. In that case, we could alter the prep instructions to same-day prep to avoid waking up early in the  morning to complete prep. [name] will also check to see if he qualifies for assistance from a caregiver for the prep. As a last resort will see if inpatient prep is at all feasible.     Differential diagnoses considered include acute gastroenteritis, poor hygiene, IBS, IBD, poor rectal tone, diarrhea 2/2 medication side effect or food sensitivity, malignancy      RECOMMENDATIONS:   The encounter diagnosis was Diarrhea, unspecified type.    Based on this we formulated the following plan:    1. Recommend labs today including fecal calprotectin   2. Recommend evaluation with CT A/P  3. Will contact patient with next steps after results reviewed.     I spent a total time of 25 minutes face-to-face with the patient, of which more than 50% was spent counseling and coordinating care as outlined in this note.

## 2017-06-17 NOTE — Patient Instructions (Signed)
1. I would like you to complete a stool test and a CT scan so we can check to see if there's still signs of inflammation. If the results are positive, we would strongly encourage you to get the procedures done.     Please do not hesitate to contact the [facility] Gastroenterology clinic should you have any additional questions or concerns. Our phone number is [phone], option 8. You can also contact us via e-care message.     Kind regards,    Delrae Sawyers PA-C  Physician Assistant   Gastroenterology  [facility]  [address] [place] [address]  [email or web address]

## 2017-06-20 ENCOUNTER — Other Ambulatory Visit (HOSPITAL_BASED_OUTPATIENT_CLINIC_OR_DEPARTMENT_OTHER): Payer: Self-pay | Admitting: Unknown Physician Specialty

## 2017-06-20 DIAGNOSIS — K219 Gastro-esophageal reflux disease without esophagitis: Secondary | ICD-10-CM

## 2017-06-20 DIAGNOSIS — I10 Essential (primary) hypertension: Secondary | ICD-10-CM

## 2017-06-20 DIAGNOSIS — E118 Type 2 diabetes mellitus with unspecified complications: Secondary | ICD-10-CM

## 2017-06-20 MED ORDER — LISINOPRIL 40 MG OR TABS
ORAL_TABLET | ORAL | 5 refills | Status: DC
Start: 2017-06-20 — End: 2017-12-31

## 2017-06-20 MED ORDER — METFORMIN HCL 500 MG OR TABS
ORAL_TABLET | ORAL | 5 refills | Status: DC
Start: 2017-06-20 — End: 2017-12-31

## 2017-06-20 MED ORDER — RANITIDINE HCL 150 MG OR TABS
150.0000 mg | ORAL_TABLET | Freq: Two times a day (BID) | ORAL | 5 refills | Status: DC
Start: 2017-06-20 — End: 2017-12-31

## 2017-06-20 MED ORDER — POTASSIUM CHLORIDE CRYS ER 20 MEQ OR TBCR
20.0000 meq | EXTENDED_RELEASE_TABLET | Freq: Every day | ORAL | 5 refills | Status: DC
Start: 2017-06-20 — End: 2017-12-31

## 2017-06-20 MED ORDER — HYDROCHLOROTHIAZIDE 25 MG OR TABS
ORAL_TABLET | ORAL | 5 refills | Status: DC
Start: 2017-06-20 — End: 2017-12-31

## 2017-07-03 ENCOUNTER — Ambulatory Visit (HOSPITAL_BASED_OUTPATIENT_CLINIC_OR_DEPARTMENT_OTHER): Payer: No Typology Code available for payment source

## 2017-10-13 ENCOUNTER — Encounter (HOSPITAL_BASED_OUTPATIENT_CLINIC_OR_DEPARTMENT_OTHER): Payer: No Typology Code available for payment source | Admitting: Unknown Physician Specialty

## 2017-10-28 ENCOUNTER — Other Ambulatory Visit (HOSPITAL_BASED_OUTPATIENT_CLINIC_OR_DEPARTMENT_OTHER): Payer: Self-pay | Admitting: Unknown Physician Specialty

## 2017-10-28 DIAGNOSIS — J309 Allergic rhinitis, unspecified: Secondary | ICD-10-CM

## 2017-10-29 MED ORDER — LORATADINE 10 MG OR TABS
ORAL_TABLET | ORAL | 5 refills | Status: DC
Start: 2017-10-29 — End: 2017-12-31

## 2017-11-17 ENCOUNTER — Encounter (HOSPITAL_BASED_OUTPATIENT_CLINIC_OR_DEPARTMENT_OTHER): Payer: No Typology Code available for payment source | Admitting: Unknown Physician Specialty

## 2017-11-18 ENCOUNTER — Encounter (HOSPITAL_BASED_OUTPATIENT_CLINIC_OR_DEPARTMENT_OTHER): Payer: No Typology Code available for payment source | Admitting: Unknown Physician Specialty

## 2017-12-29 ENCOUNTER — Encounter (HOSPITAL_BASED_OUTPATIENT_CLINIC_OR_DEPARTMENT_OTHER): Payer: No Typology Code available for payment source | Admitting: Unknown Physician Specialty

## 2017-12-31 ENCOUNTER — Encounter (HOSPITAL_BASED_OUTPATIENT_CLINIC_OR_DEPARTMENT_OTHER): Payer: Self-pay | Admitting: Internal Medicine

## 2017-12-31 ENCOUNTER — Ambulatory Visit (HOSPITAL_BASED_OUTPATIENT_CLINIC_OR_DEPARTMENT_OTHER): Payer: No Typology Code available for payment source | Attending: Internal Medicine | Admitting: Internal Medicine

## 2017-12-31 VITALS — BP 146/57 | HR 73 | Temp 97.2°F | Resp 16 | Ht 61.0 in | Wt 193.0 lb

## 2017-12-31 DIAGNOSIS — K219 Gastro-esophageal reflux disease without esophagitis: Secondary | ICD-10-CM | POA: Insufficient documentation

## 2017-12-31 DIAGNOSIS — J309 Allergic rhinitis, unspecified: Secondary | ICD-10-CM | POA: Insufficient documentation

## 2017-12-31 DIAGNOSIS — E118 Type 2 diabetes mellitus with unspecified complications: Secondary | ICD-10-CM | POA: Insufficient documentation

## 2017-12-31 DIAGNOSIS — E785 Hyperlipidemia, unspecified: Secondary | ICD-10-CM | POA: Insufficient documentation

## 2017-12-31 DIAGNOSIS — D649 Anemia, unspecified: Secondary | ICD-10-CM | POA: Insufficient documentation

## 2017-12-31 DIAGNOSIS — J45909 Unspecified asthma, uncomplicated: Secondary | ICD-10-CM | POA: Insufficient documentation

## 2017-12-31 DIAGNOSIS — R197 Diarrhea, unspecified: Secondary | ICD-10-CM | POA: Insufficient documentation

## 2017-12-31 DIAGNOSIS — I10 Essential (primary) hypertension: Secondary | ICD-10-CM | POA: Insufficient documentation

## 2017-12-31 DIAGNOSIS — Z6836 Body mass index (BMI) 36.0-36.9, adult: Secondary | ICD-10-CM

## 2017-12-31 MED ORDER — METFORMIN HCL 500 MG OR TABS
ORAL_TABLET | ORAL | 5 refills | Status: DC
Start: 2017-12-31 — End: 2018-06-22

## 2017-12-31 MED ORDER — ALBUTEROL SULFATE HFA 108 (90 BASE) MCG/ACT IN AERS
2.0000 | INHALATION_SPRAY | Freq: Four times a day (QID) | RESPIRATORY_TRACT | 6 refills | Status: DC | PRN
Start: 2017-12-31 — End: 2018-08-05

## 2017-12-31 MED ORDER — POTASSIUM CHLORIDE CRYS ER 20 MEQ OR TBCR
20.0000 meq | EXTENDED_RELEASE_TABLET | Freq: Every day | ORAL | 5 refills | Status: DC
Start: 2017-12-31 — End: 2018-06-22

## 2017-12-31 MED ORDER — PRAVASTATIN SODIUM 40 MG OR TABS
40.0000 mg | ORAL_TABLET | Freq: Every evening | ORAL | 0 refills | Status: DC
Start: 2017-12-31 — End: 2018-02-19

## 2017-12-31 MED ORDER — LISINOPRIL 40 MG OR TABS
ORAL_TABLET | ORAL | 5 refills | Status: DC
Start: 2017-12-31 — End: 2018-06-22

## 2017-12-31 MED ORDER — RANITIDINE HCL 150 MG OR TABS
150.0000 mg | ORAL_TABLET | Freq: Two times a day (BID) | ORAL | 5 refills | Status: DC
Start: 2017-12-31 — End: 2018-06-22

## 2017-12-31 MED ORDER — HYDROCHLOROTHIAZIDE 25 MG OR TABS
ORAL_TABLET | ORAL | 5 refills | Status: DC
Start: 2017-12-31 — End: 2018-06-22

## 2017-12-31 MED ORDER — CYANOCOBALAMIN 1000 MCG OR TABS
1000.0000 ug | ORAL_TABLET | Freq: Every day | ORAL | 6 refills | Status: DC
Start: 2017-12-31 — End: 2019-03-12

## 2017-12-31 MED ORDER — LORATADINE 10 MG OR TABS
ORAL_TABLET | ORAL | 5 refills | Status: DC
Start: 2017-12-31 — End: 2018-06-22

## 2017-12-31 NOTE — Progress Notes (Signed)
[facility] ADULT MEDICINE CLINIC  OUTPATIENT RETURN NOTE    DATE: 12/31/2017    ID/CC:  Mr. Spengler is a 52 year old English-speaking male w developmental delay, schizophrenia and T2DM who presents today for follow-up chronic diarrhea.    _________________________________________________________________  ASSESSMENT & PLAN:    #Diarrhea, unspecified type  Improving, no need for urgent intervention today. Unclear etiology, but concerning for IBD in the context of prior CT results with elevated inflammatory markers. Reassuringly, his wt is no longer downtrending. Associated with fecal incontinence at times, but difficult to decipher if this is in part due to behavioral issues. Colonoscopy and EGD have been difficult to arrange due to difficulty with prep with patient's developmental delay, so per GIs most recent recs will move forwards with repeat CT A/P and testing for fecal calprotectin. GI will contact pt w results of these tests and recommended next steps. Briefly, if both results are nl, will revisit whether endoscopy is necessary at that time. If either result is abnormal, GI will strongly advise further workup, and will explore options available to best assist patient for this (?qualify for caregiver for prep vs same-day prep vs inpatient prep as last resort).  - Scheduled CT A/P for 01/16/2018 at 2:30 pm  - Ordered fecal calprotectin, patient will go down to lab to leave stool sample today rather than sending him with a home collection kit    [facility]  - Refilled medications today (excluding psychiatric meds) and encouraged pt to follow up with Dr. Bobbye Charleston for discussion of other health concerns    RTC: 02/23/18 w Dr. Bobbye Charleston  Seen and discussed with: Dr. Tressa Busman  _____________  Cephas Darby, MD  Internal Medicine [facility]    _________________________________________________________________  HPI:  - Last seen by Dr. Bobbye Charleston on 06/03/17 for follow-up chronic diarrhea ,fecal incontinence, fatigue, and  weight loss  - Patient most recently saw GI on 06/17/17 to follow up his chronic diarrhea. At that visit, his Case Manager [name] reported that his diarrhea had improved, having solid stools and no longer having incontinence  - At that visit, GI recommended repeat CT and fecal calprotectin given difficulty with prep for endoscopy, but these have not yet been done  - Today, patient is concerned about my colon because Im almost 71 and everybody keeps mentioning it. His main priority is to avoid staying overnight in the hospital for required evaluation/treatment.  - Pt reiterates that his diarrhea is improved. He stools every day, but they are more solid. He no longer needs to use loperamide for symptomatic relief.  - Does wake to defacate and urinate most every night, but denies urgency/diarrhea/incontinence  - Denies fecal incontinence, hematochezia/melena, abdominal paint  - States that he has stable fatigue, not worsening. Recently started going to school at night (taking religion course at [employer]), wakes early in the morning  - His weight is stable today    - Today, patient is additionally requesting medication refills; he states that his asthma has been flaring up due to the colder weather, and his inhaler is almost out      PHYSICAL EXAM:  BP (!) 146/57    Pulse 73    Temp 97.2 F (36.2 C) (Temporal)    Resp 16    Ht 5' 1"  (1.549 m)    Wt 193 lb (87.5 kg)    SpO2 99% Comment: at room air, resting   BMI 36.47 kg/m     Wt Readings from Last 5  Encounters:   12/31/17 193 lb (87.5 kg)   06/17/17 195 lb 1.7 oz (88.5 kg)   06/03/17 195 lb (88.5 kg)   10/17/16 187 lb 2.7 oz (84.9 kg)   10/02/16 189 lb 6.4 oz (85.9 kg)       BP Readings from Last 5 Encounters:   12/31/17 (!) 146/57   06/17/17 123/56   06/03/17 (!) 158/63   10/17/16 143/55   10/02/16 130/64       GEN: WDWN male in NAD.  HEENT: NCAT, EOMI, no conjunctival icterus or injection. Missing several front teeth.  SKIN: Warm, dry, no rashes, no nail  changes.  CV: RRR, normal S1/S2, no R/M/G No clubbing, cyanosis, or edema.  RESP: Easy work of breathing on RA. CTAB, without crackles, wheezes, or rhonchi.  GI: Soft, NT/ND, BS+  MSK: Normal muscle bulk and tone, moves all extremities spontaneously.  PSYCH: Appropriate and euthymic.  NEURO: Alert and oriented, CN II-XII grossly intact, normal gait.      LAB RESULTS:  Results for orders placed or performed in visit on 06/03/17   CBC with Differential   Result Value Ref Range    WBC 3.64 (L) 4.30 - 10.00 10*3/uL    RBC 4.70 4.40 - 5.60 10*6/uL    Hemoglobin 13.9 13.0 - 18.0 g/dL    Hematocrit 41 38 - 50 %    MCV 88 81 - 98 fL    MCH 29.6 27.3 - 33.6 pg    MCHC 33.7 32.2 - 36.5 g/dL    Platelet Count 172 150 - 400 10*3/uL    RDW-CV 13.7 11.6 - 14.4 %    % Neutrophils 54 %    % Lymphocytes 32 %    % Monocytes 13 %    % Eosinophils 1 %    % Basophils 0 %    % Immature Granulocytes 0 %    Neutrophils 1.94 1.80 - 7.00 10*3/uL    Absolute Lymphocyte Count 1.18 1.00 - 4.80 10*3/uL    Monocytes 0.47 0.00 - 0.80 10*3/uL    Absolute Eosinophil Count 0.03 0.00 - 0.50 10*3/uL    Basophils 0.01 0.00 - 0.20 10*3/uL    Immature Granulocytes 0.01 0.00 - 0.05 10*3/uL    Nucleated RBC 0.00 0.00 10*3/uL    % Nucleated RBC 0 %   Hemoglobin A1c   Result Value Ref Range    Hemoglobin A1C 6.1 (H) 4.0 - 6.0 %   Comprehensive Metabolic Panel   Result Value Ref Range    Sodium 136 135 - 145 meq/L    Potassium 3.6 3.6 - 5.2 meq/L    Chloride 100 98 - 108 meq/L    Carbon Dioxide, Total 31 22 - 32 meq/L    Anion Gap 5 4 - 12    Glucose 121 62 - 125 mg/dL    Urea Nitrogen 12 8 - 21 mg/dL    Creatinine 1.01 0.51 - 1.18 mg/dL    Protein (Total) 7.2 6.0 - 8.2 g/dL    Albumin 4.6 3.5 - 5.2 g/dL    Bilirubin (Total) 1.3 0.2 - 1.3 mg/dL    Calcium 9.7 8.9 - 10.2 mg/dL    AST (GOT) 24 9 - 38 U/L    Alkaline Phosphatase (Total) 117 39 - 139 U/L    ALT (GPT) 33 10 - 64 U/L    GFR, Calc, European American >60 >59 mL/min/[1.73_m2]    GFR, Calc, African  American >60 >59 mL/min/[1.73_m2]    GFR, Information  Calculated GFR in mL/min/1.73 m2 by MDRD equation.  Inaccurate with changing renal function.  See [email or web address]         _________________________________________________________________  PROBLEM LIST:  Patient Active Problem List   Diagnosis    Type 2 diabetes mellitus with complication ([place])    Other specified delay in development    Extrinsic asthma    Health care maintenance    Hypertension    Schizophrenia ([place])    GERD (gastroesophageal reflux disease)    Leukopenia    B12 deficiency    Anemia       MEDICATIONS:  Current Outpatient Medications   Medication Sig Dispense Refill    Albuterol Sulfate HFA 108 (90 Base) MCG/ACT Inhalation Aero Soln Inhale 2 puffs by mouth every 6 hours as needed. For shortness of breath. 1 Inhaler 6    Clotrimazole 1 % External Cream Apply 1 application topically 2 times a day. Apply to feet for fungal rash. 45 g 3    Cyanocobalamin 1000 MCG Oral Tab Take 1 tablet (1,000 mcg) by mouth daily. 30 tablet 6    Divalproex Sodium 250 MG Oral Tab EC Take 750 mg by mouth at bedtime.      hydroCHLOROthiazide 25 MG Oral Tab TAKE 1 TABLET BY MOUTH DAILY 28 tablet 5    Ketoconazole 2 % External Cream Apply to affected area on face daily. (Patient not taking: Reported on 06/17/2017) 30 g 2    Lisinopril 40 MG Oral Tab TAKE 1 TABLET BY MOUTH DAILY (40 MG) 28 tablet 5    Loperamide HCl 2 MG Oral Cap Take 2 capsules by mouth initially, then take 1 capsule after subsequent loose stools.  No more than 8 capsules per day. (Patient not taking: Reported on 06/17/2017) 30 capsule 3    loratadine 10 MG Oral Tab TAKE 1 TABLET BY MOUTH DAILY 28 tablet 5    metFORMIN 500 MG Oral Tab TAKE 2 TABLETS (1000MG) BY MOUTH TWICE A DAY 112 tablet 5    Paliperidone Palmitate 234 MG/1.5ML Intramuscular Suspension Inject 234 mg intramuscularly every 4 weeks.      PEG 3350-KCl-NaBcb-NaCl-NaSulf  (GOLYTELY) 236 g Oral Recon Soln Use as instructed for colonoscopy prep. (Patient not taking: Reported on 06/17/2017) 4000 mL 0    Potassium Chloride Crys ER 20 MEQ Oral Tab CR Take 1 tablet (20 mEq) by mouth daily. 28 tablet 5    Pravastatin Sodium 40 MG Oral Tab Take 1 tablet (40 mg) by mouth every evening. - Please schedule an appointment with Dr Bobbye Charleston 28 tablet 0    raNITIdine HCl 150 MG Oral Tab Take 1 tablet (150 mg) by mouth 2 times a day. 56 tablet 5     No current facility-administered medications for this visit.        ALLERGIES:  Review of patient's allergies indicates:  No Known Allergies

## 2017-12-31 NOTE — Progress Notes (Deleted)
Adult Medicine Clinic- Return Visit    Date: 12/31/2017    ID/CC:        Subjective/Interval History:  I last saw patient on ***, at which time ***    In the interim, ***    Last GI recs:   1. Recommend labs today including fecal calprotectin   2. Recommend evaluation with CT A/P  3. Will contact patient with next steps after results reviewed      No CT scan done or calprotectin stool     Repeat labs (CBC, CMP, A1c, B12, urine)    Stop metformin? Increase HTN?    Objective:  There were no vitals taken for this visit.     Wt Readings from Last 3 Encounters:   06/17/17 195 lb 1.7 oz (88.5 kg)   06/03/17 195 lb (88.5 kg)   10/17/16 187 lb 2.7 oz (84.9 kg)      BP Readings from Last 3 Encounters:   06/17/17 123/56   06/03/17 (!) 158/63   10/17/16 143/55       Gen:  Pulm:       Results for orders placed or performed in visit on 06/03/17   CBC with Differential   Result Value Ref Range    WBC 3.64 (L) 4.30 - 10.00 10*3/uL    RBC 4.70 4.40 - 5.60 10*6/uL    Hemoglobin 13.9 13.0 - 18.0 g/dL    Hematocrit 41 38 - 50 %    MCV 88 81 - 98 fL    MCH 29.6 27.3 - 33.6 pg    MCHC 33.7 32.2 - 36.5 g/dL    Platelet Count 172 150 - 400 10*3/uL    RDW-CV 13.7 11.6 - 14.4 %    % Neutrophils 54 %    % Lymphocytes 32 %    % Monocytes 13 %    % Eosinophils 1 %    % Basophils 0 %    % Immature Granulocytes 0 %    Neutrophils 1.94 1.80 - 7.00 10*3/uL    Absolute Lymphocyte Count 1.18 1.00 - 4.80 10*3/uL    Monocytes 0.47 0.00 - 0.80 10*3/uL    Absolute Eosinophil Count 0.03 0.00 - 0.50 10*3/uL    Basophils 0.01 0.00 - 0.20 10*3/uL    Immature Granulocytes 0.01 0.00 - 0.05 10*3/uL    Nucleated RBC 0.00 0.00 10*3/uL    % Nucleated RBC 0 %   Hemoglobin A1c   Result Value Ref Range    Hemoglobin A1C 6.1 (H) 4.0 - 6.0 %   Comprehensive Metabolic Panel   Result Value Ref Range    Sodium 136 135 - 145 meq/L    Potassium 3.6 3.6 - 5.2 meq/L    Chloride 100 98 - 108 meq/L    Carbon Dioxide, Total 31 22 - 32 meq/L    Anion Gap 5 4 - 12    Glucose 121  62 - 125 mg/dL    Urea Nitrogen 12 8 - 21 mg/dL    Creatinine 1.01 0.51 - 1.18 mg/dL    Protein (Total) 7.2 6.0 - 8.2 g/dL    Albumin 4.6 3.5 - 5.2 g/dL    Bilirubin (Total) 1.3 0.2 - 1.3 mg/dL    Calcium 9.7 8.9 - 10.2 mg/dL    AST (GOT) 24 9 - 38 U/L    Alkaline Phosphatase (Total) 117 39 - 139 U/L    ALT (GPT) 33 10 - 64 U/L    GFR, Calc, European American >60 >59  mL/min/[1.73_m2]    GFR, Calc, African American >60 >59 mL/min/[1.73_m2]    GFR, Information       Calculated GFR in mL/min/1.73 m2 by MDRD equation.  Inaccurate with changing renal function.  See [email or web address]         Assessment/Plan:  There are no diagnoses linked to this encounter.        Follow-up: ***    Discussed with: ***

## 2017-12-31 NOTE — Patient Instructions (Addendum)
Thank you for coming in today! It was a pleasure to see you. Some important things to remember are:    1. We scheduled at CT scan of your colon for October 11th, at 2:30pm.  2. Please go to the lab to leave a stool sample for more testing.  3. The GI doctors will call you with the results of these tests. They will let you know if you should also come in for a colonoscopy.  4. I refilled your medicines today, you can pick them up at the pharmacy.    Schedule an appointment to see Dr. Bobbye Charleston in about 1 month. Please bring all of your medications to your next appointment.    You may call the clinic, or just walk in on weekdays, if you have any health concerns between now and our next visit.

## 2018-01-05 NOTE — Progress Notes (Signed)
I saw and evaluated the patient. I have reviewed the resident's documentation and agree with it.

## 2018-01-05 NOTE — Addendum Note (Signed)
Addended by: Leanord Hawking on: 01/05/2018 09:41 AM     Modules accepted: Level of Service

## 2018-01-16 ENCOUNTER — Ambulatory Visit (HOSPITAL_BASED_OUTPATIENT_CLINIC_OR_DEPARTMENT_OTHER): Payer: No Typology Code available for payment source | Attending: Physician Assistant

## 2018-01-16 DIAGNOSIS — R197 Diarrhea, unspecified: Secondary | ICD-10-CM | POA: Insufficient documentation

## 2018-01-16 LAB — CREATININE BY I_STAT POC, HMC: Creatinine (POC): 1 mg/dL (ref 0.51–1.18)

## 2018-01-20 ENCOUNTER — Encounter (HOSPITAL_BASED_OUTPATIENT_CLINIC_OR_DEPARTMENT_OTHER): Payer: Self-pay | Admitting: Physician Assistant

## 2018-02-17 ENCOUNTER — Ambulatory Visit (HOSPITAL_BASED_OUTPATIENT_CLINIC_OR_DEPARTMENT_OTHER): Payer: No Typology Code available for payment source | Admitting: Physician Assistant

## 2018-02-19 ENCOUNTER — Other Ambulatory Visit (HOSPITAL_BASED_OUTPATIENT_CLINIC_OR_DEPARTMENT_OTHER): Payer: Self-pay | Admitting: Internal Medicine

## 2018-02-19 DIAGNOSIS — E785 Hyperlipidemia, unspecified: Secondary | ICD-10-CM

## 2018-02-19 MED ORDER — PRAVASTATIN SODIUM 40 MG OR TABS
40.0000 mg | ORAL_TABLET | Freq: Every evening | ORAL | 0 refills | Status: DC
Start: 2018-02-19 — End: 2018-02-23

## 2018-02-19 NOTE — Telephone Encounter (Signed)
Urgent request

## 2018-02-23 ENCOUNTER — Ambulatory Visit (HOSPITAL_BASED_OUTPATIENT_CLINIC_OR_DEPARTMENT_OTHER)
Payer: No Typology Code available for payment source | Attending: Internal Medicine | Admitting: Unknown Physician Specialty

## 2018-02-23 VITALS — BP 134/45 | HR 96 | Temp 97.3°F | Resp 16 | Ht 61.0 in | Wt 198.0 lb

## 2018-02-23 DIAGNOSIS — E785 Hyperlipidemia, unspecified: Secondary | ICD-10-CM | POA: Insufficient documentation

## 2018-02-23 DIAGNOSIS — E118 Type 2 diabetes mellitus with unspecified complications: Secondary | ICD-10-CM | POA: Insufficient documentation

## 2018-02-23 DIAGNOSIS — Z23 Encounter for immunization: Secondary | ICD-10-CM | POA: Insufficient documentation

## 2018-02-23 DIAGNOSIS — Z6837 Body mass index (BMI) 37.0-37.9, adult: Secondary | ICD-10-CM

## 2018-02-23 DIAGNOSIS — R197 Diarrhea, unspecified: Secondary | ICD-10-CM | POA: Insufficient documentation

## 2018-02-23 LAB — CBC, DIFF
% Basophils: 0 %
% Eosinophils: 1 %
% Immature Granulocytes: 0 %
% Lymphocytes: 28 %
% Monocytes: 9 %
% Neutrophils: 62 %
% Nucleated RBC: 0 %
Absolute Eosinophil Count: 0.04 10*3/uL (ref 0.00–0.50)
Absolute Lymphocyte Count: 1.52 10*3/uL (ref 1.00–4.80)
Basophils: 0.02 10*3/uL (ref 0.00–0.20)
Hematocrit: 43 % (ref 38–50)
Hemoglobin: 14.7 g/dL (ref 13.0–18.0)
Immature Granulocytes: 0.01 10*3/uL (ref 0.00–0.05)
MCH: 30.1 pg (ref 27.3–33.6)
MCHC: 34.2 g/dL (ref 32.2–36.5)
MCV: 88 fL (ref 81–98)
Monocytes: 0.5 10*3/uL (ref 0.00–0.80)
Neutrophils: 3.29 10*3/uL (ref 1.80–7.00)
Nucleated RBC: 0 10*3/uL
Platelet Count: 212 10*3/uL (ref 150–400)
RBC: 4.88 10*6/uL (ref 4.40–5.60)
RDW-CV: 13 % (ref 11.6–14.4)
WBC: 5.38 10*3/uL (ref 4.30–10.00)

## 2018-02-23 LAB — COMPREHENSIVE METABOLIC PANEL
ALT (GPT): 24 U/L (ref 10–64)
AST (GOT): 23 U/L (ref 9–38)
Albumin: 4.8 g/dL (ref 3.5–5.2)
Alkaline Phosphatase (Total): 120 U/L (ref 39–139)
Anion Gap: 9 (ref 4–12)
Bilirubin (Total): 1.2 mg/dL (ref 0.2–1.3)
Calcium: 10.2 mg/dL (ref 8.9–10.2)
Carbon Dioxide, Total: 28 meq/L (ref 22–32)
Chloride: 100 meq/L (ref 98–108)
Creatinine: 1.15 mg/dL (ref 0.51–1.18)
GFR, Calc, African American: >60 mL/min/{1.73_m2} (ref >59)
GFR, Calc, European American: >60 mL/min/{1.73_m2} (ref >59)
Glucose: 116 mg/dL (ref 62–125)
Potassium: 3.8 meq/L (ref 3.6–5.2)
Protein (Total): 7.5 g/dL (ref 6.0–8.2)
Sodium: 137 meq/L (ref 135–145)
Urea Nitrogen: 15 mg/dL (ref 8–21)

## 2018-02-23 LAB — C_REACTIVE PROTEIN: C_Reactive Protein: 1.2 mg/L (ref 0.0–10.0)

## 2018-02-23 MED ORDER — PRAVASTATIN SODIUM 40 MG OR TABS
40.0000 mg | ORAL_TABLET | Freq: Every evening | ORAL | 11 refills | Status: DC
Start: 2018-02-23 — End: 2019-03-08

## 2018-02-23 NOTE — Patient Instructions (Addendum)
1. Labs today (Ground floor)     2. We will schedule GI appointment.  I will talk to [place] about this so he can remind you    3. Med refills

## 2018-02-23 NOTE — Progress Notes (Signed)
Vaccine Screening Questions    Interpreter: No    1. Are you allergic to Latex? NO    2.  Have you had a serious reaction or an allergic reaction to a vaccine?  NO    3.  Currently have a moderate or severe illness, including fever?  NO    4.  Ever had a seizure or any neurological problem associated with a vaccine? (DTaP/TDaP/DTP pertinent) NO    5.  Is patient receiving any live vaccinations today? (Varicella-Chickenpox, MMR-Measles/Mumps/Rubella, Zoster-Shingles, Flumist, Yellow Fever) NOTE: oral rotavirus is exempt  NO    If YES to any of the questions above - Do NOT give vaccine.  Consult with RN or provider in clinic.  (#5 can be YES if all Live vaccine questions are answered NO)    If NO to all questions above - Patient may receive vaccine.    6.  Do you need to receive the Flu vaccine today? NO      All patients are encouraged to wait 15 minutes before leaving after receiving any vaccine.  Patient left without any complication.Patient tolerated it well.  VIS given 02/23/2018 by Berdine Addison, CMA.    Instruction:  Problems that could happen after any vaccine:   Brief fainting spells can happen after any medical procedure, including vaccination. Sitting or lying down for about 15 minutes can help prevent fainting, and injuries caused by a fall. Tell your doctor if you feel dizzy, or have vision changes or ringing in the ears.   Severe shoulder pain and reduced range of motion in the arm where a shot was given can happen, very rarely, after a vaccination.    Severe allergic reactions from a vaccine are very rare, estimated at less than 1 in a million doses. If one were to occur, it would usually be within a few minutes to a few hours after the vaccination.   Mild problems following all vaccines:   Hoarseness, sore, red or itchy eyes, cough, fever, aches, headache, itching, fatigue   If these problems occur, they usually begin soon after the shot and last 1 or 2 days.   What should I look for?   Look for  anything that concerns you, such as signs of a severe allergic reaction, very high fever, or behavior changes.  Signs of a severe allergic reaction can include hives, swelling of the face and throat, difficulty breathing, a fast heartbeat, dizziness, and weakness. These would usually start a few minutes to a few hours after the vaccination.  What should I do?   If you think it is a severe allergic reaction or other emergency that cant wait, call 9-1-1 and get the person to the nearest hospital. Otherwise, call your doctor.     [  ] Interpreter used; language  [ x] Patient voiced understanding  Time spent teaching patient:  [  ] 0-4 minutes  _0  5 minutes  [  ]16-30 minutes  [  ] >30 minutes       Ordered by: Gust Rung, MD  Exam: GI clinic   Date: 03/24/18 Tuesday at 3:30 pm  Mode of Contact: mailed appointment slip  Duration:  30 MINUTES  Preparation: NO PREPARATION

## 2018-02-23 NOTE — Progress Notes (Signed)
Adult Medicine Clinic- Return Visit    Date: 02/23/2018    ID/CC:  Follow up CT scan    Subjective/Interval History:  Patient was last seen in [facility] 12/2017 by Dr. Constance Goltz.  At that point, CT scan was scheduled that GI had previously recommended.  Labs were not obtained as planned in interim.     Justin Zavala is here today alone.  Denies any abdominal pain.  He states he has about 4 bowel movements a day, sometimes will have a BM overnight as well.  He denies diarrhea, but hard to tell if he understands what I mean with this question.  He does not think there is any blood in his stool.  He states has not had any accidents for the past month, but just "goes a lot".  States that his appetite is good.  Denies weight loss.      He states he did not take his meds for awhile, but his sister has been helping out more recently, so things are going better again.      During the day, he is mostly at group via [facility].  Sister and his care giver help with meals.  Still works with [name] and at [employer].      Objective:  BP 134/45    Pulse 96    Temp 97.3 F (36.3 C) (Temporal)    Resp 16    Ht 5\' 1"  (1.549 m)    Wt 198 lb (89.8 kg)    BMI 37.41 kg/m      Wt Readings from Last 3 Encounters:   02/23/18 198 lb (89.8 kg)   12/31/17 193 lb (87.5 kg)   06/17/17 195 lb 1.7 oz (88.5 kg)      BP Readings from Last 3 Encounters:   02/23/18 134/45   12/31/17 (!) 146/57   06/17/17 123/56       Gen: well appearing, middle aged, in NAD   Pulm: CTAB no increased WOB or respiratory distress  Abd: soft, nontender, nondistended, normal bowel tones.         Imaging:   CT abdomen and pelvis with contrast  IMPRESSION:  Compared to September 19, 2016, similar mucosal thickening of the proximal jejenum   and distal ileum. There is decreased mesenteric and perienteric stranding.   Differential considerations remain chronic enteritis from infectious or   inflammatory etiologies, or malabsorption.     Assessment/Plan:  Dwane was seen today for medication  management.    Diagnoses and all orders for this visit:    Diarrhea, unspecified type: chronic diarrhea and hx of ? fecal incontinence prompted work up w/ GI notable for elevated CRP, CT scan with inflammatory changes in bowel.  Multiple attempts for colonoscopy in past but never able to obtain.  Concern for underlying IBD.  Repeat CT scan was ordered to assess if inflammatory changes seen previously still present -- scan with ongoing thickening of the prox jejunum and distal ileum.  In light of persistent changes > 1 year think that a colonoscopy is warranted.  Missed his GI appt so have rescheduled today--will communicate with his Case manager about this appointment to ensure he is able to attend.    -     Comprehensive Metabolic Panel  -     CBC with Differential  -     CRP, high sensitivity  - GI appt rescheduled to discuss colonoscopy     Hyperlipidemia, unspecified hyperlipidemia type  -     pravastatin 40 MG tablet;  Take 1 tablet (40 mg) by mouth every evening.    Type 2 diabetes mellitus with complication (HCC)  -     Hemoglobin A1c    HCM   -     influenza vaccine quadrivalent PF (adult) 0.5 mL IM - 76808          B12 deficiency: ddx includes metformin, notably EGD is pending with GI given hx of B12 deficiency to further eval.  Improved on oral B12 with resolution of his anemia, improvement of his WBC on last labs. Will repeat labs today.    - CBC with Differential    Type 2 diabetes mellitus with complication, without long-term current use of insulin ([place]): Excellent control on metformin.  Could consider decreasing dose in future.  Has had eye exams at OSH, will confirm with CM.   - Hemoglobin A1c  - Urine at next visit to eval for albuminuria   - Continue statin, HTN plan as below     HCM  -     influenza vaccine quadrivalent PF (adult) 0.5 mL IM - 81103    Leukopenia, unspecified type: previously has had low normal WBC for years, worsened last year and then improved following B12 supplementation,  indicating acute change was likely due to B12 def.  Smear showed no WBC abnormalities. Previously HIV, hep C negative. Ddx includes 2/2 med side effects (specifically valproate), may also be benign variant given chronically low normal values.   - CBC w/ diff    Schizophrenia: Continue follow up at [facility] with Margaretha Glassing  and his case manager [name].   -Continue psych medications per mental health provider ( per last records, palperidone IM q28 days and divalproex 750 daily)       Not discussed: asthma, GERD, B12 deficiency, HTN     Follow-up: 6 weeks with me; GI as above     Discussed with: Dr Ardelle Park

## 2018-02-24 LAB — HEMOGLOBIN A1C, HPLC: Hemoglobin A1C: 5.8 % (ref 4.0–6.0)

## 2018-03-04 ENCOUNTER — Telehealth (HOSPITAL_BASED_OUTPATIENT_CLINIC_OR_DEPARTMENT_OTHER): Payer: Self-pay | Admitting: Nursing

## 2018-03-04 NOTE — Telephone Encounter (Addendum)
Left a VM for pt's case Manager Sammuel Hines [phone] relaying below note.           ----- Message from Bubba Camp, MD sent at 03/04/2018  2:12 PM PST -----  Regarding: GI appointment  Hello!  I am trying to make sure that Mr. Quadros makes it to his GI appointment in December.  He has cognitive impairment and has missed several appts in the past since he has trouble remembering to go -- I tried to make the appointment while he waited, but he left before the appointment was confirmed.      I have tried to call both Mr. Reicher AND his case manager [name]) a few times but have not been able to speak with anyone -- was hoping we could speak with his case manager directly to confirm the GI appointment and make sure he gets there!!     Thanks so much!   Ander Purpura

## 2018-03-09 NOTE — Telephone Encounter (Signed)
-----   Message from Bubba Camp, MD sent at 03/04/2018  2:12 PM PST -----  Regarding: GI appointment  Hello!  I am trying to make sure that Mr. Helbert makes it to his GI appointment in December.  He has cognitive impairment and has missed several appts in the past since he has trouble remembering to go -- I tried to make the appointment while he waited, but he left before the appointment was confirmed.      I have tried to call both Mr. Delaguila AND his case manager [name]) a few times but have not been able to speak with anyone -- was hoping we could speak with his case manager directly to confirm the GI appointment and make sure he gets there!!     Thanks so much!   Ander Purpura

## 2018-03-09 NOTE — Telephone Encounter (Signed)
Multiple attempts to contact patient and case manger [name]. No answer. Left a VM.

## 2018-03-24 ENCOUNTER — Telehealth (HOSPITAL_BASED_OUTPATIENT_CLINIC_OR_DEPARTMENT_OTHER): Payer: Self-pay | Admitting: Unknown Physician Specialty

## 2018-03-24 ENCOUNTER — Encounter (HOSPITAL_BASED_OUTPATIENT_CLINIC_OR_DEPARTMENT_OTHER): Payer: No Typology Code available for payment source | Admitting: Physician Assistant

## 2018-03-24 NOTE — Telephone Encounter (Signed)
Tried to call Case Worker and patient again to remind of appt in GI today.  Left msg.

## 2018-04-02 ENCOUNTER — Telehealth (HOSPITAL_BASED_OUTPATIENT_CLINIC_OR_DEPARTMENT_OTHER): Payer: Self-pay

## 2018-04-02 NOTE — Telephone Encounter (Signed)
Called pt's case manager to try and reschedule appointment at [facility] due to pt has no showed to last two scheduled appointments.

## 2018-04-06 NOTE — Progress Notes (Signed)
ATTENDING NOTE  I have personally discussed the case with Lauren C Shih, MD during or immediately after the patient visit including review of history, physical exam, diagnosis, and treatment plan. I agree with the assessment and plan of care.

## 2018-06-19 ENCOUNTER — Other Ambulatory Visit (HOSPITAL_BASED_OUTPATIENT_CLINIC_OR_DEPARTMENT_OTHER): Payer: Self-pay | Admitting: Internal Medicine

## 2018-06-19 DIAGNOSIS — J309 Allergic rhinitis, unspecified: Secondary | ICD-10-CM

## 2018-06-19 DIAGNOSIS — E118 Type 2 diabetes mellitus with unspecified complications: Secondary | ICD-10-CM

## 2018-06-19 DIAGNOSIS — I10 Essential (primary) hypertension: Secondary | ICD-10-CM

## 2018-06-19 DIAGNOSIS — K219 Gastro-esophageal reflux disease without esophagitis: Secondary | ICD-10-CM

## 2018-06-22 MED ORDER — LISINOPRIL 40 MG OR TABS
ORAL_TABLET | ORAL | 5 refills | Status: DC
Start: 2018-06-22 — End: 2018-12-24

## 2018-06-22 MED ORDER — METFORMIN HCL 500 MG OR TABS
ORAL_TABLET | ORAL | 5 refills | Status: DC
Start: 2018-06-22 — End: 2018-12-24

## 2018-06-22 MED ORDER — POTASSIUM CHLORIDE ER 10 MEQ OR TBCR
EXTENDED_RELEASE_TABLET | ORAL | 5 refills | Status: DC
Start: 2018-06-22 — End: 2018-12-24

## 2018-06-22 MED ORDER — FAMOTIDINE 20 MG OR TABS
20.0000 mg | ORAL_TABLET | Freq: Two times a day (BID) | ORAL | 5 refills | Status: DC
Start: 2018-06-22 — End: 2018-12-24

## 2018-06-22 MED ORDER — LORATADINE 10 MG OR TABS
ORAL_TABLET | ORAL | 5 refills | Status: DC
Start: 2018-06-22 — End: 2018-12-24

## 2018-06-22 MED ORDER — HYDROCHLOROTHIAZIDE 25 MG OR TABS
ORAL_TABLET | ORAL | 5 refills | Status: DC
Start: 2018-06-22 — End: 2018-12-24

## 2018-07-10 ENCOUNTER — Telehealth (HOSPITAL_BASED_OUTPATIENT_CLINIC_OR_DEPARTMENT_OTHER): Payer: Self-pay

## 2018-07-10 NOTE — Telephone Encounter (Signed)
Left message for case worker [name] to offer a phone visit for pt on 07/13/18

## 2018-07-13 ENCOUNTER — Encounter (HOSPITAL_BASED_OUTPATIENT_CLINIC_OR_DEPARTMENT_OTHER): Payer: No Typology Code available for payment source

## 2018-07-13 ENCOUNTER — Encounter (HOSPITAL_BASED_OUTPATIENT_CLINIC_OR_DEPARTMENT_OTHER): Payer: Self-pay

## 2018-07-13 NOTE — Progress Notes (Signed)
Justin Zavala did not cancel and was not present for a scheduled appointment today.  Disposition: Chart reviewed, patient contacted by phone regarding follow-up

## 2018-07-30 ENCOUNTER — Telehealth (HOSPITAL_BASED_OUTPATIENT_CLINIC_OR_DEPARTMENT_OTHER): Payer: Self-pay | Admitting: Obstetrics/Gynecology

## 2018-07-30 NOTE — Telephone Encounter (Signed)
RN received call from Cabin crew) who stated that Justin Zavala has been decompensating, appt 07/13/18 was cancelled. Possibly referred to [facility], requesting have provider call to discuss patient status.     RN advised note would be forwarded to PCP.

## 2018-07-30 NOTE — Progress Notes (Signed)
Talked with patient's case manager, Justin Zavala, today.     He states that patient has been decompensating significantly over the past several months.  He has had issues in his apartment building and  has had more aggressive behavior towards his sister (who lives in the same building he does).  His nephew has had to intervene during these episodes.     His family thinks he has lost weight.  He has appeared disheveled and had poor hygiene.     His case manager visited him this week; Justin Zavala told his CM that he was taking his medications, but CM did not think he was at his baseline.  He does have ongoing assistance from his care aide; had groceries in the house.  They are planning to file a non emergency DCR request.     His case manager will continue to follow weekly.     Justin Zavala has missed multiple appointments in our clinic and with GI over the past several months.     Plan:   - Will try to arrange telehealth or phone visit with Justin Zavala and his CM for the next month   - CM will continue to work on referral to crisis team

## 2018-07-31 ENCOUNTER — Telehealth (HOSPITAL_BASED_OUTPATIENT_CLINIC_OR_DEPARTMENT_OTHER): Payer: Self-pay | Admitting: Unknown Physician Specialty

## 2018-07-31 NOTE — Telephone Encounter (Signed)
Left vm asking [name], care manager to call [phone] option [phone] to schedule phone/telemed apt

## 2018-08-04 ENCOUNTER — Other Ambulatory Visit (HOSPITAL_BASED_OUTPATIENT_CLINIC_OR_DEPARTMENT_OTHER): Payer: Self-pay | Admitting: Internal Medicine

## 2018-08-04 DIAGNOSIS — J45909 Unspecified asthma, uncomplicated: Secondary | ICD-10-CM

## 2018-08-05 MED ORDER — ALBUTEROL SULFATE HFA 108 (90 BASE) MCG/ACT IN AERS
INHALATION_SPRAY | RESPIRATORY_TRACT | 2 refills | Status: DC
Start: 2018-08-05 — End: 2018-11-11

## 2018-11-09 ENCOUNTER — Other Ambulatory Visit (HOSPITAL_BASED_OUTPATIENT_CLINIC_OR_DEPARTMENT_OTHER): Payer: Self-pay | Admitting: Unknown Physician Specialty

## 2018-11-09 DIAGNOSIS — J45909 Unspecified asthma, uncomplicated: Secondary | ICD-10-CM

## 2018-11-11 MED ORDER — ALBUTEROL SULFATE HFA 108 (90 BASE) MCG/ACT IN AERS
INHALATION_SPRAY | RESPIRATORY_TRACT | 1 refills | Status: DC
Start: 2018-11-11 — End: 2019-03-12

## 2018-11-11 NOTE — Telephone Encounter (Signed)
Patient last seen on 02/23/2018 and was to return in [place].  One refill authorized.  Please schedule follow up visit.

## 2018-12-22 ENCOUNTER — Other Ambulatory Visit (HOSPITAL_BASED_OUTPATIENT_CLINIC_OR_DEPARTMENT_OTHER): Payer: Self-pay | Admitting: Unknown Physician Specialty

## 2018-12-22 DIAGNOSIS — K219 Gastro-esophageal reflux disease without esophagitis: Secondary | ICD-10-CM

## 2018-12-22 DIAGNOSIS — J309 Allergic rhinitis, unspecified: Secondary | ICD-10-CM

## 2018-12-22 DIAGNOSIS — I10 Essential (primary) hypertension: Secondary | ICD-10-CM

## 2018-12-22 DIAGNOSIS — E118 Type 2 diabetes mellitus with unspecified complications: Secondary | ICD-10-CM

## 2018-12-24 MED ORDER — FAMOTIDINE 20 MG OR TABS
ORAL_TABLET | ORAL | 0 refills | Status: DC
Start: 2018-12-24 — End: 2019-02-02

## 2018-12-24 MED ORDER — METFORMIN HCL 1000 MG OR TABS
ORAL_TABLET | ORAL | 0 refills | Status: DC
Start: 2018-12-24 — End: 2019-02-02

## 2018-12-24 MED ORDER — LORATADINE 10 MG OR TABS
ORAL_TABLET | ORAL | 0 refills | Status: DC
Start: 2018-12-24 — End: 2019-02-02

## 2018-12-24 MED ORDER — HYDROCHLOROTHIAZIDE 25 MG OR TABS
ORAL_TABLET | ORAL | 0 refills | Status: DC
Start: 2018-12-24 — End: 2019-02-02

## 2018-12-24 MED ORDER — POTASSIUM CHLORIDE CRYS ER 10 MEQ OR TBCR
EXTENDED_RELEASE_TABLET | ORAL | 0 refills | Status: DC
Start: 2018-12-24 — End: 2019-02-02

## 2018-12-24 MED ORDER — LISINOPRIL 40 MG OR TABS
ORAL_TABLET | ORAL | 0 refills | Status: DC
Start: 2018-12-24 — End: 2019-02-02

## 2019-01-01 ENCOUNTER — Other Ambulatory Visit (HOSPITAL_BASED_OUTPATIENT_CLINIC_OR_DEPARTMENT_OTHER): Payer: Self-pay | Admitting: Internal Medicine

## 2019-01-01 ENCOUNTER — Other Ambulatory Visit (HOSPITAL_BASED_OUTPATIENT_CLINIC_OR_DEPARTMENT_OTHER): Payer: Self-pay | Admitting: Unknown Physician Specialty

## 2019-01-01 DIAGNOSIS — D649 Anemia, unspecified: Secondary | ICD-10-CM

## 2019-01-01 DIAGNOSIS — I10 Essential (primary) hypertension: Secondary | ICD-10-CM

## 2019-01-04 ENCOUNTER — Encounter (HOSPITAL_BASED_OUTPATIENT_CLINIC_OR_DEPARTMENT_OTHER): Payer: No Typology Code available for payment source | Admitting: Unknown Physician Specialty

## 2019-01-04 ENCOUNTER — Other Ambulatory Visit (HOSPITAL_BASED_OUTPATIENT_CLINIC_OR_DEPARTMENT_OTHER): Payer: Self-pay | Admitting: Internal Medicine

## 2019-01-04 DIAGNOSIS — D649 Anemia, unspecified: Secondary | ICD-10-CM

## 2019-01-06 NOTE — Telephone Encounter (Signed)
See 01/01/19 encounter

## 2019-02-01 ENCOUNTER — Other Ambulatory Visit (HOSPITAL_BASED_OUTPATIENT_CLINIC_OR_DEPARTMENT_OTHER): Payer: Self-pay | Admitting: Unknown Physician Specialty

## 2019-02-01 DIAGNOSIS — I10 Essential (primary) hypertension: Secondary | ICD-10-CM

## 2019-02-01 DIAGNOSIS — K219 Gastro-esophageal reflux disease without esophagitis: Secondary | ICD-10-CM

## 2019-02-01 DIAGNOSIS — E118 Type 2 diabetes mellitus with unspecified complications: Secondary | ICD-10-CM

## 2019-02-01 DIAGNOSIS — J309 Allergic rhinitis, unspecified: Secondary | ICD-10-CM

## 2019-02-02 MED ORDER — FAMOTIDINE 20 MG OR TABS
ORAL_TABLET | ORAL | 0 refills | Status: DC
Start: 2019-02-02 — End: 2019-03-03

## 2019-02-02 MED ORDER — LORATADINE 10 MG OR TABS
ORAL_TABLET | ORAL | 0 refills | Status: DC
Start: 2019-02-02 — End: 2019-03-03

## 2019-02-02 MED ORDER — METFORMIN HCL 1000 MG OR TABS
ORAL_TABLET | ORAL | 0 refills | Status: DC
Start: 2019-02-02 — End: 2019-03-03

## 2019-02-02 MED ORDER — HYDROCHLOROTHIAZIDE 25 MG OR TABS
ORAL_TABLET | ORAL | 0 refills | Status: DC
Start: 2019-02-02 — End: 2019-03-03

## 2019-02-02 MED ORDER — LISINOPRIL 40 MG OR TABS
ORAL_TABLET | ORAL | 0 refills | Status: DC
Start: 2019-02-02 — End: 2019-02-19

## 2019-02-02 MED ORDER — POTASSIUM CHLORIDE CRYS ER 10 MEQ OR TBCR
EXTENDED_RELEASE_TABLET | ORAL | 0 refills | Status: DC
Start: 2019-02-02 — End: 2019-02-19

## 2019-02-02 NOTE — Telephone Encounter (Signed)
Please see 07/30/2018 TE; plan was to schedule patient for telemed or phone visit in a month.    One month authorized.  Please schedule an appointment with Dr. Bobbye Charleston.

## 2019-02-14 NOTE — Progress Notes (Signed)
Adult Medicine Clinic- Telemedicine Visit    Date: 02/14/2019    ID/CC:  Medication management and refills     Subjective/Interval History:  I last saw patient on 02/2018, at which time we were working to reschedule/coordinate GI work up and referral for chronic diarrrhea and hx of CT scan with inflammatory changes.      In the interim:   - Missed appts in clinic and GI; difficult to get patient in for sooner visit this Spring given COVID pandemic   - Was unable to get colonoscopy done   - Last discussed with CM 4/20; planning for [facility] referral at that time due to decompensation     Today, patient is present with his case manager, [name].  Justin Zavala states he is "doing great".  He is still living independently in his apartment and visits with [name] a few times a month (less with COVID as the community office is closed to clients).  His sister [name] (lives in [place]) and [place] (lives across courtyard) are both involved in [place] with shopping, appointments.  He states he is doing "OK" with COVID but has to stay inside which is hard.     Avier states initially that he has not noticed any diarrhea lately.  He denies any blood in his stool or abdominal pain.  However, he later states he sometimes has loose stool, but only 1 BM /day.  Does not recall accidents the way he had before.  Has a good appetite.      [name] shares that April-June was very hard for Collyn, and that he seemed to have a psychiatric decompensation.  They are unsure if it was lack of social contact/distancing, or something else that triggered it.  He was working with the crisis team.  He had some compulsive behaviors (walking to office) that were present as well.  Lost weight -- estimated 20 pounds, but now gaining back.  He is doing better overall now, but unsure what exactly was happening.      [name] thinks Romio' sister may be able to be involved in helping coordinate GI work up in the future.     Medications, including long acting  injectable antipsychotic, were confirmed with patrick and updated in Epic. He receives his psychiatric medications via [facility].      Current Outpatient Medications   Medication Sig Dispense Refill   . albuterol HFA 108 (90 Base) MCG/ACT inhaler INHALE 2 PUFFS BY MOUTH EVERY SIX HOURS AS NEEDED FOR SHORTNESS OF BREATH 8.5 g 1   . Cyanocobalamin 1000 MCG Oral Tablet Take 1 tablet (1,000 mcg) by mouth daily. 30 tablet 6   . Divalproex Sodium 250 MG Oral Tab EC Take 750 mg by mouth at bedtime.     . famotidine 20 MG tablet TAKE 1 TABLET BY MOUTH TWICE A DAY 56 tablet 0   . hydroCHLOROthiazide 25 MG tablet TAKE 1 TABLET BY MOUTH DAILY 28 tablet 0   . lisinopril 40 MG tablet TAKE 1 TABLET BY MOUTH DAILY (40 MG) 28 tablet 0   . loratadine 10 MG tablet TAKE 1 TABLET BY MOUTH DAILY 28 tablet 0   . metFORMIN 1000 MG tablet TAKE 1 TABLET BY MOUTH TWICE A DAY 56 tablet 0   . Paliperidone Palmitate 234 MG/1.5ML Intramuscular Suspension Inject 234 mg intramuscularly every 4 weeks.     . potassium chloride ER 10 MEQ ER tablet TAKE 2 TABLETS BY MOUTH DAILY 56 tablet 0   . pravastatin 40  MG tablet Take 1 tablet (40 mg) by mouth every evening. 30 tablet 11     No current facility-administered medications for this visit.        ALLERGIES:  Review of patient's allergies indicates:  No Known Allergies    PAST MEDICAL/SURGICAL HISTORY:  Reviewed in Epic    Objective:  There were no vitals taken for this visit.     Wt Readings from Last 3 Encounters:   02/23/18 198 lb (89.8 kg)   12/31/17 193 lb (87.5 kg)   06/17/17 195 lb 1.7 oz (88.5 kg)      BP Readings from Last 3 Encounters:   02/23/18 134/45   12/31/17 (!) 146/57   06/17/17 123/56       Gen: Well appearing, in NAD.  Appears to be neatly dressed and groomed, sitting comfortably in chair  Psych: interactive with me via webcam and answers questions with brief, but appropriate responses.  Paucity of speech     Assessment/Plan:    Bowel Habit changes, hx of chronic diarrhea: chronic diarrhea  and hx of ? fecal incontinence prompted work up w/ GI notable for elevated CRP, CT scan with inflammatory changes in bowel.  Multiple attempts for colonoscopy in past but never able to obtain.  Concern for underlying IBD.  Repeat CT scan was ordered to assess if inflammatory changes seen previously still present -- scan with ongoing thickening of the prox jejunum and distal ileum.  In light of persistent changes > 1 year think that a colonoscopy is warranted.  Has had a lot of difficulty with coordinating colonoscopy with GI given patient's developmental delay and complexity of colonoscopy prep.  Discussed with [name] today, and he thinks his family would be able to help with care coordination.  Notably, although patient does not endorse significant symptoms right now, difficult to assess as he is a difficult historian.  Regardless of symptoms, think that given prior imaging and lab findings + age, a colonoscopy is reasonable.   - Will discuss with GI about re-eval in clinic to try to help coordinate colonoscopy (and EGD as below).  Let Jeneen Rinks and [name] know that I think first step in getting colonoscopy will be a repeat clinic eval   - Will obtain labs (CBC) at next visit; weight at in person visit    T2DM: due for labs. Could consider decreasing metformin in future pending labs. Has had eye exams at OSH previously, but not recently.   - Continue metformin 1000 BID   - Continue pravastatin 40 mg daily   - Labs at in person visit: A1c, BMP, urine microalbumin screen  - Due for eye and foot screen at follow up in person     HTN: uncertain control at this point; no home data   - Will check BP at in person visit  - Continue HCTZ, lisinopril, Kcl  - BMP to check lytes at in person follow up     Schizophrenia, developmental delay:Continue follow up at [facility] and his case [place].   -Continue psych medications per mental health provider (palperidone IM q28 days and divalproex 750 daily)      HCM:   - Will give flu shot at in person follow up   - Due for shingrix as well; will prioritize flu shot for now     Not discussed:   Asthma: albuterol PRN    Gerd: Famotidine   Hx B12 deficiency: ddx includes metformin, notably EGD is pending with GI  given hx of B12 deficiency to further eval. Anemia and leukopenia improved with B12 supplementation  - CBC with next labs     Leukopenia, unspecified type: previously has had low normal WBC for years,worsened last year and then improved following B12 supplementation, indicating acute change was likely due to B12 def. Smear showed no WBC abnormalities. Previously HIV, hep C negative. Ddx includes 2/2 med side effects (specifically valproate), may also be benign variant given chronically low normal values.  - CBC w/ diff    Follow up: 2 weeks in person to help with flu shot, retinal photos, labs, GI care coordination     I spent a total time of 20 minutes face-to-face with the patient, of which more than 50% was spent counseling and coordinating care as outlined in this note.    Distant Site Telemedicine Encounter    I conducted this encounter from Home office via secure, live, face-to-face video conference with the patient. Jaydee was located at Home with  his case Freight forwarder, [name].  Prior to the interview, the risks and benefits of telemedicine were discussed with the patient and verbal consent was obtained.

## 2019-02-15 ENCOUNTER — Ambulatory Visit (HOSPITAL_BASED_OUTPATIENT_CLINIC_OR_DEPARTMENT_OTHER)
Payer: No Typology Code available for payment source | Attending: Unknown Physician Specialty | Admitting: Unknown Physician Specialty

## 2019-02-15 DIAGNOSIS — R194 Change in bowel habit: Secondary | ICD-10-CM | POA: Insufficient documentation

## 2019-02-17 ENCOUNTER — Other Ambulatory Visit (HOSPITAL_BASED_OUTPATIENT_CLINIC_OR_DEPARTMENT_OTHER): Payer: Self-pay | Admitting: Unknown Physician Specialty

## 2019-02-17 DIAGNOSIS — I10 Essential (primary) hypertension: Secondary | ICD-10-CM

## 2019-02-19 MED ORDER — LISINOPRIL 40 MG OR TABS
ORAL_TABLET | ORAL | 0 refills | Status: DC
Start: 2019-02-19 — End: 2019-03-03

## 2019-02-19 MED ORDER — POTASSIUM CHLORIDE ER 10 MEQ OR TBCR
EXTENDED_RELEASE_TABLET | ORAL | 0 refills | Status: DC
Start: 2019-02-19 — End: 2019-03-03

## 2019-02-25 ENCOUNTER — Telehealth (HOSPITAL_BASED_OUTPATIENT_CLINIC_OR_DEPARTMENT_OTHER): Payer: No Typology Code available for payment source | Admitting: Physician Assistant

## 2019-02-25 ENCOUNTER — Ambulatory Visit (HOSPITAL_BASED_OUTPATIENT_CLINIC_OR_DEPARTMENT_OTHER): Payer: No Typology Code available for payment source | Attending: Physician Assistant | Admitting: Physician Assistant

## 2019-02-25 DIAGNOSIS — R935 Abnormal findings on diagnostic imaging of other abdominal regions, including retroperitoneum: Secondary | ICD-10-CM

## 2019-02-25 DIAGNOSIS — R197 Diarrhea, unspecified: Secondary | ICD-10-CM | POA: Insufficient documentation

## 2019-02-25 NOTE — Progress Notes (Addendum)
Telephone Visit    This clinic visit was converted to a telephone visit due to the current COVID-19 pandemic to minimize the spread of the virus, conserve PPE and prioritize the safety of patients and staff.    This visit is being conducted over the telephone at the patient's request: Yes  Patient gives verbal consent to proceed and knows there may be a copay/deductible: Yes    Time spent with patient/guardian on this telephone visit: 25 minutes    CHIEF COMPLAINT: Diarrhea    HPI:   Last clinic visit: 06/17/17  Initially seen in GI clinic on 10/17/16    Justin Zavala is a 53 year old male with developmental delay, schizophrenia and T2DM who presents to the outpatient Gastroenterology clinic for follow-up. Was referred to GI clinic after CT scan performed June 2018 showed moderate circumferential thickening of jejunum and fluid-filled portions of ileum. At the time of our initial visit, I recommended EGD/colonoscopy for further work-up of chronic diarrhea in light of CT findings and also for evaluation of low vitamin B12. Unfortunately we've had difficulty getting this patient scheduled for his procedures. At our last meeting in March 2019, patient/case manager agreed that diarrhea had improved and that procedures would be difficult given his ability to prep and poor family support. Instead we agreed to perform repeat CT scan and fecal calprotectin. Stool test was never completed, and CT scan performed 01/2018 showed similar mucosal thickening of the proximal jejunum and distal ileum with decreased mesenteric and perienteric stranding.     During today's visit, Justin Zavala is present for our conversation and helps to give the history at times. At times Justin Zavala's history is contradictory and confusing. We discuss the following:  - Usually stool is loose or watery. Has a BM in the morning and goes after eating a meal. When asked if he can have 10 BM/day he says "yes."  - Sometimes doesn't want to get up to have a  BM and will have accidents in his underwear.     GI History  Use of NSAID's: NO  GI PMH: NEGATIVE - cholecystitis, cholelithiasis, choledocholithiasis, previous documented ulcer or H. Pylori, abdominal surgeries, IBS, IBD   Diet: did not assess in detail   Alcohol consumption:  NO  Tobacco: NO  Marijuana: NO  Illicit substances: NO   Family history of GI malignancies: NO  Recent labs: N/A  Endoscopies: N/A  Imaging:   - 01/16/18 CT A/P - compared to September 19, 2016, similar mucosal thickening of the proximal jejenum and distal ileum, decreased mesenteric and perienteric stranding  - 09/19/16 CT A/P - moderate circumferential thickening of the jejunum and fluid-filled portions of the ileum, all consistent with enteritis (chronic or acute on chronic) or transmural infiltration by another entity such as autoimmune or inflammatory disease, low-grade lymphoma such as MALT is not excluded    OTHER MEDICAL HISTORY    MEDICATIONS:  Current Outpatient Medications   Medication Sig Dispense Refill   . albuterol HFA 108 (90 Base) MCG/ACT inhaler INHALE 2 PUFFS BY MOUTH EVERY SIX HOURS AS NEEDED FOR SHORTNESS OF BREATH 8.5 g 1   . Cyanocobalamin 1000 MCG Oral Tablet Take 1 tablet (1,000 mcg) by mouth daily. 30 tablet 6   . Divalproex Sodium 250 MG Oral Tab EC Take 750 mg by mouth at bedtime.     . famotidine 20 MG tablet TAKE 1 TABLET BY MOUTH TWICE A DAY 56 tablet 0   . hydroCHLOROthiazide 25 MG tablet TAKE  1 TABLET BY MOUTH DAILY 28 tablet 0   . lisinopril 40 MG tablet TAKE 1 TABLET BY MOUTH DAILY 28 tablet 0   . loratadine 10 MG tablet TAKE 1 TABLET BY MOUTH DAILY 28 tablet 0   . metFORMIN 1000 MG tablet TAKE 1 TABLET BY MOUTH TWICE A DAY 56 tablet 0   . Paliperidone Palmitate 234 MG/1.5ML Intramuscular Suspension Inject 234 mg intramuscularly every 4 weeks.     . potassium chloride ER 10 MEQ ER tablet TAKE 2 TABLETS BY MOUTH DAILY 28 tablet 0   . pravastatin 40 MG tablet Take 1 tablet (40 mg) by mouth every evening. 30 tablet  11     No current facility-administered medications for this visit.        ALLERGIES:  Review of patient's allergies indicates:  No Known Allergies    PAST MEDICAL AND SURGICAL HISTORY:  Past Medical History:   Diagnosis Date   . Allergic rhinitis, cause unspecified    . Chronic obstructive asthma, unspecified ([place])    . Diabetes mellitus ([place])    . Lipidoses    . Obesity, unspecified    . 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 Other specified types of schizophrenia, unspecified condition ([place])    . Unspecified essential hypertension      No past surgical history on file.    SOCIAL HISTORY:  Tobacco:   Social History     Tobacco Use   Smoking Status Never Smoker   Smokeless Tobacco Never Used     EtOH:   Social History     Substance and Sexual Activity   Alcohol Use No       FAMILY HISTORY:   Family History     Problem (# of Occurrences) Relation (Name,Age of Onset)    Cancer (1) Mother    Diabetes (1) Mother    Mental Illness (1) Sister    Stroke (1) Mother: 2012 stroke          ROS: Positives and pertinent negatives per interval history; otherwise, a complete ROS is negative.      PHYSICAL EXAM:   Not conducted during telephone visit.     LABORATORY DATA:  Please see patient's medical record for a full review of past laboratory data. Studies reviewed from the past year and were non-contributory to this visit.    RADIOLOGIC STUDIES:  01/16/18 CT A/P  IMPRESSION:  Compared to September 19, 2016, similar mucosal thickening of the proximal jejenum and distal ileum. There is decreased mesenteric and perienteric stranding. Differential considerations remain chronic enteritis from infectious or inflammatory etiologies, or malabsorption.    09/19/16 CT A/P  IMPRESSION:  Sections of moderate circumferential thickening of the jejunum and fluid-filled portions of the ileum, all consistent with enteritis (chronic or acute on chronic) or transmural infiltration by another entity such as autoimmune or inflammatory disease.  Findings are not typical for lymphoma however low-grade lymphoma such as MALT is not excluded. No neoplasm elsewhere. No other findings to explain clinical symptoms.     ENDOSCOPIES:  N/A    IMPRESSION:   Patient is a 53 yo male with developmental delay, schizophrenia and T2DM here for follow-up. Was referred to GI clinic after CT scan performed June 2018 showed moderate circumferential thickening of jejunum and fluid-filled portions of ileum. At the time of our initial visit, I recommended EGD/colonoscopy for further work-up of chronic diarrhea in light of CT findings and also for evaluation of low vitamin B12. Unfortunately  we've had difficulty getting this patient scheduled for his procedures. At our last meeting in March 2019, patient/case manager agreed that diarrhea had improved and that procedures would be difficult given his ability to prep and poor family support. Instead we agreed to perform repeat CT scan and fecal calprotectin. Stool test was never completed, and CT scan performed 01/2018 showed similar mucosal thickening of the proximal jejunum and distal ileum with decreased mesenteric and perienteric stranding.     He is a poor historian but seems to suffer from diarrhea and fecal incontinence although no abdominal pain or BRBPR. If patent were to have IBD, his course doesn't seem to be too severe, but we should attempt to rule this out as well as any other pathology. Will plan to perform EGD/colonoscopy. Patient will need the assistance of sister, Winsor Ess, to help with complying with prep instructions, COVID testing prior and escort after procedure. Will contact her to make sure she's on board with all of this.     Differential diagnoses considered include infectious colitis, IBD, malabsorption, IBS, medication side effect, malignancy      RECOMMENDATIONS:   The encounter diagnosis was Diarrhea, unspecified type.    Based on this we formulated the following plan:    1. Recommend evaluation with  EGD/colonoscopy   2. Follow up in Gastroenterology clinic after procedures to discuss results.     I spent a total time of 25 minutes in discussion with the patient, of which more than 50% was spent counseling as outlined in this note.    ADDENDUM:  03/17/19 Spoke with IBD Attending regarding this case. Given prior small bowel inflammation, will plan for EGD/colonoscopy +/- capsule endoscopy instead. If obvious inflammation found on colonoscopy could potentially skip the capsule but if not we will try to complete all procedures at one time.

## 2019-02-28 NOTE — Progress Notes (Deleted)
Adult Medicine Clinic- Return Visit    Date: 02/28/2019      ID/CC:      Interpreter present:       Subjective/Interval History:  I last saw patient on ***, at which time ***    In the interim:   -Telemedicine appointment with GI 11/19: plan to obtain EGD/colon with assistance of sister with Prep.  GI helping to coordinate this as an outpatient     Objective:  There were no vitals taken for this visit.     Wt Readings from Last 3 Encounters:   02/23/18 198 lb (89.8 kg)   12/31/17 193 lb (87.5 kg)   06/17/17 195 lb 1.7 oz (88.5 kg)      BP Readings from Last 3 Encounters:   02/23/18 134/45   12/31/17 (!) 146/57   06/17/17 123/56       Gen:  Pulm:       Results for orders placed or performed in visit on 02/23/18   Comprehensive Metabolic Panel   Result Value Ref Range    Sodium 137 135 - 145 meq/L    Potassium 3.8 3.6 - 5.2 meq/L    Chloride 100 98 - 108 meq/L    Carbon Dioxide, Total 28 22 - 32 meq/L    Anion Gap 9 4 - 12    Glucose 116 62 - 125 mg/dL    Urea Nitrogen 15 8 - 21 mg/dL    Creatinine 1.15 0.51 - 1.18 mg/dL    Protein (Total) 7.5 6.0 - 8.2 g/dL    Albumin 4.8 3.5 - 5.2 g/dL    Bilirubin (Total) 1.2 0.2 - 1.3 mg/dL    Calcium 10.2 8.9 - 10.2 mg/dL    AST (GOT) 23 9 - 38 U/L    Alkaline Phosphatase (Total) 120 39 - 139 U/L    ALT (GPT) 24 10 - 64 U/L    GFR, Calc, European American >60 >59 mL/min/[1.73_m2]    GFR, Calc, African American >60 >59 mL/min/[1.73_m2]    GFR, Information       Calculated GFR in mL/min/1.73 m2 by MDRD equation.  Inaccurate with changing renal function.  See [email or web address]   CBC with Differential   Result Value Ref Range    WBC 5.38 4.30 - 10.00 10*3/uL    RBC 4.88 4.40 - 5.60 10*6/uL    Hemoglobin 14.7 13.0 - 18.0 g/dL    Hematocrit 43 38 - 50 %    MCV 88 81 - 98 fL    MCH 30.1 27.3 - 33.6 pg    MCHC 34.2 32.2 - 36.5 g/dL    Platelet Count 212 150 - 400 10*3/uL    RDW-CV 13.0 11.6 - 14.4 %    % Neutrophils 62 %    % Lymphocytes 28 %    %  Monocytes 9 %    % Eosinophils 1 %    % Basophils 0 %    % Immature Granulocytes 0 %    Neutrophils 3.29 1.80 - 7.00 10*3/uL    Absolute Lymphocyte Count 1.52 1.00 - 4.80 10*3/uL    Monocytes 0.50 0.00 - 0.80 10*3/uL    Absolute Eosinophil Count 0.04 0.00 - 0.50 10*3/uL    Basophils 0.02 0.00 - 0.20 10*3/uL    Immature Granulocytes 0.01 0.00 - 0.05 10*3/uL    Nucleated RBC 0.00 0.00 10*3/uL    % Nucleated RBC 0 %   CRP, high sensitivity   Result Value Ref Range    C_Reactive  Protein 1.2 0.0 - 10.0 mg/L   Hemoglobin A1c   Result Value Ref Range    Hemoglobin A1C 5.8 4.0 - 6.0 %         Assessment/Plan:  There are no diagnoses linked to this encounter.        Follow-up: ***    Discussed with: ***

## 2019-03-01 ENCOUNTER — Other Ambulatory Visit (HOSPITAL_BASED_OUTPATIENT_CLINIC_OR_DEPARTMENT_OTHER): Payer: Self-pay | Admitting: Unknown Physician Specialty

## 2019-03-01 ENCOUNTER — Encounter (HOSPITAL_BASED_OUTPATIENT_CLINIC_OR_DEPARTMENT_OTHER): Payer: No Typology Code available for payment source | Admitting: Unknown Physician Specialty

## 2019-03-01 DIAGNOSIS — I10 Essential (primary) hypertension: Secondary | ICD-10-CM

## 2019-03-01 DIAGNOSIS — K219 Gastro-esophageal reflux disease without esophagitis: Secondary | ICD-10-CM

## 2019-03-01 DIAGNOSIS — J309 Allergic rhinitis, unspecified: Secondary | ICD-10-CM

## 2019-03-01 DIAGNOSIS — E118 Type 2 diabetes mellitus with unspecified complications: Secondary | ICD-10-CM

## 2019-03-03 ENCOUNTER — Other Ambulatory Visit (HOSPITAL_BASED_OUTPATIENT_CLINIC_OR_DEPARTMENT_OTHER): Payer: Self-pay | Admitting: Unknown Physician Specialty

## 2019-03-03 DIAGNOSIS — E785 Hyperlipidemia, unspecified: Secondary | ICD-10-CM

## 2019-03-03 MED ORDER — LISINOPRIL 40 MG OR TABS
ORAL_TABLET | ORAL | 2 refills | Status: DC
Start: 2019-03-03 — End: 2019-03-12

## 2019-03-03 MED ORDER — FAMOTIDINE 20 MG OR TABS
ORAL_TABLET | ORAL | 2 refills | Status: DC
Start: 2019-03-03 — End: 2019-03-12

## 2019-03-03 MED ORDER — HYDROCHLOROTHIAZIDE 25 MG OR TABS
ORAL_TABLET | ORAL | 2 refills | Status: DC
Start: 2019-03-03 — End: 2019-03-12

## 2019-03-03 MED ORDER — POTASSIUM CHLORIDE ER 10 MEQ OR TBCR
EXTENDED_RELEASE_TABLET | ORAL | 2 refills | Status: DC
Start: 2019-03-03 — End: 2019-03-12

## 2019-03-03 MED ORDER — METFORMIN HCL 1000 MG OR TABS
ORAL_TABLET | ORAL | 2 refills | Status: DC
Start: 2019-03-03 — End: 2019-03-12

## 2019-03-03 MED ORDER — LORATADINE 10 MG OR TABS
ORAL_TABLET | ORAL | 2 refills | Status: DC
Start: 2019-03-03 — End: 2019-03-12

## 2019-03-08 ENCOUNTER — Ambulatory Visit (HOSPITAL_BASED_OUTPATIENT_CLINIC_OR_DEPARTMENT_OTHER)
Payer: No Typology Code available for payment source | Attending: Unknown Physician Specialty | Admitting: Unknown Physician Specialty

## 2019-03-08 VITALS — BP 157/57 | HR 72 | Temp 97.9°F | Wt 169.0 lb

## 2019-03-08 DIAGNOSIS — I10 Essential (primary) hypertension: Secondary | ICD-10-CM | POA: Insufficient documentation

## 2019-03-08 DIAGNOSIS — J45909 Unspecified asthma, uncomplicated: Secondary | ICD-10-CM | POA: Insufficient documentation

## 2019-03-08 DIAGNOSIS — E118 Type 2 diabetes mellitus with unspecified complications: Secondary | ICD-10-CM | POA: Insufficient documentation

## 2019-03-08 DIAGNOSIS — E119 Type 2 diabetes mellitus without complications: Secondary | ICD-10-CM | POA: Insufficient documentation

## 2019-03-08 DIAGNOSIS — D72819 Decreased white blood cell count, unspecified: Secondary | ICD-10-CM | POA: Insufficient documentation

## 2019-03-08 DIAGNOSIS — Z0000 Encounter for general adult medical examination without abnormal findings: Secondary | ICD-10-CM | POA: Insufficient documentation

## 2019-03-08 DIAGNOSIS — J309 Allergic rhinitis, unspecified: Secondary | ICD-10-CM | POA: Insufficient documentation

## 2019-03-08 DIAGNOSIS — R634 Abnormal weight loss: Secondary | ICD-10-CM | POA: Insufficient documentation

## 2019-03-08 DIAGNOSIS — E538 Deficiency of other specified B group vitamins: Secondary | ICD-10-CM | POA: Insufficient documentation

## 2019-03-08 DIAGNOSIS — L210 Seborrhea capitis: Secondary | ICD-10-CM | POA: Insufficient documentation

## 2019-03-08 DIAGNOSIS — Z23 Encounter for immunization: Secondary | ICD-10-CM | POA: Insufficient documentation

## 2019-03-08 DIAGNOSIS — L219 Seborrheic dermatitis, unspecified: Secondary | ICD-10-CM

## 2019-03-08 DIAGNOSIS — K219 Gastro-esophageal reflux disease without esophagitis: Secondary | ICD-10-CM | POA: Insufficient documentation

## 2019-03-08 LAB — HEPATIC FUNCTION PANEL
ALT (GPT): 21 U/L (ref 10–48)
AST (GOT): 25 U/L (ref 9–38)
Albumin: 4.2 g/dL (ref 3.5–5.2)
Alkaline Phosphatase (Total): 69 U/L (ref 39–139)
Bilirubin (Direct): 0.2 mg/dL (ref 0.0–0.3)
Bilirubin (Total): 1 mg/dL (ref 0.2–1.3)
Protein (Total): 6.5 g/dL (ref 6.0–8.2)

## 2019-03-08 LAB — BASIC METABOLIC PANEL
Anion Gap: 5 (ref 4–12)
Calcium: 9.7 mg/dL (ref 8.9–10.2)
Carbon Dioxide, Total: 30 meq/L (ref 22–32)
Chloride: 104 meq/L (ref 98–108)
Creatinine: 1.03 mg/dL (ref 0.51–1.18)
Glucose: 100 mg/dL (ref 62–125)
Potassium: 4.1 meq/L (ref 3.6–5.2)
Sodium: 139 meq/L (ref 135–145)
Urea Nitrogen: 19 mg/dL (ref 8–21)
eGFR by CKD-EPI: >60 mL/min/{1.73_m2} (ref >59)

## 2019-03-08 LAB — CBC, DIFF
% Basophils: 0 %
% Eosinophils: 2 %
% Immature Granulocytes: 0 %
% Lymphocytes: 32 %
% Monocytes: 13 %
% Neutrophils: 53 %
% Nucleated RBC: 0 %
Absolute Eosinophil Count: 0.07 10*3/uL (ref 0.00–0.50)
Absolute Lymphocyte Count: 1.39 10*3/uL (ref 1.00–4.80)
Basophils: 0.01 10*3/uL (ref 0.00–0.20)
Hematocrit: 37 % — ABNORMAL LOW (ref 38–50)
Hemoglobin: 12.3 g/dL — ABNORMAL LOW (ref 13.0–18.0)
Immature Granulocytes: 0.01 10*3/uL (ref 0.00–0.05)
MCH: 29.9 pg (ref 27.3–33.6)
MCHC: 33 g/dL (ref 32.2–36.5)
MCV: 91 fL (ref 81–98)
Monocytes: 0.58 10*3/uL (ref 0.00–0.80)
Neutrophils: 2.34 10*3/uL (ref 1.80–7.00)
Nucleated RBC: 0 10*3/uL
Platelet Count: 161 10*3/uL (ref 150–400)
RBC: 4.11 10*6/uL — ABNORMAL LOW (ref 4.40–5.60)
RDW-CV: 13.6 % (ref 11.6–14.4)
WBC: 4.4 10*3/uL (ref 4.30–10.00)

## 2019-03-08 LAB — ALBUMIN/CREATININE RATIO, RANDOM URINE
Albumin (Micro), URN: 0.84 mg/dL
Albumin/Creatinine Ratio, URN: 6 mg/g{creat} (ref <30)
Creatinine/Unit, URN: 143 mg/dL

## 2019-03-08 LAB — VITAMIN B12 (COBALAMIN): Vitamin B12 (Cobalamin): 239 pg/mL (ref 180–914)

## 2019-03-08 LAB — TSH WITH REFLEXIVE FREE T4: TSH with Reflexive Free T4: 1.696 u[IU]/mL (ref 0.400–5.000)

## 2019-03-08 MED ORDER — KETOCONAZOLE 2 % EX SHAM
MEDICATED_SHAMPOO | CUTANEOUS | 3 refills | Status: DC
Start: 2019-03-08 — End: 2020-07-21

## 2019-03-08 MED ORDER — PRAVASTATIN SODIUM 40 MG OR TABS
ORAL_TABLET | ORAL | 5 refills | Status: DC
Start: 2019-03-08 — End: 2019-03-12

## 2019-03-08 NOTE — Progress Notes (Signed)
Vaccine Screening Questions    Interpreter: No    1. Are you allergic to Latex? NO    2.  Have you had a serious reaction or an allergic reaction to a vaccine?  NO    3.  Currently have a moderate or severe illness, including fever?  NO    4.  Ever had a seizure or any neurological problem associated with a vaccine? (DTaP/TDaP/DTP pertinent) NO    5.  Is patient receiving any live vaccinations today? (Varicella-Chickenpox, MMR-Measles/Mumps/Rubella, Zoster-Shingles, Flumist, Yellow Fever) NOTE: oral rotavirus is exempt  NO    If YES to any of the questions above - Do NOT give vaccine.  Consult with RN or provider in clinic.  (#5 can be YES if all Live vaccine questions are answered NO)    If NO to all questions above - Patient may receive vaccine.    6.  Do you need to receive the Flu vaccine today? YES - Additional Flu Questions  Flu Vaccine Screening Questions:    Ever had a serious allergic reaction to eggs?  NO    Ever had Guillain-Barre syndrome associated with a vaccine? NO    Less than 6 months old? NO    If YES to any of the Flu questions above - NO Flu Vaccine to be given.  Patient may consult provider as needed.    If NO to all questions above - Patient may receive Flu Shot (IM)    Is the patient requesting Flumist? NO    If between 6 months and 8 years of age, was flu vaccine received last year?  N/A  If NO to above question:  . Children who are receiving influenza vaccine for the first time - administer 2 doses of the current influenza vaccine (separated by at least 4 weeks).      Patient tolerated well. Patient left without any complications.       All patients are encouraged to wait 15 minutes before leaving after receiving any vaccine.    VIS given 03/08/2019 by Ashley I Lowe, CMA.'

## 2019-03-08 NOTE — Patient Instructions (Addendum)
GI doctors will call you to schedule you for colonoscopy and EGD    Labs today--visit the ground floor    Flu shot today     Eye and foot check today

## 2019-03-08 NOTE — Progress Notes (Signed)
Adult Medicine Clinic- Return Visit    Date: 03/08/2019    ID/CC:  Labs and flu shot     Subjective/Interval History:  Patient is present with both his CM, [name], and his sister, [name], for today's appointment.     I last saw patient on 11/9 (telemedicine), at which time we worked on coordinating an appointment with GI.  We made plan for close follow up to give Dick a flu shot, repeat labs, and check BP.     In the interim:   - Telemed with GI 11/19: planning for EGD and colon but will need careful coordination with patient's sister (awaiting scheduling)    Today, Mouhamad states he has no new concerns.  He says nothing is bothering him.  States he is fine.  Isn't able to tell me details of the telemedicine visit he had.  Patient's sister and [name] help with some additional history:     BP: hasn't taken his meds today.  Sometimes misses all morning medications, didn't take his BP meds this morning since he was coming to the doctor though.  [name] helps with medications and keeping them organized.      Weight loss: Last weight in Epic is 200 pounds.  Wt today is 170.  CM thinks he actually lost more weight, but now has gained 10-20 pounds back.  [name] says his appetite is good. Isn't able to say more what cause his weight loss.     Mood:   [name] shares he thinks Kelley is "70% back to himself", doing better.  More engaged with his [facility] team, including [name].  They are not sure what caused his decompensation earlier this year, but they think he is doing better now. Continues to get his injections from [facility].     Dandruff: sister has noticed thick dandruff; barber did a lot of work to improve his scalp appearance (oil rubs, special shampoo).  They tried Head and Shoulders but wonder if there is something else they could try since the head and shoulders was not working well.     His other sister has COVID--Ichiro has not seen her lately (since prior to her diagnosis) and is distancing from her.  He denies any fevers,  chills, cough, SOB. Sister does not think he had any high risk exposures.     Objective:  BP (!) 157/57   Pulse 72   Temp 97.9 F (36.6 C) (Temporal)   Wt 169 lb (76.7 kg)   SpO2 99%   BMI 31.93 kg/m      Wt Readings from Last 3 Encounters:   03/08/19 169 lb (76.7 kg)   02/23/18 198 lb (89.8 kg)   12/31/17 193 lb (87.5 kg)      BP Readings from Last 3 Encounters:   03/08/19 (!) 157/57   02/23/18 134/45   12/31/17 (!) 146/57       Gen: Well appearing, in NAD  HEENT: no scleral icterus, does not follow directions for full testing but EOM appear grossly intact. Wearing mask  CV: RRR, ext warm and well perfused.  Trace edema LLE>RLE.    Pulm: CTAB, easy work of breathing   GI: +BS, soft, nontender, nondistended   Psych:  Awake, alert, answers questions with largely yes/no.  Pleasant, cooperative.  paucity of speech   Neuro: normal gait, no gross deficits     Foot exam:     - Visual inspection:  normal to inspection, with intact skin, no significant callus formation, no evidence  of ischemia   - Monofilament exam:  normal    - Pulse exam: normal    - Vibration 128-Hz tuning fork:  not done   - Pinprick sensation: not done   - Ankle reflexes:  not done      Assessment/Plan:  Martelle was seen today for follow-up .    Diagnoses and all orders for this visit:    Type 2 diabetes mellitus without complication ([place]): due for labs today.  Well controlled on metformin alone.  Has prior outside optho ([facility]) but is overdue for repeat exam and unsure if they perform dilated eye exams or just glasses eval.  Attempted to take screening retinopathy photos in clinic, but patient was unable to tolerate.   -     Hemoglobin A1c  -     URINE SCREEN, MICROALBUMINURIA  -     Cancel: DIABETIC RETINOPATHY SCREENING  - Referral to optho   -     FOOT EXAM W/MONOFILAMENT nml today   -  Cont metformin, pravastatin     Bowel Habit changes, hx of chronic diarrhea: chronic diarrhea and hx of ? fecal incontinence prompted work up w/ GI  notable for elevated CRP, CT scan with inflammatory changes in bowel. Multiple attempts for colonoscopy in past but never able to obtain. Concern for underlying IBD. Repeat CT scan was ordered to assess if inflammatory changes seen previously still present -- scan with ongoing thickening of the prox jejunum and distal ileum. In light of persistent changes >1 year think that a colonoscopy is warranted. Also has had weight loss of unclear etiology--unsure if related, but important to rule out IBD.   Notably, although patient does not endorse significant symptoms right now, difficult to assess as he is a difficult historian.  Regardless of symptoms, think that given prior imaging and lab findings + age, a colonoscopy is reasonable.     Has had a lot of difficulty with coordinating colonoscopy with GI given patient's developmental delay and complexity of colonoscopy prep.  Discussed with [name] and [name] today, who will assist with coordination. - Will discuss with GI about re-eval in clinic to try to help coordinate colonoscopy (and EGD as below).  Let Jeneen Rinks and [name] know that I think first step in getting colonoscopy will be a repeat clinic eval   - Will msg GI who is planning to help coordinate care contact info for Sair' sister, [phone]; GI to arrange colonoscopy per their last note     Weight loss: unclear etiology.  Case manager thinks he is actually gaining weight back after significant weight loss this summer.  In addition to eval with colonoscopy, will recheck CBC, CMP, TSH.  No other localizing symptoms at this time.  Reassuring that weight is reportedly stabilizing/improving.   -     Hepatic Function Panel, CBC, BMP, A1c  -     TSH with Reflexive Free T4    HTN: above goal today, but did not take meds.  No home values, but per CM, can start helping with checking when he gets scheduled long acting injectable antipsychotic.    - Refill HCTZ, lisinopril, Kcl  - BMP     Hx B12 deficiency: ddx  includes metformin, notably EGD is pending with GI given hx of B12 deficiency to further eval. Anemia and leukopenia improved with B12 supplementation  - CBC, B12 check   - Restart cyanocobalamin (has not been taking)     Leukopenia, unspecified type: previously has had  low normal WBC for years,worsened last year and then improved following B12 supplementation, indicating acute change was likely due to B12 def. Smear showed no WBC abnormalities. Previously HIV, hep C negative. Ddx includes 2/2 med side effects (specifically valproate), may also be benign variant given chronically low normal values.  - Repeat CBC today     HCM:   - Flu shot today   - Due for shingrix as well; will prioritize flu shot for now     Dandruff  -     ketoconazole 2 % shampoo; Apply topically 2 times a week. Apply to scalp when shampooing.      Not discussed:   #Schizophrenia, developmental delay:Continue follow up at [facility] and his case [place].   -Continue psych medications per mental health provider (palperidone IM q28 days and divalproex 750 daily)     #Asthma: albuterol PRN  refilled     #Gerd: Famotidine refilled     Follow-up: 2 months

## 2019-03-09 LAB — HEMOGLOBIN A1C, HPLC: Hemoglobin A1C: 5.7 % (ref 4.0–6.0)

## 2019-03-12 MED ORDER — POTASSIUM CHLORIDE ER 10 MEQ OR TBCR
20.0000 meq | EXTENDED_RELEASE_TABLET | Freq: Every day | ORAL | 11 refills | Status: DC
Start: 2019-03-12 — End: 2019-08-22

## 2019-03-12 MED ORDER — CYANOCOBALAMIN 1000 MCG OR TABS
1000.0000 ug | ORAL_TABLET | Freq: Every day | ORAL | 11 refills | Status: DC
Start: 2019-03-12 — End: 2020-03-09

## 2019-03-12 MED ORDER — PRAVASTATIN SODIUM 40 MG OR TABS
40.0000 mg | ORAL_TABLET | Freq: Every evening | ORAL | 11 refills | Status: DC
Start: 2019-03-12 — End: 2020-03-09

## 2019-03-12 MED ORDER — LISINOPRIL 40 MG OR TABS
ORAL_TABLET | ORAL | 11 refills | Status: DC
Start: 2019-03-12 — End: 2019-08-22

## 2019-03-12 MED ORDER — LORATADINE 10 MG OR TABS
ORAL_TABLET | ORAL | 11 refills | Status: DC
Start: 2019-03-12 — End: 2020-03-09

## 2019-03-12 MED ORDER — METFORMIN HCL 1000 MG OR TABS
1000.0000 mg | ORAL_TABLET | Freq: Two times a day (BID) | ORAL | 11 refills | Status: DC
Start: 2019-03-12 — End: 2020-03-09

## 2019-03-12 MED ORDER — FAMOTIDINE 20 MG OR TABS
20.0000 mg | ORAL_TABLET | Freq: Two times a day (BID) | ORAL | 11 refills | Status: DC
Start: 2019-03-12 — End: 2020-03-07

## 2019-03-12 MED ORDER — HYDROCHLOROTHIAZIDE 25 MG OR TABS
ORAL_TABLET | ORAL | 11 refills | Status: DC
Start: 2019-03-12 — End: 2020-03-09

## 2019-03-12 MED ORDER — ALBUTEROL SULFATE HFA 108 (90 BASE) MCG/ACT IN AERS
INHALATION_SPRAY | RESPIRATORY_TRACT | 6 refills | Status: DC
Start: 2019-03-12 — End: 2020-03-09

## 2019-03-15 ENCOUNTER — Encounter (HOSPITAL_BASED_OUTPATIENT_CLINIC_OR_DEPARTMENT_OTHER): Payer: Self-pay | Admitting: Unknown Physician Specialty

## 2019-03-17 NOTE — Addendum Note (Signed)
Addended by: Delrae Sawyers RENAE on: 03/17/2019 01:58 PM     Modules accepted: Orders

## 2019-05-31 ENCOUNTER — Encounter (INDEPENDENT_AMBULATORY_CARE_PROVIDER_SITE_OTHER): Payer: No Typology Code available for payment source | Admitting: Optometrist

## 2019-05-31 NOTE — Progress Notes (Deleted)
HgbA1c Lab    Lab Results   Component Value Date/Time    A1C 5.7 03/08/2019 12:38 PM    A1C 5.8 02/23/2018 03:30 PM    A1C 6.1 (H) 06/03/2017 02:37 PM

## 2019-07-09 ENCOUNTER — Other Ambulatory Visit (HOSPITAL_COMMUNITY): Payer: Self-pay | Admitting: Physician Assistant

## 2019-07-09 DIAGNOSIS — R935 Abnormal findings on diagnostic imaging of other abdominal regions, including retroperitoneum: Secondary | ICD-10-CM

## 2019-07-09 DIAGNOSIS — R197 Diarrhea, unspecified: Secondary | ICD-10-CM

## 2019-07-21 ENCOUNTER — Telehealth (HOSPITAL_BASED_OUTPATIENT_CLINIC_OR_DEPARTMENT_OTHER): Payer: Self-pay | Admitting: Unknown Physician Specialty

## 2019-07-21 NOTE — Telephone Encounter (Addendum)
Left VM for Case Manager regarding scheduling an appt, requested return call @ [facility]    ----- Message from Bubba Camp, MD sent at 07/20/2019  4:43 PM PDT -----  Regarding: Follow up  Hello,     I was hoping that we could reach out to this patient to offer him follow up in [facility]--he also needs a COVID vaccine if not already done (I can't find documentation about this, but I know there is sometimes a lag).  I could see him during a panel session, or I would be happy to see him on a Friday AM with the medical students when I am attending!    He has some intellectual disability, but his case manager should be able to help with appointments.     Thank you!Ander Purpura

## 2019-08-11 ENCOUNTER — Telehealth (HOSPITAL_BASED_OUTPATIENT_CLINIC_OR_DEPARTMENT_OTHER): Payer: Self-pay | Admitting: Unknown Physician Specialty

## 2019-08-11 NOTE — Telephone Encounter (Signed)
Called for primary care outreach--left message with patient's CM [name].  Requested callback to help schedule follow up in [facility] for Adonis; left contact info.

## 2019-08-20 ENCOUNTER — Other Ambulatory Visit (HOSPITAL_BASED_OUTPATIENT_CLINIC_OR_DEPARTMENT_OTHER): Payer: Self-pay | Admitting: Unknown Physician Specialty

## 2019-08-20 DIAGNOSIS — I10 Essential (primary) hypertension: Secondary | ICD-10-CM

## 2019-08-22 MED ORDER — LISINOPRIL 40 MG OR TABS
ORAL_TABLET | ORAL | 1 refills | Status: DC
Start: 2019-08-22 — End: 2019-08-24

## 2019-08-22 MED ORDER — POTASSIUM CHLORIDE ER 10 MEQ OR TBCR
EXTENDED_RELEASE_TABLET | ORAL | 1 refills | Status: DC
Start: 2019-08-22 — End: 2019-08-24

## 2019-08-22 NOTE — Telephone Encounter (Signed)
Patient last seen on 03/08/2019 for diabetes and was to return in 2 months.  One refill authorized.  Please schedule follow up visit.

## 2019-08-23 ENCOUNTER — Other Ambulatory Visit (HOSPITAL_BASED_OUTPATIENT_CLINIC_OR_DEPARTMENT_OTHER): Payer: Self-pay | Admitting: Unknown Physician Specialty

## 2019-08-23 DIAGNOSIS — I10 Essential (primary) hypertension: Secondary | ICD-10-CM

## 2019-08-24 MED ORDER — LISINOPRIL 40 MG OR TABS
ORAL_TABLET | ORAL | 1 refills | Status: DC
Start: 2019-08-24 — End: 2019-12-09

## 2019-08-24 MED ORDER — POTASSIUM CHLORIDE ER 10 MEQ OR TBCR
20.0000 meq | EXTENDED_RELEASE_TABLET | Freq: Every day | ORAL | 1 refills | Status: DC
Start: 2019-08-24 — End: 2019-12-09

## 2019-09-20 ENCOUNTER — Encounter (HOSPITAL_BASED_OUTPATIENT_CLINIC_OR_DEPARTMENT_OTHER): Payer: No Typology Code available for payment source | Admitting: Unknown Physician Specialty

## 2019-09-20 ENCOUNTER — Telehealth (HOSPITAL_BASED_OUTPATIENT_CLINIC_OR_DEPARTMENT_OTHER): Payer: Self-pay | Admitting: Unknown Physician Specialty

## 2019-09-20 NOTE — Telephone Encounter (Signed)
Patient's sister [name], came to the [employer], stating that she canceled her brother's appointment today 6/14 with Dr. Bobbye Charleston and that she would like to talk to Dr. Bobbye Charleston "in the next 5 minutes". I informed her that,  Dr.  Bobbye Charleston  was conducting an appointment with a patient at the time, that may take more than 5 minutes, and offered to sent a message which she declined.   I offered a phone call from Dr. Bobbye Charleston ASAP, which she declined as well.  I offered the clinic's manager assistance, Susana, but she also declined this offer stating that no one should ever call them again, that she already told this to [place].   I informed Dr. Bobbye Charleston and Creta Levin.

## 2019-10-01 ENCOUNTER — Encounter (HOSPITAL_BASED_OUTPATIENT_CLINIC_OR_DEPARTMENT_OTHER): Payer: Self-pay | Admitting: Unknown Physician Specialty

## 2019-10-01 DIAGNOSIS — R197 Diarrhea, unspecified: Secondary | ICD-10-CM | POA: Insufficient documentation

## 2019-12-09 ENCOUNTER — Other Ambulatory Visit (HOSPITAL_BASED_OUTPATIENT_CLINIC_OR_DEPARTMENT_OTHER): Payer: Self-pay | Admitting: Unknown Physician Specialty

## 2019-12-09 DIAGNOSIS — I10 Essential (primary) hypertension: Secondary | ICD-10-CM

## 2019-12-10 MED ORDER — LISINOPRIL 40 MG OR TABS
40.0000 mg | ORAL_TABLET | Freq: Every day | ORAL | 0 refills | Status: DC
Start: 2019-12-10 — End: 2020-01-18

## 2019-12-10 MED ORDER — POTASSIUM CHLORIDE ER 10 MEQ OR TBCR
20.0000 meq | EXTENDED_RELEASE_TABLET | Freq: Every day | ORAL | 0 refills | Status: DC
Start: 2019-12-10 — End: 2020-01-18

## 2019-12-10 NOTE — Telephone Encounter (Signed)
Patient last seen on 03/08/19 and was to return in 2 months.  One refill authorized.  Please schedule follow up visit.

## 2020-01-18 ENCOUNTER — Other Ambulatory Visit (HOSPITAL_BASED_OUTPATIENT_CLINIC_OR_DEPARTMENT_OTHER): Payer: Self-pay | Admitting: Unknown Physician Specialty

## 2020-01-18 ENCOUNTER — Telehealth (HOSPITAL_BASED_OUTPATIENT_CLINIC_OR_DEPARTMENT_OTHER): Payer: Self-pay

## 2020-01-18 DIAGNOSIS — I10 Essential (primary) hypertension: Secondary | ICD-10-CM

## 2020-01-18 MED ORDER — POTASSIUM CHLORIDE ER 10 MEQ OR TBCR
20.0000 meq | EXTENDED_RELEASE_TABLET | Freq: Every day | ORAL | 0 refills | Status: DC
Start: 2020-01-18 — End: 2020-02-28

## 2020-01-18 MED ORDER — LISINOPRIL 40 MG OR TABS
40.0000 mg | ORAL_TABLET | Freq: Every day | ORAL | 0 refills | Status: DC
Start: 2020-01-18 — End: 2020-02-28

## 2020-01-18 NOTE — Telephone Encounter (Signed)
Last seen 11/202 and will be due for labs at visit 04/10/19 with new PCP  Will refill x1 to make it to appt.

## 2020-01-18 NOTE — Telephone Encounter (Signed)
See earlier 10/12 encounter.

## 2020-01-18 NOTE — Telephone Encounter (Signed)
We are contacting you because the provider, Dr. Bobbye Charleston is no longer at the clinic and patient has not established care with a new PCP. This falls outside of the Refill Authorization Center's protocols. Please have you or your staff inform the patient and schedule an appointment if necessary.

## 2020-01-18 NOTE — Telephone Encounter (Signed)
RN received call from [facility] case manager [name]) saying they received call from patient's pharmacy that there are no refills left for his meds.    RN advised message will be forwarded to new PCP Dr. Eulogio Bear. Patient has appnt coming up with new PCP on 02/08/20, need refills for his meds till then.    Note forwarded to Dr. Eulogio Bear.

## 2020-01-20 ENCOUNTER — Other Ambulatory Visit (HOSPITAL_COMMUNITY): Payer: Self-pay | Admitting: Unknown Physician Specialty

## 2020-01-20 DIAGNOSIS — D649 Anemia, unspecified: Secondary | ICD-10-CM

## 2020-01-20 DIAGNOSIS — E119 Type 2 diabetes mellitus without complications: Secondary | ICD-10-CM

## 2020-01-20 DIAGNOSIS — E876 Hypokalemia: Secondary | ICD-10-CM

## 2020-01-20 DIAGNOSIS — I10 Essential (primary) hypertension: Secondary | ICD-10-CM

## 2020-01-20 DIAGNOSIS — E538 Deficiency of other specified B group vitamins: Secondary | ICD-10-CM

## 2020-02-08 ENCOUNTER — Encounter (HOSPITAL_BASED_OUTPATIENT_CLINIC_OR_DEPARTMENT_OTHER): Payer: No Typology Code available for payment source | Admitting: Unknown Physician Specialty

## 2020-02-25 ENCOUNTER — Other Ambulatory Visit (HOSPITAL_BASED_OUTPATIENT_CLINIC_OR_DEPARTMENT_OTHER): Payer: Self-pay | Admitting: Internal Medicine

## 2020-02-25 DIAGNOSIS — I10 Essential (primary) hypertension: Secondary | ICD-10-CM

## 2020-02-28 MED ORDER — POTASSIUM CHLORIDE ER 10 MEQ OR TBCR
EXTENDED_RELEASE_TABLET | ORAL | 4 refills | Status: DC
Start: 2020-02-28 — End: 2020-03-09

## 2020-02-28 MED ORDER — LISINOPRIL 40 MG OR TABS
ORAL_TABLET | ORAL | 4 refills | Status: DC
Start: 2020-02-28 — End: 2020-03-09

## 2020-03-07 ENCOUNTER — Other Ambulatory Visit (HOSPITAL_BASED_OUTPATIENT_CLINIC_OR_DEPARTMENT_OTHER): Payer: Self-pay | Admitting: Internal Medicine

## 2020-03-07 DIAGNOSIS — J309 Allergic rhinitis, unspecified: Secondary | ICD-10-CM

## 2020-03-07 DIAGNOSIS — J45909 Unspecified asthma, uncomplicated: Secondary | ICD-10-CM

## 2020-03-07 DIAGNOSIS — K219 Gastro-esophageal reflux disease without esophagitis: Secondary | ICD-10-CM

## 2020-03-07 DIAGNOSIS — E538 Deficiency of other specified B group vitamins: Secondary | ICD-10-CM

## 2020-03-07 DIAGNOSIS — E119 Type 2 diabetes mellitus without complications: Secondary | ICD-10-CM

## 2020-03-07 DIAGNOSIS — I10 Essential (primary) hypertension: Secondary | ICD-10-CM

## 2020-03-09 MED ORDER — METFORMIN HCL 1000 MG OR TABS
1000.0000 mg | ORAL_TABLET | Freq: Two times a day (BID) | ORAL | 1 refills | Status: DC
Start: 2020-03-09 — End: 2020-06-21

## 2020-03-09 MED ORDER — CYANOCOBALAMIN 1000 MCG OR TABS
1000.0000 ug | ORAL_TABLET | Freq: Every day | ORAL | 1 refills | Status: DC
Start: 2020-03-09 — End: 2020-06-21

## 2020-03-09 MED ORDER — HYDROCHLOROTHIAZIDE 25 MG OR TABS
ORAL_TABLET | ORAL | 1 refills | Status: DC
Start: 2020-03-09 — End: 2020-06-21

## 2020-03-09 MED ORDER — FAMOTIDINE 20 MG OR TABS
20.0000 mg | ORAL_TABLET | Freq: Two times a day (BID) | ORAL | 1 refills | Status: DC
Start: 2020-03-09 — End: 2020-06-21

## 2020-03-09 MED ORDER — ALBUTEROL SULFATE HFA 108 (90 BASE) MCG/ACT IN AERS
INHALATION_SPRAY | RESPIRATORY_TRACT | 1 refills | Status: DC
Start: 2020-03-09 — End: 2020-07-21

## 2020-03-09 MED ORDER — POTASSIUM CHLORIDE ER 10 MEQ OR TBCR
EXTENDED_RELEASE_TABLET | ORAL | 1 refills | Status: DC
Start: 2020-03-09 — End: 2020-06-21

## 2020-03-09 MED ORDER — LORATADINE 10 MG OR TABS
ORAL_TABLET | ORAL | 1 refills | Status: DC
Start: 2020-03-09 — End: 2020-06-21

## 2020-03-09 MED ORDER — PRAVASTATIN SODIUM 40 MG OR TABS
40.0000 mg | ORAL_TABLET | Freq: Every evening | ORAL | 1 refills | Status: DC
Start: 2020-03-09 — End: 2020-06-21

## 2020-03-09 MED ORDER — LISINOPRIL 40 MG OR TABS
ORAL_TABLET | ORAL | 1 refills | Status: DC
Start: 2020-03-09 — End: 2020-06-21

## 2020-04-24 NOTE — Progress Notes (Deleted)
[facility] AFTER[facility]ARE [facility]IN[facility]  OUTPATIENT NOTE    DATE: 04/25/2020    ID/CHIEF COMPLAINT:  Mr. Lucarelli is a 55 year old [place] speaking male with developmental delay, schizophrenia, T2DM (on metformin monotherapy), chronic diarrhea (with some e/o colitis on CT scan and elevated CRP), weight loss, HTN, .  who presents today for acute care visit.     INTERVAL HISTORY/HPI:  Last seen by Dr. Bobbye Charleston in November 2020.     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 28 tablet 1    Divalproex Sodium 250 MG Oral Tab EC Take 750 mg by mouth at bedtime.      famotidine 20 MG tablet Take 1 tablet (20 mg) by mouth 2 times a day. 56 tablet 1    hydroCHLOROthiazide 25 MG tablet TAKE 1 TABLET BY MOUTH DAILY 28 tablet 1    ketoconazole 2 % shampoo Apply topically 2 times a week. Apply to scalp when shampooing. 1 bottle 3    lisinopril 40 MG tablet TAKE 1 TABLET BY MOUTH DAILY 28 tablet 1    loratadine 10 MG tablet TAKE 1 TABLET BY MOUTH DAILY 28 tablet 1    metFORMIN 1000 MG tablet Take 1 tablet (1,000 mg) by mouth 2 times a day. 56 tablet 1    Paliperidone Palmitate 234 MG/1.5ML Intramuscular Suspension Inject 234 mg intramuscularly every 4 weeks.      potassium chloride ER 10 MEQ ER tablet TAKE 2 TABLETS BY MOUTH DAILY 56 tablet 1    pravastatin 40 MG tablet Take 1 tablet (40 mg) by  mouth every evening. 28 tablet 1     No current facility-administered medications for this visit.       ALLERGIES:  Review of patient's allergies indicates:  No Known Allergies    FAMILY HISTORY:  Family History     Problem (# of Occurrences) Relation (Name,Age of Onset)    Cancer (1) Mother    Diabetes (1) Mother    Mental Illness (1) Sister    Stroke (1) Mother: 2012 stroke          SOCIAL HISTORY:  Habits:   - Alcohol:  - Tobacco:  - Drug Use:  Occupational history:  Housing:   Family/Support History:  Care providers/Case Manager:     PHYSICAL EXAM:  There were no vitals taken for this visit.    GEN: NAD ***  HEENT: PERRLA, EOMI, no conjunctival icterus, pallor or injection, no cervical or supraclavicular LAD, good dentition with no oral lesions or pharyngeal erythema ***  SKIN: No rashes, no nail changes ***  CV: RRR, Normal S1 & S2, with no R/M/G. No JVD noted. No clubbing, cyanosis, or edema.  RESP: Easy work of breathing. CTAB,  without crackles, wheezes, or rhonchi ***  GI: Soft, NT/ND, BS+, no HSM  MSK: Normal muscle bulk and tone, no tenderness ***  PSYCH:   Appropriate and euthymic   Neuro: Alert and fully oriented, CN II-XII grossly intact, normal gait ***    RELEVANT STUDIES      ASSESSMENT AND PLAN:  Type 2 diabetes mellitus without complication ([place]): due for labs today.  Well controlled on metformin alone.  Has prior outside optho ([facility]) but is overdue for repeat exam and unsure if they perform dilated eye exams or just glasses eval.  Attempted to take screening retinopathy photos in clinic, but patient was unable to tolerate.   -     Hemoglobin A1c  -     URINE SCREEN, MICROALBUMINURIA  -     Cancel: DIABETIC RETINOPATHY SCREENING  -     Referral to optho   -     FOOT EXAM W/MONOFILAMENT nml today   -     Cont metformin, pravastatin     Bowel Habit changes, hx of chronic diarrhea: chronic diarrhea and hx of ? fecal incontinence prompted work up w/ GI notable for elevated CRP, CT scan with  inflammatory changes in bowel. Multiple attempts for colonoscopy in past but never able to obtain. Concern for underlying IBD. Repeat CT scan was ordered to assess if inflammatory changes seen previously still present -- scan with ongoing thickening of the prox jejunum and distal ileum. In light of persistent changes >1 year think that a colonoscopy is warranted.Also has had weight loss of unclear etiology--unsure if related, but important to rule out IBD.  Notably, although patient does not endorse significant symptoms right now, difficult to assess as he is a difficult historian. Regardless of symptoms, think that given prior imaging and lab findings + age, a colonoscopy is reasonable.     Has had a lot of difficulty with coordinating colonoscopy with GI given patient's developmental delay and complexity of colonoscopy prep. Discussed with [name] and [name] today, who will assist with coordination. - Will discuss with GI about re-eval in clinic to try to help coordinate colonoscopy (and EGD as below). Let Jeneen Rinks and [name] know that I think first step in getting colonoscopy will be a repeat clinic eval   - Will msg GI who is planning to help coordinate care contact info for Yasin' sister, [phone]; GI to arrange colonoscopy per their last note     Weight loss: unclear etiology.  Case manager thinks he is actually gaining weight back after significant weight loss this summer.  In addition to eval with colonoscopy, will recheck CBC, CMP, TSH.  No other localizing symptoms at this time.  Reassuring that weight is reportedly stabilizing/improving.   -     Hepatic Function Panel, CBC, BMP, A1c  -     TSH with Reflexive Free T4    HTN: above goal today, but did not take meds.  No home values, but per CM, can start helping with checking when he gets scheduled long acting injectable antipsychotic.    - Refill HCTZ, lisinopril, Kcl  - BMP     HxB12 deficiency: ddx includes metformin, notably EGD is  pending with GI given hx of B12 deficiency to further eval.Anemia and leukopenia improved with B12 supplementation  - CBC, B12 check   - Restart cyanocobalamin (has not been taking)     Leukopenia, unspecified type: previously has had low normal WBC for years,worsened last year and then improved following  B12 supplementation, indicating acute change was likely due to B12 def. Smear showed no WBC abnormalities. Previously HIV, hep C negative. Ddx includes 2/2 med side effects (specifically valproate), may also be benign variant given chronically low normal values.  - Repeat CBC today     HCM:   - Flu shot today   - Due for shingrix as well; will prioritize flu shot for now     Dandruff  -     ketoconazole 2 % shampoo; Apply topically 2 times a week. Apply to scalp when shampooing.      Not discussed:   #Schizophrenia, developmental delay:Continue follow up at [facility] and his case [place].   -Continue psych medications per mental health provider (palperidone IM q28 days and divalproex 750 daily)     #Asthma: albuterol PRN refilled     #Gerd: Famotidine refilled     Follow-up: 2 months       # Primary Care and Follow up:

## 2020-04-25 ENCOUNTER — Encounter (HOSPITAL_BASED_OUTPATIENT_CLINIC_OR_DEPARTMENT_OTHER): Payer: No Typology Code available for payment source | Admitting: Internal Medicine

## 2020-06-20 ENCOUNTER — Other Ambulatory Visit: Payer: Self-pay | Admitting: Gastroenterology

## 2020-06-20 DIAGNOSIS — Z1211 Encounter for screening for malignant neoplasm of colon: Secondary | ICD-10-CM

## 2020-06-21 ENCOUNTER — Telehealth (HOSPITAL_BASED_OUTPATIENT_CLINIC_OR_DEPARTMENT_OTHER): Payer: Self-pay | Admitting: Internal Medicine

## 2020-06-21 DIAGNOSIS — E538 Deficiency of other specified B group vitamins: Secondary | ICD-10-CM

## 2020-06-21 DIAGNOSIS — I10 Essential (primary) hypertension: Secondary | ICD-10-CM

## 2020-06-21 DIAGNOSIS — E119 Type 2 diabetes mellitus without complications: Secondary | ICD-10-CM

## 2020-06-21 DIAGNOSIS — J309 Allergic rhinitis, unspecified: Secondary | ICD-10-CM

## 2020-06-21 DIAGNOSIS — K219 Gastro-esophageal reflux disease without esophagitis: Secondary | ICD-10-CM

## 2020-06-21 MED ORDER — METFORMIN HCL 1000 MG OR TABS
1000.0000 mg | ORAL_TABLET | Freq: Two times a day (BID) | ORAL | 0 refills | Status: DC
Start: 2020-06-21 — End: 2020-07-21

## 2020-06-21 MED ORDER — HYDROCHLOROTHIAZIDE 25 MG OR TABS
ORAL_TABLET | ORAL | 0 refills | Status: DC
Start: 2020-06-21 — End: 2020-07-21

## 2020-06-21 MED ORDER — PRAVASTATIN SODIUM 40 MG OR TABS
40.0000 mg | ORAL_TABLET | Freq: Every evening | ORAL | 0 refills | Status: DC
Start: 2020-06-21 — End: 2020-07-21

## 2020-06-21 MED ORDER — CYANOCOBALAMIN 1000 MCG OR TABS
1000.0000 ug | ORAL_TABLET | Freq: Every day | ORAL | 0 refills | Status: DC
Start: 2020-06-21 — End: 2020-07-21

## 2020-06-21 MED ORDER — LISINOPRIL 40 MG OR TABS
ORAL_TABLET | ORAL | 0 refills | Status: DC
Start: 2020-06-21 — End: 2020-07-21

## 2020-06-21 MED ORDER — POTASSIUM CHLORIDE ER 10 MEQ OR TBCR
20.0000 meq | EXTENDED_RELEASE_TABLET | Freq: Every day | ORAL | 0 refills | Status: DC
Start: 2020-06-21 — End: 2020-08-03

## 2020-06-21 MED ORDER — LORATADINE 10 MG OR TABS
ORAL_TABLET | ORAL | 0 refills | Status: DC
Start: 2020-06-21 — End: 2020-07-21

## 2020-06-21 MED ORDER — FAMOTIDINE 20 MG OR TABS
20.0000 mg | ORAL_TABLET | Freq: Two times a day (BID) | ORAL | 0 refills | Status: DC
Start: 2020-06-21 — End: 2020-07-21

## 2020-06-21 NOTE — Telephone Encounter (Signed)
RETURN CALL: No Call Back Needed      SUBJECT:  Refill Request    NAME OF MEDICATION(S): Cyanocobalamin, Famotidine, Hydrochlorothiazide, Lisinopril, Loratadine, Metformin, Potassium Chloride, Pravastatin  DATE NEEDED BY: Today  PRESCRIBING PROVIDER: Dr Gerald Leitz NAME/LOCATION: [facility]  ADDITIONAL INFORMATION: Please callback if any issues with request, thanks.

## 2020-07-12 ENCOUNTER — Encounter (HOSPITAL_BASED_OUTPATIENT_CLINIC_OR_DEPARTMENT_OTHER): Payer: Self-pay | Admitting: Internal Medicine

## 2020-07-21 ENCOUNTER — Ambulatory Visit: Payer: No Typology Code available for payment source | Attending: Nurse Practitioner | Admitting: Nurse Practitioner

## 2020-07-21 VITALS — BP 155/70 | HR 102 | Temp 97.5°F | Resp 20 | Wt 194.0 lb

## 2020-07-21 DIAGNOSIS — L218 Other seborrheic dermatitis: Secondary | ICD-10-CM

## 2020-07-21 DIAGNOSIS — L210 Seborrhea capitis: Secondary | ICD-10-CM | POA: Insufficient documentation

## 2020-07-21 DIAGNOSIS — Z0000 Encounter for general adult medical examination without abnormal findings: Secondary | ICD-10-CM | POA: Insufficient documentation

## 2020-07-21 DIAGNOSIS — E119 Type 2 diabetes mellitus without complications: Secondary | ICD-10-CM | POA: Insufficient documentation

## 2020-07-21 DIAGNOSIS — J45909 Unspecified asthma, uncomplicated: Secondary | ICD-10-CM | POA: Insufficient documentation

## 2020-07-21 DIAGNOSIS — J309 Allergic rhinitis, unspecified: Secondary | ICD-10-CM | POA: Insufficient documentation

## 2020-07-21 DIAGNOSIS — I10 Essential (primary) hypertension: Secondary | ICD-10-CM | POA: Insufficient documentation

## 2020-07-21 DIAGNOSIS — E538 Deficiency of other specified B group vitamins: Secondary | ICD-10-CM | POA: Insufficient documentation

## 2020-07-21 DIAGNOSIS — Z7984 Long term (current) use of oral hypoglycemic drugs: Secondary | ICD-10-CM

## 2020-07-21 DIAGNOSIS — K219 Gastro-esophageal reflux disease without esophagitis: Secondary | ICD-10-CM | POA: Insufficient documentation

## 2020-07-21 DIAGNOSIS — F259 Schizoaffective disorder, unspecified: Secondary | ICD-10-CM | POA: Insufficient documentation

## 2020-07-21 MED ORDER — LISINOPRIL 40 MG OR TABS
ORAL_TABLET | ORAL | 6 refills | Status: DC
Start: 2020-07-21 — End: 2020-08-03

## 2020-07-21 MED ORDER — SELENIUM SULFIDE 2.5 % EX LOTN
TOPICAL_LOTION | CUTANEOUS | 6 refills | Status: DC
Start: 2020-07-24 — End: 2020-08-03

## 2020-07-21 MED ORDER — ALBUTEROL SULFATE HFA 108 (90 BASE) MCG/ACT IN AERS
INHALATION_SPRAY | RESPIRATORY_TRACT | 6 refills | Status: DC
Start: 2020-07-21 — End: 2020-08-03

## 2020-07-21 MED ORDER — SHINGRIX 50 MCG/0.5ML IM SUSR
INTRAMUSCULAR | 1 refills | Status: DC
Start: 2020-07-21 — End: 2020-07-21

## 2020-07-21 MED ORDER — HYDROCHLOROTHIAZIDE 25 MG OR TABS
ORAL_TABLET | ORAL | 6 refills | Status: DC
Start: 2020-07-21 — End: 2020-08-03

## 2020-07-21 MED ORDER — CYANOCOBALAMIN 1000 MCG OR TABS
1000.0000 ug | ORAL_TABLET | Freq: Every day | ORAL | 6 refills | Status: DC
Start: 2020-07-21 — End: 2020-08-03

## 2020-07-21 MED ORDER — SHINGRIX 50 MCG/0.5ML IM SUSR
INTRAMUSCULAR | 1 refills | Status: AC
Start: 2020-07-21 — End: 2021-07-21

## 2020-07-21 MED ORDER — PRAVASTATIN SODIUM 40 MG OR TABS
40.0000 mg | ORAL_TABLET | Freq: Every evening | ORAL | 6 refills | Status: DC
Start: 2020-07-21 — End: 2020-08-03

## 2020-07-21 MED ORDER — METFORMIN HCL 1000 MG OR TABS
1000.0000 mg | ORAL_TABLET | Freq: Two times a day (BID) | ORAL | 6 refills | Status: DC
Start: 2020-07-21 — End: 2020-08-03

## 2020-07-21 MED ORDER — FAMOTIDINE 20 MG OR TABS
20.0000 mg | ORAL_TABLET | Freq: Two times a day (BID) | ORAL | 6 refills | Status: DC
Start: 2020-07-21 — End: 2020-08-03

## 2020-07-21 MED ORDER — LORATADINE 10 MG OR TABS
ORAL_TABLET | ORAL | 6 refills | Status: DC
Start: 2020-07-21 — End: 2020-08-03

## 2020-07-21 MED ORDER — DIVALPROEX SODIUM 250 MG OR TBEC
750.0000 mg | DELAYED_RELEASE_TABLET | Freq: Every evening | ORAL | 6 refills | Status: DC
Start: 2020-07-21 — End: 2020-08-03

## 2020-07-21 NOTE — Progress Notes (Signed)
Adult Medicine Clinic - Outpatient Return Clinic Note    DATE: 07/21/2020     Justin Zavala is a 55 year old male who is here today for routine PC followup and med review, including refills and labs.An interpreter was not needed for the visit.    PROBLEM LIST:  Patient Active Problem List    Diagnosis Date Noted    Extrinsic asthma 01/04/2011    Chronic Diarrhea, concern for IBD 10/01/2019     chronic diarrhea and hx of ? fecal incontinence prompted work up w/ GI notable for elevated CRP, CT scan with inflammatory changes in bowel. Multiple attempts for colonoscopy in past but never able to obtain. Concern for underlying IBD. Repeat CT scan was ordered to assess if inflammatory changes seen previously still present -- scan with ongoing thickening of the prox jejunum and distal ileum. In light of persistent changes >1 year think that a colonoscopy is warranted.Also has had weight loss of unclear etiology--unsure if related, but important to rule out IBD.  Very difficult to assess symptoms based on history alone.  Working with GI towards obtaining colonoscopy.       Leukopenia 09/18/2016     Low normal WBC times years; worseed and was diagnosed with B12 deficiency--returned to low normal following supplementation. Smear showed no WBC abnormalities. Previously HIV, hep C negative. Ddx includes 2/2 med side effects (specifically valproate), may also be benign variant given chronically low normal values.        B12 deficiency 09/18/2016    Anemia 09/18/2016     Due to B12 deficiency; improved with supplementation       Schizophrenia ([place]) 09/13/2013     Per chart review  Se[facility] at [facility].      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 Should be called after appointments to update him on any changes  Mother  [name] can be reached at [phone]   [name] can be reached at work number [phone]  Has a caregiver a few times a week for cleaning, cooking, bathing assistance.      Type 2 diabetes mellitus without complication, without long-term current use of insulin 04/20/2010     MEDICATIONS:  Current Outpatient Medications   Medication Sig Dispense Refill    albuterol HFA 108 (90 Base) MCG/ACT inhaler INHALE 2 PUFFS BY MOUTH EVERY SIX HOURS AS NEEDED FOR SHORTNESS OF BREATH 8.5 g 6    cyanocobalamin 1000 MCG tablet Take 1 tablet (1,000 mcg) by mouth daily. 28 tablet 6    divalproex 250 MG DR tablet Take 3 tablets (750 mg) by mouth at bedtime. 90 tablet 6    famotidine 20 MG tablet Take 1 tablet (20 mg) by mouth 2 times a day. 56 tablet 6    hydroCHLOROthiazide 25 MG tablet TAKE 1 TABLET BY MOUTH DAILY 28 tablet 6    lisinopril 40 MG tablet TAKE 1 TABLET BY MOUTH DAILY 28 tablet 6    loratadine 10 MG tablet TAKE 1 TABLET BY MOUTH DAILY 28 tablet 6    metFORMIN 1000 MG tablet Take 1 tablet (1,000 mg) by mouth 2 times a day. 56 tablet 6    Paliperidone Palmitate 234 MG/1.5ML Intramuscular Suspension Inject 234 mg intramuscularly every 4 weeks.      potassium chloride ER 10 MEQ ER tablet  Take 2 tablets (20 mEq) by mouth daily. 56 tablet 0    pravastatin 40 MG tablet Take 1 tablet (40 mg) by mouth every evening. 28 tablet 6    [START ON 07/24/2020] selenium sulfide 2.5 % lotion Apply topically 2 times a week. Apply to scalp and beard area 118 mL 6    zoster recombinant vaccine (Shingrix) 50 MCG/0.5ML injection Inject 0.5 mL (50 mcg) intramuscularly once. Administer a second dose 2 to 6 months after the first dose. 1 each 1     No current facility-administered medications for this visit.     ALLERGIES:  Review of patient's allergies indicates:  No Known Allergies  CHIEF COMPLAINT: No chief complaint on file.    INTERVAL HISTORY/HPI:  Accompanied by supportive sister who visits pt. Everyday at his appt. on [address] in a safe secured building per sister.  Has CG hrs' weekly.  No new concerns.  Wt. Has increased by 25# since last recording11/30/20.    PHYSICAL EXAM:  BP (!) 155/70    Pulse (!) 102    Temp 36.4 C (Temporal)    Resp 20    Wt 88 kg (194 lb)    SpO2 98% Comment: %RA   BMI 36.66 kg/m   Pleasant WNWD well groomed AA male in NAD  Psych: affect flat, quiet, cooperative during visit    LAB RESULTS:  See below and pended labs from 10/'21    ASSESSMENT AND PLAN:  Diagnoses and all orders for this visit:    Schizoaffective disorder, unspecified type ([place])  -     divalproex 250 MG DR tablet; Take 3 tablets (750 mg) by mouth at bedtime.    Type 2 diabetes mellitus without complication, without long-term current use of insulin  -     pravastatin 40 MG tablet; Take 1 tablet (40 mg) by mouth every evening.  -     metFORMIN 1000 MG tablet; Take 1 tablet (1,000 mg) by mouth 2 times a day.  -     Lipid Panel; Future  -     Hepatic Function Panel; Future  -     TSH w/Reflexive Free T4; Future    Essential hypertension  -     lisinopril 40 MG tablet; TAKE 1 TABLET BY MOUTH DAILY  -     hydroCHLOROthiazide 25 MG tablet; TAKE 1 TABLET BY MOUTH DAILY    Allergic rhinitis, unspecified seasonality, unspecified trigger  -     loratadine 10 MG tablet; TAKE 1 TABLET BY MOUTH DAILY    Gastroesophageal reflux disease, unspecified whether esophagitis present  -     famotidine 20 MG tablet; Take 1 tablet (20 mg) by mouth 2 times a day.    B12 deficiency  -     cyanocobalamin 1000 MCG tablet; Take 1 tablet (1,000 mcg) by mouth daily.    Extrinsic asthma, unspecified asthma severity, uncomplicated  -     albuterol HFA 108 (90 Base) MCG/ACT inhaler; INHALE 2 PUFFS BY MOUTH EVERY SIX HOURS AS NEEDED FOR SHORTNESS OF BREATH    Dandruff in adult  -     selenium sulfide 2.5 % lotion; Apply topically 2 times a week. Apply to scalp and beard area    Healthcare maintenance  -     zoster recombinant vaccine (Shingrix) 50 MCG/0.5ML injection; Inject  0.5 mL (50 mcg) intramuscularly once. Administer a second dose 2 to 6 months after the first dose.    Prefers to have eye screening  in clinic, Tdap and Hep B vaccinations next visit with new PCP on 08/03/20.

## 2020-08-03 ENCOUNTER — Ambulatory Visit: Payer: No Typology Code available for payment source | Attending: Internal Medicine | Admitting: Internal Medicine

## 2020-08-03 ENCOUNTER — Other Ambulatory Visit (HOSPITAL_BASED_OUTPATIENT_CLINIC_OR_DEPARTMENT_OTHER): Payer: Self-pay

## 2020-08-03 VITALS — BP 133/53 | HR 98 | Temp 97.0°F | Resp 16 | Ht 61.0 in | Wt 196.0 lb

## 2020-08-03 DIAGNOSIS — E669 Obesity, unspecified: Secondary | ICD-10-CM | POA: Insufficient documentation

## 2020-08-03 DIAGNOSIS — K219 Gastro-esophageal reflux disease without esophagitis: Secondary | ICD-10-CM | POA: Insufficient documentation

## 2020-08-03 DIAGNOSIS — Z6830 Body mass index (BMI) 30.0-30.9, adult: Secondary | ICD-10-CM

## 2020-08-03 DIAGNOSIS — E538 Deficiency of other specified B group vitamins: Secondary | ICD-10-CM | POA: Insufficient documentation

## 2020-08-03 DIAGNOSIS — Z794 Long term (current) use of insulin: Secondary | ICD-10-CM

## 2020-08-03 DIAGNOSIS — J309 Allergic rhinitis, unspecified: Secondary | ICD-10-CM | POA: Insufficient documentation

## 2020-08-03 DIAGNOSIS — I10 Essential (primary) hypertension: Secondary | ICD-10-CM | POA: Insufficient documentation

## 2020-08-03 DIAGNOSIS — F259 Schizoaffective disorder, unspecified: Secondary | ICD-10-CM | POA: Insufficient documentation

## 2020-08-03 DIAGNOSIS — L210 Seborrhea capitis: Secondary | ICD-10-CM | POA: Insufficient documentation

## 2020-08-03 DIAGNOSIS — Z0000 Encounter for general adult medical examination without abnormal findings: Secondary | ICD-10-CM | POA: Insufficient documentation

## 2020-08-03 DIAGNOSIS — J45909 Unspecified asthma, uncomplicated: Secondary | ICD-10-CM | POA: Insufficient documentation

## 2020-08-03 DIAGNOSIS — E119 Type 2 diabetes mellitus without complications: Secondary | ICD-10-CM | POA: Insufficient documentation

## 2020-08-03 DIAGNOSIS — L305 Pityriasis alba: Secondary | ICD-10-CM

## 2020-08-03 MED ORDER — LISINOPRIL 40 MG OR TABS
40.0000 mg | ORAL_TABLET | Freq: Every day | ORAL | 6 refills | Status: DC
Start: 2020-08-03 — End: 2021-03-19
  Filled 2020-08-03: qty 28, 28d supply, fill #0

## 2020-08-03 MED ORDER — AEROCHAMBER Z-STAT PLUS MISC
0 refills | Status: DC
Start: 2020-08-03 — End: 2021-08-20
  Filled 2020-08-03: qty 1, 1d supply, fill #0

## 2020-08-03 MED ORDER — METFORMIN HCL 1000 MG OR TABS
1000.0000 mg | ORAL_TABLET | Freq: Two times a day (BID) | ORAL | 6 refills | Status: DC
Start: 2020-08-03 — End: 2021-03-19
  Filled 2020-08-03: qty 56, 28d supply, fill #0

## 2020-08-03 MED ORDER — FAMOTIDINE 20 MG OR TABS
20.0000 mg | ORAL_TABLET | Freq: Two times a day (BID) | ORAL | 6 refills | Status: DC
Start: 2020-08-03 — End: 2021-03-21
  Filled 2020-08-03: qty 56, 28d supply, fill #0

## 2020-08-03 MED ORDER — ALBUTEROL SULFATE HFA 108 (90 BASE) MCG/ACT IN AERS
2.0000 | INHALATION_SPRAY | Freq: Four times a day (QID) | RESPIRATORY_TRACT | 6 refills | Status: DC | PRN
Start: 2020-08-03 — End: 2021-04-19
  Filled 2020-08-03: qty 8.5, 25d supply, fill #0

## 2020-08-03 MED ORDER — SELENIUM SULFIDE 2.5 % EX LOTN
TOPICAL_LOTION | CUTANEOUS | 6 refills | Status: DC
Start: 2020-08-03 — End: 2021-08-20
  Filled 2020-08-03: qty 120, 30d supply, fill #0

## 2020-08-03 MED ORDER — POTASSIUM CHLORIDE ER 10 MEQ OR TBCR
20.0000 meq | EXTENDED_RELEASE_TABLET | Freq: Every day | ORAL | 0 refills | Status: DC
Start: 2020-08-03 — End: 2020-12-06
  Filled 2020-08-03: qty 56, 28d supply, fill #0

## 2020-08-03 MED ORDER — CYANOCOBALAMIN 1000 MCG OR TABS
1000.0000 ug | ORAL_TABLET | Freq: Every day | ORAL | 6 refills | Status: DC
Start: 2020-08-03 — End: 2021-04-19
  Filled 2020-08-03: qty 28, 28d supply, fill #0

## 2020-08-03 MED ORDER — HYDROCHLOROTHIAZIDE 25 MG OR TABS
25.0000 mg | ORAL_TABLET | Freq: Every day | ORAL | 6 refills | Status: DC
Start: 2020-08-03 — End: 2021-03-19
  Filled 2020-08-03: qty 28, 28d supply, fill #0

## 2020-08-03 MED ORDER — PRAVASTATIN SODIUM 40 MG OR TABS
40.0000 mg | ORAL_TABLET | Freq: Every evening | ORAL | 6 refills | Status: DC
Start: 2020-08-03 — End: 2021-03-19
  Filled 2020-08-03: qty 28, 28d supply, fill #0

## 2020-08-03 MED ORDER — LORATADINE 10 MG OR TABS
10.0000 mg | ORAL_TABLET | Freq: Every day | ORAL | 6 refills | Status: DC
Start: 2020-08-03 — End: 2021-03-19
  Filled 2020-08-03: qty 28, 28d supply, fill #0

## 2020-08-03 MED ORDER — DIVALPROEX SODIUM 250 MG OR TBEC
750.0000 mg | DELAYED_RELEASE_TABLET | Freq: Every evening | ORAL | 6 refills | Status: DC
Start: 2020-08-03 — End: 2021-04-19
  Filled 2020-08-03: qty 90, 30d supply, fill #0

## 2020-08-03 NOTE — Progress Notes (Signed)
Fecal Occult Blood Test    Ordered by: Ronald Lobo, MD  Primary Learner: Patient  Challenges: None  Motivation of Learning: Engaging with Education  Method of Teaching: Discussion with Patient  Topic Taught: Fecal Occult Blood test x 1 to take home, instructed patient to turn in specimen as soon as possible, Exp. 02/25/21  Post Education Response: Patient states understanding  Total time spent teaching: 5 minutes

## 2020-08-03 NOTE — Progress Notes (Signed)
Adult Medicine Clinic - Outpatient Initial Clinic Note    DATE: 08/03/2020     Justin Zavala is a 55 year old male who is here today for establishment of new primary care provider.  He is here with his sister [name], who works at [employer] in the radiology department. An interpreter was not needed for the visit.    PROBLEM LIST:  Patient Active Problem List    Diagnosis Date Noted    Extrinsic asthma 01/04/2011    Chronic Diarrhea, concern for IBD 10/01/2019     chronic diarrhea and hx of ? fecal incontinence prompted work up w/ GI notable for elevated CRP, CT scan with inflammatory changes in bowel. Multiple attempts for colonoscopy in past but never able to obtain. Concern for underlying IBD. Repeat CT scan was ordered to assess if inflammatory changes seen previously still present -- scan with ongoing thickening of the prox jejunum and distal ileum. In light of persistent changes >1 year think that a colonoscopy is warranted.Also has had weight loss of unclear etiology--unsure if related, but important to rule out IBD.  Very difficult to assess symptoms based on history alone.  Working with GI towards obtaining colonoscopy.       Leukopenia 09/18/2016     Low normal WBC times years; worseed and was diagnosed with B12 deficiency--returned to low normal following supplementation. Smear showed no WBC abnormalities. Previously HIV, hep C negative. Ddx includes 2/2 med side effects (specifically valproate), may also be benign variant given chronically low normal values.        B12 deficiency 09/18/2016    Anemia 09/18/2016     Due to B12 deficiency; improved with supplementation       Schizophrenia (HCC) 09/13/2013     Per chart review  Seen at [facility].      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  Should be called after appointments to update him on any changes  Mother [name] can be reached at [phone]   [name] can be reached at work number [phone]  Has a caregiver a few times a week for cleaning, cooking, bathing assistance.      Type 2 diabetes mellitus without complication, without long-term current use of insulin 04/20/2010     CHIEF COMPLAINT: Establish Care, Blood Pressure, and Diabetes    INTERVAL HISTORY/HPI:  Patient was previously under primary care of Dr. Vicenta Aly, who last saw patient, his case manager, and his sister on 03/08/19. At that time, she worked with him on addressing overdue DM2 issues including referral to ophthalmology (tried photos in clinic, did not tolerate), did foot examination then, ordered labs and refilled meds. Patient also with apparent history of chronic diarrhea and possible fecal incontinence, and at that time noted elevated CRP and CT with inflammatory changes, with concern for possible IBD. There was some challenges to trying to get colonoscopy given developmental delay and complexity of prep, and there was discussion among his support about trying to get this preparation but it appears was not done.  His HTN was not under control at that visit, and his leukopenia was thought mostly stable. He has h/o B12 deficiency and this was part of the motivation for also getting EGD. His schizophrenia was being followed at the time through [facility] with IM palperidone  q4 weeks and divalproex daily. Asthma and GERD meds were refilled as well.      Patient then was not seen in our clinic until by [name] on 07/21/20. At that time, mostly refilled meds and ordered labs. Received Rx for Shingrix, noted to be due for Tdap and HBV vaccinations soon. Received third dose mRNA COVID dose on 04/26/20. Labs were not done, however.     Today, patient's sister notes that they were planning to get these labs, have not done so yet.  We discussed some of their concerns,  which include working on weight loss strategies- in review (see below) had lost weight but is back up to preweight loss.  Sister [name] notes that she has helped him improve his dietary choices- had largely been consuming carbohydrate heavy choices. She visits him 4-5 days per week to help check in on him, which is a change in his life over the past few years previously.     He has been using his inhaler- uses pretty much every day. She notes heavy breathing, and he is using before going on walks, notes more use at bedtime and night.     Endorses snoring, daytime somnolence.  STOP -BANG score likely 7.     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 28 tablet 6    divalproex 250 MG DR tablet Take 3 tablets (750 mg) by mouth at bedtime. 90 tablet 6    famotidine 20 MG tablet Take 1 tablet (20 mg) by mouth 2 times a day. 56 tablet 6    hydroCHLOROthiazide 25 MG tablet Take 1 tablet (25 mg) by mouth daily. 28 tablet 6    lisinopril 40 MG tablet Take 1 tablet (40 mg) by mouth daily. 28 tablet 6    loratadine 10 MG tablet Take 1 tablet (10 mg) by mouth daily. 28 tablet 6    metFORMIN 1000 MG tablet Take 1 tablet (1,000 mg) by mouth 2 times a day. 56 tablet 6    Paliperidone Palmitate 234 MG/1.5ML Intramuscular Suspension Inject 234 mg intramuscularly every 4 weeks.      potassium chloride ER 10 MEQ ER  tablet Take 2 tablets (20 mEq) by mouth daily. 56 tablet 0    pravastatin 40 MG tablet Take 1 tablet (40 mg) by mouth every evening. 28 tablet 6    selenium sulfide 2.5 % lotion Apply topically 2 times a week. Apply to scalp and beard area 120 mL 6    Spacer/Aero-Holding Chambers (aerochamber plus) Use with inhaler as directed. 1 each 0    zoster recombinant vaccine (Shingrix) 50 MCG/0.5ML injection Inject 0.5 mL (50 mcg) intramuscularly once. Administer a second dose 2 to 6 months after the first dose. 1 each 1     No  current facility-administered medications for this visit.     ALLERGIES:  Review of patient's allergies indicates:  No Known Allergies     RISK FACTORS:  Tobacco history: Shermaine reports that he has never smoked. He has never used smokeless tobacco.  Comments: Never smoked, does not drink alcohol, no past drug use.     FAMILY MEDICAL HISTORY:  Miriam's family history includes Cancer in his mother; Diabetes in his mother; Mental Illness in his sister; Stroke in his mother.  Mother died age 53 from complications of DM, CVA  Father recently passed away age 41, had DM.     SOCIAL HISTORY:  Living: Alone in 1 BR apt  Education: Graduated high school  Marital status: single  Employment history: Not working    [employer] to [employer].  Receiving monthly IM injection at home.  Sister goes to home 5 days per week. Receives SSDI and foodstamps, and [employer].     Case manager [name] through [facility] [employer].       REVIEW OF SYSTEMS:  Asthma- using inhaler nearly every day, before his walks, when feeling tired. When does not using notes more shortness of breath, worse at night.     Vitals 06/17/2017 12/31/2017 02/23/2018   Weight (lbs) 195 lb 1.7 oz 193 lb 198 lb     Vitals 03/08/2019 07/21/2020 08/03/2020   Weight (lbs) 169 lb 194 lb 196 lb       PHYSICAL EXAM:  BP (!) 133/53    Pulse 98    Temp 36.1 C (Temporal)    Resp 16    Ht 5\' 1"  (1.549 m)    Wt 88.9 kg (196 lb)     SpO2 97% Comment: at room air,resting   BMI 37.03 kg/m   GEN: no acute distress.   HEENT: anicteric  NECK: supple  CHEST: good air movement.  Although sister concerned about breathing, on auscultation without clear wheezing or prolongation of I:E ratio.     LAB RESULTS:  Pending    ASSESSMENT AND PLAN:  Patient here to establish primary care.     1. Type 2 diabetes mellitus without complication, without long-term current use of insulin  Ordered additional labs to monitor A1c, UAlb/Cr.  Due for referral to eye care and foot exam at next visit. Refilled medications.   - Albumin, Random Urine; Future  - Basic Metabolic Panel; Future  - Hemoglobin A1C, HPLC; Future  - metFORMIN 1000 MG tablet; Take 1 tablet (1,000 mg) by mouth 2 times a day.  Dispense: 56 tablet; Refill: 6  - pravastatin 40 MG tablet; Take 1 tablet (40 mg) by mouth every evening.  Dispense: 28 tablet; Refill: 6    2. Obesity (BMI 30-39.9)  Referred to nutrition for counseling on healthier diet choices. Elevated risk for OSA, but per sister unlikely to tolerate CPAP. Recommended elevation of head of bed while sleeping.   - Referral to Nutrition; Future    3. Primary hypertension  Continue current meds, check BMP  - Basic Metabolic Panel; Future    4. Essential hypertension  - hydroCHLOROthiazide 25 MG tablet; Take 1 tablet (25 mg) by mouth daily.  Dispense: 28 tablet; Refill: 6  - lisinopril 40 MG tablet; Take 1 tablet (40 mg) by mouth daily.  Dispense: 28 tablet; Refill: 6  - potassium chloride ER 10 MEQ ER tablet; Take 2 tablets (20 mEq) by mouth daily.  Dispense: 56 tablet; Refill: 0    5.  Extrinsic asthma, unspecified asthma severity, uncomplicated  Although using albuterol daily, on examination not clearly wheezing.  Would like to obtain PFTs to assess current state of FEV1, as if he is truly using this frequently we should consider addition of ICS  - albuterol HFA 108 (90 Base) MCG/ACT inhaler; Inhale 2 puffs by mouth every 6 hours as needed for  shortness of breath  Dispense: 8.5 g; Refill: 6  - Spacer/Aero-Holding Chambers (aerochamber plus); Use with inhaler as directed.  Dispense: 1 each; Refill: 0  - Pulmonary Function Testing; Standing  - COVID-19 Coronavirus Qualitative PCR; Future    6. Allergic rhinitis, unspecified seasonality, unspecified trigger  - loratadine 10 MG tablet; Take 1 tablet (10 mg) by mouth daily.  Dispense: 28 tablet; Refill: 6    7. Dandruff in adult  - selenium sulfide 2.5 % lotion; Apply topically 2 times a week. Apply to scalp and beard area  Dispense: 120 mL; Refill: 6    8. B12 deficiency  - cyanocobalamin 1000 MCG tablet; Take 1 tablet (1,000 mcg) by mouth daily.  Dispense: 28 tablet; Refill: 6    9. Schizoaffective disorder, unspecified type ([place])  - divalproex 250 MG DR tablet; Take 3 tablets (750 mg) by mouth at bedtime.  Dispense: 90 tablet; Refill: 6    10. Gastroesophageal reflux disease, unspecified whether esophagitis present  - famotidine 20 MG tablet; Take 1 tablet (20 mg) by mouth 2 times a day.  Dispense: 56 tablet; Refill: 6    11. Health care maintenance  Discussed prior plan to obtain colonoscopy because of concerns about areas of colon wall inflammation.  This has improved on its own with improvement in diet.  Discussed pros/ cons of possible colonoscopy, but given pragmatic considerations will instead start with FOBT.   - Occult Blood by Immunoassay, Stool    Can get second dose booster for COVID in May 2022.     I spent a total of 72 minutes for the patient's care on the date of the service.

## 2020-08-03 NOTE — Patient Instructions (Signed)
Go to the lab sometime this week for some blood work and urine test  We will give you a kit to take home to test the stool for blood- if there is, we will send you for colonoscopy  We [facility] of [facility]   We have sent a referral to get pulmonary function testing done.  They will contact you to schedule this as well as the COVID test.   You can get a COVID booster after May 19.     We will see you back in around 3 months to do a full head to toe examination.

## 2020-08-08 ENCOUNTER — Other Ambulatory Visit (HOSPITAL_BASED_OUTPATIENT_CLINIC_OR_DEPARTMENT_OTHER): Payer: Self-pay

## 2020-08-22 ENCOUNTER — Other Ambulatory Visit (HOSPITAL_BASED_OUTPATIENT_CLINIC_OR_DEPARTMENT_OTHER): Payer: Self-pay

## 2020-10-05 ENCOUNTER — Encounter (HOSPITAL_BASED_OUTPATIENT_CLINIC_OR_DEPARTMENT_OTHER): Payer: No Typology Code available for payment source | Admitting: Internal Medicine

## 2020-11-22 ENCOUNTER — Encounter (HOSPITAL_BASED_OUTPATIENT_CLINIC_OR_DEPARTMENT_OTHER): Payer: No Typology Code available for payment source | Admitting: Internal Medicine

## 2020-12-06 ENCOUNTER — Other Ambulatory Visit (HOSPITAL_BASED_OUTPATIENT_CLINIC_OR_DEPARTMENT_OTHER): Payer: Self-pay | Admitting: Internal Medicine

## 2020-12-06 DIAGNOSIS — I10 Essential (primary) hypertension: Secondary | ICD-10-CM

## 2020-12-07 MED ORDER — POTASSIUM CHLORIDE ER 10 MEQ OR TBCR
20.0000 meq | EXTENDED_RELEASE_TABLET | Freq: Every day | ORAL | 2 refills | Status: DC
Start: 2020-12-07 — End: 2021-03-16

## 2020-12-07 NOTE — Telephone Encounter (Signed)
Needs to make get lab tests done and make follow up appointment with PCP at [facility] in order to receive additional future refills.

## 2020-12-07 NOTE — Telephone Encounter (Signed)
This medication is outside of the [facility]'s protocols. Please sign and close the encounter if you approve:    Potassium chloride- labs still pending. Last K+ 02/2019     If this medication is denied please have your staff inform the patient and schedule an appointment if necessary.

## 2021-03-16 ENCOUNTER — Other Ambulatory Visit (HOSPITAL_BASED_OUTPATIENT_CLINIC_OR_DEPARTMENT_OTHER): Payer: Self-pay | Admitting: Nurse Practitioner

## 2021-03-16 DIAGNOSIS — E119 Type 2 diabetes mellitus without complications: Secondary | ICD-10-CM

## 2021-03-16 DIAGNOSIS — J309 Allergic rhinitis, unspecified: Secondary | ICD-10-CM

## 2021-03-16 DIAGNOSIS — I10 Essential (primary) hypertension: Secondary | ICD-10-CM

## 2021-03-19 ENCOUNTER — Other Ambulatory Visit (HOSPITAL_BASED_OUTPATIENT_CLINIC_OR_DEPARTMENT_OTHER): Payer: Self-pay | Admitting: Nurse Practitioner

## 2021-03-19 DIAGNOSIS — K219 Gastro-esophageal reflux disease without esophagitis: Secondary | ICD-10-CM

## 2021-03-19 MED ORDER — HYDROCHLOROTHIAZIDE 25 MG OR TABS
25.0000 mg | ORAL_TABLET | Freq: Every day | ORAL | 0 refills | Status: DC
Start: 2021-03-19 — End: 2021-04-19

## 2021-03-19 MED ORDER — METFORMIN HCL 1000 MG OR TABS
1000.0000 mg | ORAL_TABLET | Freq: Two times a day (BID) | ORAL | 0 refills | Status: DC
Start: 2021-03-19 — End: 2021-04-19

## 2021-03-19 MED ORDER — LISINOPRIL 40 MG OR TABS
40.0000 mg | ORAL_TABLET | Freq: Every day | ORAL | 0 refills | Status: DC
Start: 2021-03-19 — End: 2021-04-19

## 2021-03-19 MED ORDER — POTASSIUM CHLORIDE ER 10 MEQ OR TBCR
20.0000 meq | EXTENDED_RELEASE_TABLET | Freq: Every day | ORAL | 0 refills | Status: DC
Start: 2021-03-19 — End: 2021-04-19

## 2021-03-19 MED ORDER — PRAVASTATIN SODIUM 40 MG OR TABS
40.0000 mg | ORAL_TABLET | Freq: Every evening | ORAL | 0 refills | Status: DC
Start: 2021-03-19 — End: 2021-04-19

## 2021-03-19 MED ORDER — LORATADINE 10 MG OR TABS
10.0000 mg | ORAL_TABLET | Freq: Every day | ORAL | 0 refills | Status: DC
Start: 2021-03-19 — End: 2021-04-19

## 2021-03-19 NOTE — Telephone Encounter (Signed)
Per PCP, "Needs to make get lab tests done and make follow up appointment with PCP at [facility] in order to receive additional future refills"      One refill (30 days) authorized.    Please schedule follow up visit.

## 2021-03-21 MED ORDER — FAMOTIDINE 20 MG OR TABS
ORAL_TABLET | ORAL | 0 refills | Status: DC
Start: 2021-03-21 — End: 2021-04-19

## 2021-04-12 ENCOUNTER — Encounter (HOSPITAL_BASED_OUTPATIENT_CLINIC_OR_DEPARTMENT_OTHER): Payer: No Typology Code available for payment source | Admitting: Internal Medicine

## 2021-04-17 ENCOUNTER — Other Ambulatory Visit (HOSPITAL_BASED_OUTPATIENT_CLINIC_OR_DEPARTMENT_OTHER): Payer: Self-pay | Admitting: Nurse Practitioner

## 2021-04-17 DIAGNOSIS — F259 Schizoaffective disorder, unspecified: Secondary | ICD-10-CM

## 2021-04-17 DIAGNOSIS — E119 Type 2 diabetes mellitus without complications: Secondary | ICD-10-CM

## 2021-04-17 DIAGNOSIS — J45909 Unspecified asthma, uncomplicated: Secondary | ICD-10-CM

## 2021-04-17 DIAGNOSIS — J309 Allergic rhinitis, unspecified: Secondary | ICD-10-CM

## 2021-04-17 DIAGNOSIS — K219 Gastro-esophageal reflux disease without esophagitis: Secondary | ICD-10-CM

## 2021-04-17 DIAGNOSIS — I10 Essential (primary) hypertension: Secondary | ICD-10-CM

## 2021-04-17 DIAGNOSIS — E538 Deficiency of other specified B group vitamins: Secondary | ICD-10-CM

## 2021-04-19 MED ORDER — LORATADINE 10 MG OR TABS
ORAL_TABLET | ORAL | 1 refills | Status: DC
Start: 2021-04-19 — End: 2021-06-27

## 2021-04-19 MED ORDER — METFORMIN HCL 1000 MG OR TABS
ORAL_TABLET | ORAL | 1 refills | Status: DC
Start: 2021-04-19 — End: 2021-06-27

## 2021-04-19 MED ORDER — HYDROCHLOROTHIAZIDE 25 MG OR TABS
ORAL_TABLET | ORAL | 1 refills | Status: DC
Start: 2021-04-19 — End: 2021-06-27

## 2021-04-19 MED ORDER — DIVALPROEX SODIUM 250 MG OR TBEC
750.0000 mg | DELAYED_RELEASE_TABLET | Freq: Every evening | ORAL | 1 refills | Status: DC
Start: 2021-04-19 — End: 2021-06-27

## 2021-04-19 MED ORDER — ALBUTEROL SULFATE HFA 108 (90 BASE) MCG/ACT IN AERS
2.0000 | INHALATION_SPRAY | Freq: Four times a day (QID) | RESPIRATORY_TRACT | 1 refills | Status: DC | PRN
Start: 2021-04-19 — End: 2021-08-14

## 2021-04-19 MED ORDER — LISINOPRIL 40 MG OR TABS
ORAL_TABLET | ORAL | 1 refills | Status: DC
Start: 2021-04-19 — End: 2021-06-27

## 2021-04-19 MED ORDER — PRAVASTATIN SODIUM 40 MG OR TABS
ORAL_TABLET | ORAL | 1 refills | Status: DC
Start: 2021-04-19 — End: 2021-06-27

## 2021-04-19 MED ORDER — FAMOTIDINE 20 MG OR TABS
20.0000 mg | ORAL_TABLET | Freq: Two times a day (BID) | ORAL | 1 refills | Status: DC
Start: 2021-04-19 — End: 2021-06-27

## 2021-04-19 MED ORDER — CYANOCOBALAMIN 1000 MCG OR TABS
1000.0000 ug | ORAL_TABLET | Freq: Every day | ORAL | 1 refills | Status: DC
Start: 2021-04-19 — End: 2021-06-27

## 2021-04-19 MED ORDER — POTASSIUM CHLORIDE ER 10 MEQ OR TBCR
EXTENDED_RELEASE_TABLET | ORAL | 1 refills | Status: DC
Start: 2021-04-19 — End: 2021-06-27

## 2021-04-19 NOTE — Telephone Encounter (Signed)
NV 06/06/21

## 2021-06-06 ENCOUNTER — Encounter (HOSPITAL_BASED_OUTPATIENT_CLINIC_OR_DEPARTMENT_OTHER): Payer: No Typology Code available for payment source | Admitting: Internal Medicine

## 2021-06-18 ENCOUNTER — Other Ambulatory Visit: Payer: Self-pay | Admitting: Gastroenterology

## 2021-06-18 DIAGNOSIS — Z1211 Encounter for screening for malignant neoplasm of colon: Secondary | ICD-10-CM

## 2021-06-25 ENCOUNTER — Other Ambulatory Visit (HOSPITAL_BASED_OUTPATIENT_CLINIC_OR_DEPARTMENT_OTHER): Payer: Self-pay | Admitting: Nurse Practitioner

## 2021-06-25 DIAGNOSIS — I10 Essential (primary) hypertension: Secondary | ICD-10-CM

## 2021-06-25 DIAGNOSIS — E538 Deficiency of other specified B group vitamins: Secondary | ICD-10-CM

## 2021-06-25 DIAGNOSIS — J309 Allergic rhinitis, unspecified: Secondary | ICD-10-CM

## 2021-06-25 DIAGNOSIS — F259 Schizoaffective disorder, unspecified: Secondary | ICD-10-CM

## 2021-06-25 DIAGNOSIS — K219 Gastro-esophageal reflux disease without esophagitis: Secondary | ICD-10-CM

## 2021-06-25 DIAGNOSIS — E119 Type 2 diabetes mellitus without complications: Secondary | ICD-10-CM

## 2021-06-27 MED ORDER — LISINOPRIL 40 MG OR TABS
ORAL_TABLET | ORAL | 0 refills | Status: DC
Start: 2021-06-27 — End: 2021-08-14

## 2021-06-27 MED ORDER — METFORMIN HCL 1000 MG OR TABS
ORAL_TABLET | ORAL | 0 refills | Status: DC
Start: 2021-06-27 — End: 2021-08-14

## 2021-06-27 MED ORDER — HYDROCHLOROTHIAZIDE 25 MG OR TABS
ORAL_TABLET | ORAL | 0 refills | Status: DC
Start: 2021-06-27 — End: 2021-08-14

## 2021-06-27 MED ORDER — POTASSIUM CHLORIDE ER 10 MEQ OR TBCR
EXTENDED_RELEASE_TABLET | ORAL | 0 refills | Status: DC
Start: 2021-06-27 — End: 2021-08-14

## 2021-06-27 MED ORDER — LORATADINE 10 MG OR TABS
ORAL_TABLET | ORAL | 0 refills | Status: DC
Start: 2021-06-27 — End: 2021-08-14

## 2021-06-27 MED ORDER — DIVALPROEX SODIUM 250 MG OR TBEC
750.0000 mg | DELAYED_RELEASE_TABLET | Freq: Every evening | ORAL | 0 refills | Status: DC
Start: 2021-06-27 — End: 2021-08-20

## 2021-06-27 MED ORDER — CYANOCOBALAMIN 1000 MCG OR TABS
1000.0000 ug | ORAL_TABLET | Freq: Every day | ORAL | 0 refills | Status: DC
Start: 2021-06-27 — End: 2021-08-14

## 2021-06-27 MED ORDER — FAMOTIDINE 20 MG OR TABS
ORAL_TABLET | ORAL | 0 refills | Status: DC
Start: 2021-06-27 — End: 2021-08-14

## 2021-06-27 MED ORDER — PRAVASTATIN SODIUM 40 MG OR TABS
ORAL_TABLET | ORAL | 0 refills | Status: DC
Start: 2021-06-27 — End: 2021-08-14

## 2021-06-27 NOTE — Telephone Encounter (Signed)
Cancelled 06/06/21 appt  Needs to reschedule       One refill authorized.  Please schedule follow up visit.

## 2021-08-14 ENCOUNTER — Other Ambulatory Visit (HOSPITAL_BASED_OUTPATIENT_CLINIC_OR_DEPARTMENT_OTHER): Payer: Self-pay | Admitting: Nurse Practitioner

## 2021-08-14 ENCOUNTER — Telehealth (HOSPITAL_BASED_OUTPATIENT_CLINIC_OR_DEPARTMENT_OTHER): Payer: Self-pay | Admitting: Nurse Practitioner

## 2021-08-14 DIAGNOSIS — J309 Allergic rhinitis, unspecified: Secondary | ICD-10-CM

## 2021-08-14 DIAGNOSIS — I10 Essential (primary) hypertension: Secondary | ICD-10-CM

## 2021-08-14 DIAGNOSIS — K219 Gastro-esophageal reflux disease without esophagitis: Secondary | ICD-10-CM

## 2021-08-14 DIAGNOSIS — J45909 Unspecified asthma, uncomplicated: Secondary | ICD-10-CM

## 2021-08-14 DIAGNOSIS — E538 Deficiency of other specified B group vitamins: Secondary | ICD-10-CM

## 2021-08-14 DIAGNOSIS — E119 Type 2 diabetes mellitus without complications: Secondary | ICD-10-CM

## 2021-08-14 MED ORDER — HYDROCHLOROTHIAZIDE 25 MG OR TABS
ORAL_TABLET | ORAL | 0 refills | Status: DC
Start: 2021-08-14 — End: 2021-08-20

## 2021-08-14 MED ORDER — POTASSIUM CHLORIDE ER 10 MEQ OR TBCR
EXTENDED_RELEASE_TABLET | ORAL | 0 refills | Status: DC
Start: 2021-08-14 — End: 2021-08-20

## 2021-08-14 MED ORDER — ALBUTEROL SULFATE HFA 108 (90 BASE) MCG/ACT IN AERS
INHALATION_SPRAY | RESPIRATORY_TRACT | 0 refills | Status: DC
Start: 2021-08-14 — End: 2021-08-20

## 2021-08-14 MED ORDER — LISINOPRIL 40 MG OR TABS
ORAL_TABLET | ORAL | 0 refills | Status: DC
Start: 2021-08-14 — End: 2021-08-20

## 2021-08-14 MED ORDER — PRAVASTATIN SODIUM 40 MG OR TABS
ORAL_TABLET | ORAL | 0 refills | Status: DC
Start: 2021-08-14 — End: 2021-08-20

## 2021-08-14 MED ORDER — FAMOTIDINE 20 MG OR TABS
ORAL_TABLET | ORAL | 0 refills | Status: DC
Start: 2021-08-14 — End: 2021-08-20

## 2021-08-14 MED ORDER — METFORMIN HCL 1000 MG OR TABS
ORAL_TABLET | ORAL | 0 refills | Status: DC
Start: 2021-08-14 — End: 2021-08-20

## 2021-08-14 MED ORDER — LORATADINE 10 MG OR TABS
ORAL_TABLET | ORAL | 0 refills | Status: DC
Start: 2021-08-14 — End: 2021-08-20

## 2021-08-14 MED ORDER — VITAMIN B-12 1000 MCG OR TABS
ORAL_TABLET | ORAL | 0 refills | Status: DC
Start: 2021-08-14 — End: 2021-08-20

## 2021-08-14 NOTE — Telephone Encounter (Signed)
Authorized one refill, get updated prescription at upcoming appointment

## 2021-08-14 NOTE — Telephone Encounter (Signed)
There is already a pending request for these refills

## 2021-08-14 NOTE — Telephone Encounter (Signed)
RETURN CALL: Voicemail - Detailed Message      SUBJECT:  Medication Questions     NAME OF MEDICATION(S):     Pravastatin  Famotidine  Metformin  Loratadine  Lisinopril  Hydrochlorothiazide  Potassium  Divalproex  Selenium Sulfide     CONCERNS/QUESTIONS: Justin Zavala is requesting a emergency refill of above medication due to patient being out of medication. Patient is scheduled for 08/20/2021. Please advise.    ADDITIONAL INFORMATION: N/A

## 2021-08-17 ENCOUNTER — Telehealth (HOSPITAL_BASED_OUTPATIENT_CLINIC_OR_DEPARTMENT_OTHER): Payer: Self-pay | Admitting: Internal Medicine

## 2021-08-17 NOTE — Telephone Encounter (Signed)
RETURN CALL: Voicemail - Detailed Message    SUBJECT:  Cancellation/Reschedule Request     REASON: sister [name] asking for appointment 11am or later on 5/15    ADDITIONAL INFORMATION:   Malia calling in - case manager [name] made appointment for patient on 5/15 but the timing will be difficult for sister [name] to bring patient to the appointment. She was not advised that the appointment was made until today's reminder text.    She requests that the clinic please call her to see if any other appointment times are available on 5/15? For instance, any time after 11am would work better.    [facility] has not changed existing appointment at this time as [facility] only can book for times later in the week, and patient is out of medications.   Please call Malia at earliest convenience to advise of our availability.

## 2021-08-20 ENCOUNTER — Telehealth (HOSPITAL_BASED_OUTPATIENT_CLINIC_OR_DEPARTMENT_OTHER): Payer: Self-pay | Admitting: Nurse Practitioner

## 2021-08-20 ENCOUNTER — Ambulatory Visit: Payer: No Typology Code available for payment source | Attending: Nurse Practitioner | Admitting: Nurse Practitioner

## 2021-08-20 DIAGNOSIS — F259 Schizoaffective disorder, unspecified: Secondary | ICD-10-CM | POA: Insufficient documentation

## 2021-08-20 DIAGNOSIS — K219 Gastro-esophageal reflux disease without esophagitis: Secondary | ICD-10-CM | POA: Insufficient documentation

## 2021-08-20 DIAGNOSIS — J45909 Unspecified asthma, uncomplicated: Secondary | ICD-10-CM | POA: Insufficient documentation

## 2021-08-20 DIAGNOSIS — I10 Essential (primary) hypertension: Secondary | ICD-10-CM | POA: Insufficient documentation

## 2021-08-20 DIAGNOSIS — L210 Seborrhea capitis: Secondary | ICD-10-CM | POA: Insufficient documentation

## 2021-08-20 DIAGNOSIS — J309 Allergic rhinitis, unspecified: Secondary | ICD-10-CM | POA: Insufficient documentation

## 2021-08-20 DIAGNOSIS — E538 Deficiency of other specified B group vitamins: Secondary | ICD-10-CM | POA: Insufficient documentation

## 2021-08-20 DIAGNOSIS — E119 Type 2 diabetes mellitus without complications: Secondary | ICD-10-CM | POA: Insufficient documentation

## 2021-08-20 MED ORDER — PALIPERIDONE PALMITATE ER 234 MG/1.5ML IM SUSY
234.0000 mg | PREFILLED_SYRINGE | INTRAMUSCULAR | 6 refills | Status: AC
Start: 2021-08-20 — End: 2021-10-16

## 2021-08-20 MED ORDER — AEROCHAMBER Z-STAT PLUS MISC
0 refills | Status: DC
Start: 2021-08-20 — End: 2022-01-16

## 2021-08-20 MED ORDER — PRAVASTATIN SODIUM 40 MG OR TABS
40.0000 mg | ORAL_TABLET | Freq: Every evening | ORAL | 6 refills | Status: DC
Start: 2021-08-20 — End: 2021-10-16

## 2021-08-20 MED ORDER — HYDROCHLOROTHIAZIDE 25 MG OR TABS
ORAL_TABLET | ORAL | 6 refills | Status: DC
Start: 2021-08-20 — End: 2021-10-16

## 2021-08-20 MED ORDER — LORATADINE 10 MG OR TABS
ORAL_TABLET | ORAL | 6 refills | Status: DC
Start: 2021-08-20 — End: 2021-10-16

## 2021-08-20 MED ORDER — SELENIUM SULFIDE 2.5 % EX LOTN
TOPICAL_LOTION | CUTANEOUS | 11 refills | Status: DC
Start: 2021-08-21 — End: 2021-10-16

## 2021-08-20 MED ORDER — POTASSIUM CHLORIDE ER 10 MEQ OR TBCR
20.0000 meq | EXTENDED_RELEASE_TABLET | Freq: Every day | ORAL | 6 refills | Status: DC
Start: 2021-08-20 — End: 2021-10-16

## 2021-08-20 MED ORDER — FAMOTIDINE 20 MG OR TABS
20.0000 mg | ORAL_TABLET | Freq: Two times a day (BID) | ORAL | 6 refills | Status: DC
Start: 2021-08-20 — End: 2021-10-16

## 2021-08-20 MED ORDER — METFORMIN HCL 1000 MG OR TABS
1000.0000 mg | ORAL_TABLET | Freq: Two times a day (BID) | ORAL | 6 refills | Status: DC
Start: 2021-08-20 — End: 2021-10-16

## 2021-08-20 MED ORDER — CYANOCOBALAMIN 1000 MCG OR TABS
ORAL_TABLET | ORAL | 6 refills | Status: DC
Start: 2021-08-20 — End: 2021-10-16

## 2021-08-20 MED ORDER — ALBUTEROL SULFATE HFA 108 (90 BASE) MCG/ACT IN AERS
INHALATION_SPRAY | RESPIRATORY_TRACT | 6 refills | Status: DC
Start: 2021-08-20 — End: 2021-10-16

## 2021-08-20 MED ORDER — DIVALPROEX SODIUM 250 MG OR TBEC
750.0000 mg | DELAYED_RELEASE_TABLET | Freq: Every evening | ORAL | 6 refills | Status: DC
Start: 2021-08-20 — End: 2021-10-16

## 2021-08-20 MED ORDER — LISINOPRIL 40 MG OR TABS
ORAL_TABLET | ORAL | 6 refills | Status: DC
Start: 2021-08-20 — End: 2021-10-16

## 2021-08-20 NOTE — Progress Notes (Signed)
Adult Medicine Clinic - Outpatient Return Clinic Note    DATE: 08/20/2021     Justin Zavala is a 56 year old male who is here today for routine PE and med refills. Lat evaluated on 08/03/20. An interpreter was not needed for the visit.    PROBLEM LIST:  Patient Active Problem List    Diagnosis Date Noted   . Extrinsic asthma 01/04/2011   . Chronic Diarrhea, concern for IBD 10/01/2019     chronic diarrhea and hx of ? fecal incontinence prompted work up w/ GI notable for elevated CRP, CT scan with inflammatory changes in bowel. Multiple attempts for colonoscopy in past but never able to obtain. Concern for underlying IBD. Repeat CT scan was ordered to assess if inflammatory changes seen previously still present -- scan with ongoing thickening of the prox jejunum and distal ileum. In light of persistent changes >1 year think that a colonoscopy is warranted.Also has had weight loss of unclear etiology--unsure if related, but important to rule out IBD.  Very difficult to assess symptoms based on history alone.  Working with GI towards obtaining colonoscopy.      . Leukopenia 09/18/2016     Low normal WBC times years; worseed and was diagnosed with B12 deficiency--returned to low normal following supplementation. Smear showed no WBC abnormalities. Previously HIV, hep C negative. Ddx includes 2/2 med side effects (specifically valproate), may also be benign variant given chronically low normal values.       . B12 deficiency 09/18/2016   . Anemia 09/18/2016     Due to B12 deficiency; improved with supplementation      . Schizophrenia ([place]) 09/13/2013     Per chart review  Seen at [facility].     . GERD (gastroesophageal reflux disease) 09/13/2013   . Hypertension 07/08/2013   . Health care maintenance 04/22/2011   . 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 Should be called after appointments to update him on any changes  Mother [name]  can be reached at [phone]   [name] can be reached at work number [phone]  Has a caregiver a few times a week for cleaning, cooking, bathing assistance.     . Type 2 diabetes mellitus without complication, without long-term current use of insulin 04/20/2010     MEDICATIONS:  Current Outpatient Medications   Medication Sig Dispense Refill   . albuterol HFA 108 (90 Base) MCG/ACT inhaler INHALE 2 PUFFS BY MOUTH EVERY SIX HOURS AS NEEDED CX SHORTNESS OF BREATH 6.7 g 6   . cyanocobalamin (Vitamin B-12) 1000 MCG tablet TAKE 1 TABLET BY MOUTH DAILY 28 tablet 6   . divalproex 250 MG DR tablet Take 3 tablets (750 mg) by mouth at bedtime. 84 tablet 6   . famotidine 20 MG tablet Take 1 tablet (20 mg) by mouth 2 times a day. 56 tablet 6   . hydroCHLOROthiazide 25 MG tablet TAKE 1 TABLET BY MOUTH DAILY 28 tablet 6   . lisinopril 40 MG tablet TAKE 1 TABLET BY MOUTH DAILY 28 tablet 6   . loratadine 10 MG tablet TAKE 1 TABLET BY MOUTH DAILY 28 tablet 6   . metFORMIN 1000 MG tablet Take 1 tablet (1,000 mg) by mouth 2 times a day. 56 tablet 6   . paliperidone palmitate 234 MG/1.5ML suspension prefilled syringe Inject 1.5 mL (234 mg) intramuscularly every 30 days. 1.5 mL 6   . potassium chloride ER  10 MEQ ER tablet Take 2 tablets (20 mEq) by mouth daily. 56 tablet 6   . pravastatin 40 MG tablet Take 1 tablet (40 mg) by mouth every evening. 28 tablet 6   . [START ON 08/21/2021] selenium sulfide 2.5 % lotion Apply topically 4 times a week. Apply to scalp and beard area 118 mL 11   . Spacer/Aero-Holding Chambers (aerochamber plus) Use with inhaler as directed. 1 each 0     No current facility-administered medications for this visit.     ALLERGIES:  Review of patient's allergies indicates:  No Known Allergies  CHIEF COMPLAINT: No chief complaint on file.    INTERVAL HISTORY/HPI:  Has been doing well per pt. and supportive sister who oversees his care. C/o extra dandruff in the back of his head, or at least that's what his barber  says?    PHYSICAL EXAM:  BP (!) 157/49   Pulse 88   Temp 36.2 C (Temporal)   Resp 20   Wt 78.5 kg (173 lb)   SpO2 100%   BMI 32.69 kg/m   Pleasant WNWD well groomed AA male in NAD  HEENT: MMM, sclera clr. Few flakes of scattered dander in the occipital /nape region  Lungs: CTA  CV: RRR, no M  LE's: no e/c/c  Psych: affect somewhat flat, cooperative, says he is doing well    LAB RESULTS:  See below    ASSESSMENT AND PLAN:  Diagnoses and all orders for this visit:    Extrinsic asthma, unspecified asthma severity, uncomplicated  -     albuterol HFA 108 (90 Base) MCG/ACT inhaler; INHALE 2 PUFFS BY MOUTH EVERY SIX HOURS AS NEEDED CX SHORTNESS OF BREATH  -     Spacer/Aero-Holding Chambers (aerochamber plus); Use with inhaler as directed.    Schizoaffective disorder, unspecified type ([place])  -     divalproex 250 MG DR tablet; Take 3 tablets (750 mg) by mouth at bedtime.  -     paliperidone palmitate 234 MG/1.5ML suspension prefilled syringe; Inject 1.5 mL (234 mg) intramuscularly every 30 days.    Gastroesophageal reflux disease, unspecified whether esophagitis present  -     famotidine 20 MG tablet; Take 1 tablet (20 mg) by mouth 2 times a day.    Essential hypertension  -     hydroCHLOROthiazide 25 MG tablet; TAKE 1 TABLET BY MOUTH DAILY  -     lisinopril 40 MG tablet; TAKE 1 TABLET BY MOUTH DAILY  -     potassium chloride ER 10 MEQ ER tablet; Take 2 tablets (20 mEq) by mouth daily.    Type 2 diabetes mellitus without complication, without long-term current use of insulin (HCC)  -     metFORMIN 1000 MG tablet; Take 1 tablet (1,000 mg) by mouth 2 times a day.  -     pravastatin 40 MG tablet; Take 1 tablet (40 mg) by mouth every evening.    Allergic rhinitis, unspecified seasonality, unspecified trigger  -     loratadine 10 MG tablet; TAKE 1 TABLET BY MOUTH DAILY    B12 deficiency  -     cyanocobalamin (Vitamin B-12) 1000 MCG tablet; TAKE 1 TABLET BY MOUTH DAILY    Dandruff in adult  -    Increase selenium sulfide  2.5 % lotion; Apply topically 4 times a week. Apply to scalp and beard area      HCM  Scheduled f/u with PCP toreview todays results and other ongoing PC issues.

## 2021-08-20 NOTE — Telephone Encounter (Signed)
RETURN CALL: Voicemail - Detailed Message      SUBJECT:  General Message     MESSAGE: [name], [facility] Supervisor, is calling to verify whether pt attended his appointment this morning at 9:30 am. [name] is requesting an urgent call back to verify and discuss, as if pt is not seen today it will be an APS report as he has not been seen by his provider in a long time and has urgent health concerns that need to be addressed. Urgent per caller.

## 2021-08-20 NOTE — Telephone Encounter (Signed)
RN called [facility] Supervisor back, no answer, detailed message left saying pt was seen at [facility] today by NP Purcell Nails with call back number/option 2.    Note forwarded to PCP.

## 2021-10-03 ENCOUNTER — Ambulatory Visit (HOSPITAL_BASED_OUTPATIENT_CLINIC_OR_DEPARTMENT_OTHER): Payer: No Typology Code available for payment source | Admitting: Unknown Physician Specialty

## 2021-10-03 ENCOUNTER — Ambulatory Visit (HOSPITAL_BASED_OUTPATIENT_CLINIC_OR_DEPARTMENT_OTHER): Payer: No Typology Code available for payment source | Admitting: Nurse Practitioner

## 2021-10-16 ENCOUNTER — Other Ambulatory Visit (HOSPITAL_BASED_OUTPATIENT_CLINIC_OR_DEPARTMENT_OTHER): Payer: Self-pay | Admitting: Nurse Practitioner

## 2021-10-16 ENCOUNTER — Other Ambulatory Visit (HOSPITAL_BASED_OUTPATIENT_CLINIC_OR_DEPARTMENT_OTHER): Payer: Self-pay

## 2021-10-16 DIAGNOSIS — J45909 Unspecified asthma, uncomplicated: Secondary | ICD-10-CM

## 2021-10-16 DIAGNOSIS — F259 Schizoaffective disorder, unspecified: Secondary | ICD-10-CM

## 2021-10-16 DIAGNOSIS — E119 Type 2 diabetes mellitus without complications: Secondary | ICD-10-CM

## 2021-10-16 DIAGNOSIS — L210 Seborrhea capitis: Secondary | ICD-10-CM

## 2021-10-16 DIAGNOSIS — J309 Allergic rhinitis, unspecified: Secondary | ICD-10-CM

## 2021-10-16 DIAGNOSIS — I10 Essential (primary) hypertension: Secondary | ICD-10-CM

## 2021-10-16 DIAGNOSIS — K219 Gastro-esophageal reflux disease without esophagitis: Secondary | ICD-10-CM

## 2021-10-16 DIAGNOSIS — E538 Deficiency of other specified B group vitamins: Secondary | ICD-10-CM

## 2021-10-16 MED ORDER — SELENIUM SULFIDE 2.5 % EX LOTN
TOPICAL_LOTION | CUTANEOUS | 11 refills | Status: DC
Start: 2021-10-16 — End: 2022-01-16
  Filled 2021-10-16 – 2021-10-17 (×3): qty 120, 30d supply, fill #0

## 2021-10-16 MED ORDER — CYANOCOBALAMIN 1000 MCG OR TABS
ORAL_TABLET | ORAL | 6 refills | Status: DC
Start: 2021-10-16 — End: 2022-01-16
  Filled 2021-10-16: qty 28, fill #0

## 2021-10-16 MED ORDER — DIVALPROEX SODIUM 250 MG OR TBEC
750.0000 mg | DELAYED_RELEASE_TABLET | Freq: Every evening | ORAL | 6 refills | Status: DC
Start: 2021-10-16 — End: 2022-03-11
  Filled 2021-10-16: qty 84, 28d supply, fill #0
  Filled 2021-11-17: qty 84, 28d supply, fill #1

## 2021-10-16 MED ORDER — POTASSIUM CHLORIDE ER 10 MEQ OR TBCR
20.0000 meq | EXTENDED_RELEASE_TABLET | Freq: Every day | ORAL | 6 refills | Status: DC
Start: 2021-10-16 — End: 2022-01-16
  Filled 2021-10-16 – 2021-10-17 (×3): qty 56, 28d supply, fill #0
  Filled 2021-11-17: qty 56, 28d supply, fill #1

## 2021-10-16 MED ORDER — PRAVASTATIN SODIUM 40 MG OR TABS
40.0000 mg | ORAL_TABLET | Freq: Every evening | ORAL | 6 refills | Status: DC
Start: 2021-10-16 — End: 2022-01-16
  Filled 2021-10-16 – 2021-10-17 (×3): qty 28, 28d supply, fill #0
  Filled 2021-11-17: qty 28, 28d supply, fill #1

## 2021-10-16 MED ORDER — HYDROCHLOROTHIAZIDE 25 MG OR TABS
25.0000 mg | ORAL_TABLET | Freq: Every day | ORAL | 6 refills | Status: DC
Start: 2021-10-16 — End: 2021-11-07
  Filled 2021-10-16 – 2021-10-17 (×3): qty 28, 28d supply, fill #0

## 2021-10-16 MED ORDER — FAMOTIDINE 20 MG OR TABS
20.0000 mg | ORAL_TABLET | Freq: Two times a day (BID) | ORAL | 6 refills | Status: DC
Start: 2021-10-16 — End: 2022-01-16
  Filled 2021-10-16 – 2021-10-17 (×3): qty 56, 28d supply, fill #0
  Filled 2021-11-17: qty 56, 28d supply, fill #1

## 2021-10-16 MED ORDER — ALBUTEROL SULFATE HFA 108 (90 BASE) MCG/ACT IN AERS
INHALATION_SPRAY | RESPIRATORY_TRACT | 6 refills | Status: DC
Start: 2021-10-16 — End: 2022-01-16
  Filled 2021-10-16: qty 6.7, 25d supply, fill #0
  Filled 2021-11-17: qty 6.7, 25d supply, fill #1

## 2021-10-16 MED ORDER — METFORMIN HCL 1000 MG OR TABS
1000.0000 mg | ORAL_TABLET | Freq: Two times a day (BID) | ORAL | 6 refills | Status: DC
Start: 2021-10-16 — End: 2021-11-30
  Filled 2021-10-16 – 2021-10-17 (×3): qty 56, 28d supply, fill #0
  Filled 2021-11-17: qty 56, 28d supply, fill #1

## 2021-10-16 MED ORDER — LORATADINE 10 MG OR TABS
10.0000 mg | ORAL_TABLET | Freq: Every day | ORAL | 6 refills | Status: DC
Start: 2021-10-16 — End: 2022-01-16
  Filled 2021-10-16 – 2021-10-17 (×3): qty 28, 28d supply, fill #0
  Filled 2021-11-17: qty 28, 28d supply, fill #1

## 2021-10-16 MED ORDER — LISINOPRIL 40 MG OR TABS
40.0000 mg | ORAL_TABLET | Freq: Every day | ORAL | 6 refills | Status: DC
Start: 2021-10-16 — End: 2022-03-11
  Filled 2021-10-16 – 2021-10-17 (×3): qty 28, 28d supply, fill #0
  Filled 2021-11-17: qty 28, 28d supply, fill #1

## 2021-10-17 ENCOUNTER — Other Ambulatory Visit (HOSPITAL_BASED_OUTPATIENT_CLINIC_OR_DEPARTMENT_OTHER): Payer: Self-pay

## 2021-10-17 ENCOUNTER — Telehealth (HOSPITAL_BASED_OUTPATIENT_CLINIC_OR_DEPARTMENT_OTHER): Payer: Self-pay | Admitting: Pharmacist

## 2021-10-17 NOTE — Telephone Encounter (Signed)
Patient's sister [name] presented to [facility] front desk to ask about refills for patient.     Refills sent to [facility] on 10/16/21 by [name], ARNP but previously filled at [facility].     Called Malia's later today and confirmed she does want meds now to be filled at [facility] as she took over [place] recently (from [employer]). Meds already d/c'd at [facility].     Confirmed [facility] can fill meds for pt today - Malia will pick up tomorrow.

## 2021-10-18 ENCOUNTER — Other Ambulatory Visit (HOSPITAL_BASED_OUTPATIENT_CLINIC_OR_DEPARTMENT_OTHER): Payer: Self-pay

## 2021-10-19 ENCOUNTER — Other Ambulatory Visit (HOSPITAL_BASED_OUTPATIENT_CLINIC_OR_DEPARTMENT_OTHER): Payer: Self-pay

## 2021-10-23 ENCOUNTER — Other Ambulatory Visit (HOSPITAL_BASED_OUTPATIENT_CLINIC_OR_DEPARTMENT_OTHER): Payer: Self-pay

## 2021-10-25 ENCOUNTER — Other Ambulatory Visit (HOSPITAL_BASED_OUTPATIENT_CLINIC_OR_DEPARTMENT_OTHER): Payer: Self-pay

## 2021-10-26 ENCOUNTER — Other Ambulatory Visit (HOSPITAL_BASED_OUTPATIENT_CLINIC_OR_DEPARTMENT_OTHER): Payer: Self-pay

## 2021-10-27 ENCOUNTER — Other Ambulatory Visit (HOSPITAL_BASED_OUTPATIENT_CLINIC_OR_DEPARTMENT_OTHER): Payer: Self-pay

## 2021-11-07 ENCOUNTER — Ambulatory Visit: Payer: No Typology Code available for payment source | Attending: Internal Medicine | Admitting: Internal Medicine

## 2021-11-07 ENCOUNTER — Other Ambulatory Visit (HOSPITAL_BASED_OUTPATIENT_CLINIC_OR_DEPARTMENT_OTHER): Payer: Self-pay | Admitting: Internal Medicine

## 2021-11-07 ENCOUNTER — Other Ambulatory Visit (HOSPITAL_BASED_OUTPATIENT_CLINIC_OR_DEPARTMENT_OTHER): Payer: Self-pay

## 2021-11-07 VITALS — BP 131/53 | HR 87 | Temp 97.0°F | Wt 162.0 lb

## 2021-11-07 DIAGNOSIS — F209 Schizophrenia, unspecified: Secondary | ICD-10-CM | POA: Insufficient documentation

## 2021-11-07 DIAGNOSIS — E119 Type 2 diabetes mellitus without complications: Secondary | ICD-10-CM | POA: Insufficient documentation

## 2021-11-07 DIAGNOSIS — R634 Abnormal weight loss: Secondary | ICD-10-CM

## 2021-11-07 DIAGNOSIS — I10 Essential (primary) hypertension: Secondary | ICD-10-CM | POA: Insufficient documentation

## 2021-11-07 DIAGNOSIS — Z23 Encounter for immunization: Secondary | ICD-10-CM | POA: Insufficient documentation

## 2021-11-07 LAB — CBC, DIFF
% Basophils: 0 %
% Eosinophils: 1 %
% Immature Granulocytes: 0 %
% Lymphocytes: 27 %
% Monocytes: 14 %
% Neutrophils: 58 %
% Nucleated RBC: 0 %
Absolute Eosinophil Count: 0.03 10*3/uL (ref 0.00–0.50)
Absolute Lymphocyte Count: 0.83 10*3/uL — ABNORMAL LOW (ref 1.00–4.80)
Basophils: 0 10*3/uL (ref 0.00–0.20)
Hematocrit: 39 % (ref 38.0–50.0)
Hemoglobin: 13.4 g/dL (ref 13.0–18.0)
Immature Granulocytes: 0 10*3/uL (ref 0.00–0.05)
MCH: 30.5 pg (ref 27.3–33.6)
MCHC: 34.4 g/dL (ref 32.2–36.5)
MCV: 89 fL (ref 81–98)
Monocytes: 0.44 10*3/uL (ref 0.00–0.80)
Neutrophils: 1.8 10*3/uL (ref 1.80–7.00)
Nucleated RBC: 0 10*3/uL
Platelet Count: 121 10*3/uL — ABNORMAL LOW (ref 150–400)
RBC: 4.4 10*6/uL (ref 4.40–5.60)
RDW-CV: 13 % (ref 11.0–14.5)
WBC: 3.1 10*3/uL — ABNORMAL LOW (ref 4.3–10.0)

## 2021-11-07 LAB — ALBUMIN/CREATININE RATIO, RANDOM URINE
Albumin (Micro), URN: 3.16 mg/dL
Albumin/Creatinine Ratio, URN: 14 mg/g{creat} (ref <30)
Creatinine/Unit, URN: 220 mg/dL

## 2021-11-07 LAB — URINALYSIS WITH REFLEX CULTURE
Bacteria, URN: NONE SEEN
Bilirubin (Qual), URN: NEGATIVE
Epith Cells_Renal/Trans,URN: NEGATIVE /HPF
Epith Cells_Squamous, URN: NEGATIVE /LPF
Glucose Qual, URN: NEGATIVE
Leukocyte Esterase, URN: NEGATIVE
Nitrite, URN: NEGATIVE
Occult Blood, URN: NEGATIVE
RBC, URN: NEGATIVE /HPF
Specific Gravity, URN: 1.026 g/mL (ref 1.006–1.027)
WBC, URN: NEGATIVE /HPF
pH, URN: 6 (ref 5.0–8.0)

## 2021-11-07 LAB — COMPREHENSIVE METABOLIC PANEL
ALT (GPT): 24 U/L (ref 10–48)
AST (GOT): 23 U/L (ref 9–38)
Albumin: 4.2 g/dL (ref 3.5–5.2)
Alkaline Phosphatase (Total): 78 U/L (ref 39–139)
Anion Gap: 6 (ref 4–12)
Bilirubin (Total): 0.9 mg/dL (ref 0.2–1.3)
Calcium: 9.4 mg/dL (ref 8.9–10.2)
Carbon Dioxide, Total: 30 meq/L (ref 22–32)
Chloride: 102 meq/L (ref 98–108)
Creatinine: 1 mg/dL (ref 0.51–1.18)
Glucose: 115 mg/dL (ref 62–125)
Potassium: 4.1 meq/L (ref 3.6–5.2)
Protein (Total): 6.6 g/dL (ref 6.0–8.2)
Sodium: 138 meq/L (ref 135–145)
Urea Nitrogen: 11 mg/dL (ref 8–21)
eGFR by CKD-EPI 2021: >60 mL/min/{1.73_m2} (ref >59)

## 2021-11-07 LAB — PSA, SCREENING: PSA, Screening: 0.72 ng/mL (ref 0.00–4.00)

## 2021-11-07 LAB — TSH WITH REFLEXIVE FREE T4: TSH with Reflexive Free T4: 1.372 u[IU]/mL (ref 0.400–5.000)

## 2021-11-07 MED ORDER — COVID-19MRNA BIVAL VACC PFIZER 30 MCG/0.3ML IM SUSP
INTRAMUSCULAR | Status: AC
Start: 2021-11-07 — End: 2021-11-07
  Filled 2021-11-07: qty 0.3

## 2021-11-07 MED ORDER — HYDROCHLOROTHIAZIDE 12.5 MG OR CAPS
12.5000 mg | ORAL_CAPSULE | Freq: Every day | ORAL | 3 refills | Status: DC
Start: 2021-11-07 — End: 2022-01-17
  Filled 2021-11-07: qty 30, 30d supply, fill #0

## 2021-11-07 MED ORDER — COVID-19MRNA BIVAL VACC PFIZER 30 MCG/0.3ML IM SUSP
30.0000 ug | Freq: Once | INTRAMUSCULAR | Status: AC
Start: 2021-11-07 — End: 2021-11-07
  Administered 2021-11-07: 30 ug via INTRAMUSCULAR

## 2021-11-07 NOTE — Patient Instructions (Signed)
Today you are getting a COVID 19 booster.     Go to the lab for blood tests    Decrease the HCTZ (hydrochlorothiazide) to 12.5 mg once per day.  This is half of the old dose.  A prescription for this has been sent to the pharmacy.    Bring back the stool test so we can check for things like early colon cancer.     We will see you again in about 2 months.

## 2021-11-07 NOTE — Progress Notes (Signed)
Chief Complaint   Patient presents with    Wellness          Subjective:     Justin Zavala is a 56 year old male who presents on 11/07/2021.    I last saw patient myself 08/03/20 to establish new primary care. He has developmental delay, and HTN, B12 deficiency, DM2, Asthma, GERD, and schizophrenia managed through [facility] with IM palperidone and divalproex. He came with his sister Justin Zavala at that visit who works in our radiology department. At that time I ordered A1c, UAlb/Cr, refilled his metformin and pravastatin for his diabetes, his HCTZ and lisinopril and potassium for his HTN; and albuterol for his asthma.  I noted then that he would need foot exam and eye care (needs in person ophtho, did not tolerate photos).  We discussed possibility of colonoscopy but instead opted to try FOBT instead.     Patient came to see Justin Zavala from our clinic on 08/20/21 and at that time she refilled medications. There have been no labs done in our system since 02/2019.     Sister notes unintentional weight loss.  Patient endorses possible increased urinary frequency, although sister has not noted.  No dyspnea, PND, chest pain, orthopnea. However, his sister has noted some tremor recently.  Denies lightheadedness.      12/31/2017 02/23/2018 03/08/2019 07/21/2020 08/03/2020   Vitals        Weight (pounds) 193 lb  198 lb  169 lb  194 lb  196 lb    Weight (kg) 87.544 kg  89.812 kg  76.658 kg  87.998 kg  88.905 kg       08/20/2021 11/07/2021   Vitals     Weight (pounds) 173 lb  162 lb    Weight (kg) 78.472 kg  73.483 kg             Objective:    Vitals: BP (!) 131/53   Pulse 87   Temp 36.1 C (Temporal)   Wt 73.5 kg (162 lb)   SpO2 100% Comment: ra  BMI 30.61 kg/m   Physical Exam  GEN: pleasant, no acute distress.   HEENT: anicteric  NECK: supple, no LAN  CHEST: clear  CV: regular S1 S2 without MRG, trace pretibial edema.   NEURO: some very slight finger to nose intention tremor to R>L.     LABS:  Lab Results   Component Value  Date    SODIUM 138 11/07/2021    POTASSIUM 4.1 11/07/2021    CL 102 11/07/2021    CO2 30 11/07/2021    BUN 11 11/07/2021    CREATININE 1.00 11/07/2021    GLUCOSE 115 11/07/2021    CA 9.4 11/07/2021    AST 23 11/07/2021    ALT 24 11/07/2021    ALK 78 11/07/2021    PROTEIN 6.6 11/07/2021    ALBUMIN 4.2 11/07/2021     Lab Results   Component Value Date    WBC 3.10 (L) 11/07/2021    RBC 4.40 11/07/2021    HEMOGLOBIN 13.4 11/07/2021    HEMATOCRIT 39 11/07/2021    MCV 89 11/07/2021    MCH 30.5 11/07/2021    MCHC 34.4 11/07/2021    PLATELET 121 (L) 11/07/2021    RDWCV 13.0 11/07/2021     No results found for: APPEARANCEU  Lab Results   Component Value Date    SPGRAVU 1.026 11/07/2021     Lab Results   Component Value Date    PHU 6.0  11/07/2021     Lab Results   Component Value Date    PROTEINU Trace 11/07/2021     Lab Results   Component Value Date    GLUCOSEU Negative 11/07/2021     Lab Results   Component Value Date    KETONEU Trace 11/07/2021     Lab Results   Component Value Date    BILIRUBINU Negative 11/07/2021     Lab Results   Component Value Date    OCCLTBLDU Negative 11/07/2021     Lab Results   Component Value Date    NITRITESU Negative 11/07/2021     No results found for: LEUKOCYTES  Lab Results   Component Value Date    UROBILINGNU 0.1-1.9 11/07/2021     No results found for: COMMENTU  Lab Results   Component Value Date    LEUKU 0-5(NEG) 11/07/2021     Lab Results   Component Value Date    RBCU 0-2(NEG) 11/07/2021     No results found for: EPITHCELLSU  No results found for: CASTU  No results found for: CRYSTLU  Lab Results   Component Value Date    BACTERIAU Not Seen 11/07/2021     No results found for: OTHROBSU    PSA 0.72  UAlb/Cr 14       Assessment and Plan:      Justin Zavala was seen today for wellness.    Unintentional weight loss  Comments:  Weight often up and down, but down by around 28 lbs since last year. Will check UA and serum glucose, check PSA and FOBT to look for cancer as potential cause. Check TSH  as well as CBC. No smoking to lead me to check chest imaging, no lymphadenopathy. Follow as we watch for these results.   Orders:  -     TSH w/Reflexive Free T4; Future  -     CBC with Diff; Future  -     Urinalysis with Reflex Culture; Future  -     Occult Blood by Immunoassay, Stool; Future  -     PSA, Screening; Future    Type 2 diabetes mellitus without complication, without long-term current use of insulin  Comments:  Will check glucose and A1c to ensure not having weight loss because of hyperglycemia. Will plan foot exam and eye referral next visit. No changes to metformin. Check for microalbuminuria.   Orders:  -     Hemoglobin A1C, HPLC; Future  -     Urinalysis with Reflex Culture; Future  -     Albumin/Creatinine Ratio, Random Urine; Future    Essential hypertension  Comments:  Diastolic BP is low today. Some tremor, but not much on my exam today. Will halve hydrochlorothiazide today and check Cr and K.  Orders:  -     hydroCHLOROthiazide 12.5 MG tablet; Take 1 tablet (12.5 mg) by mouth daily.  Dispense: 30 tablet; Refill: 3    Primary hypertension  -     Comprehensive Metabolic Panel; Future    Schizophrenia, unspecified type [facility])  Comments:  Deferred to [facility], but getting monthly injectable IM antipsychotic which seems to be working well for him.    Other orders  -     COVID-19 mRNA BIVALENT vaccine (Pfizer 12 yrs and older) injection 30 mcg    See in around 2-3 months, will review results then, will follow weights and refer for eye exam then.     ADDENDUM:   CMP unremarkable, and UA without significant  glucosuria. Has unexpected mild leukocytosis and thrombocytopenia of uncertain significance. Some of this may have been present in past testing. Will repeat, and consider additional testing (e.g. HIV).     I spent a total of 41 minutes for the patient's care on the date of the service.

## 2021-11-08 LAB — HEMOGLOBIN A1C, HPLC: Hemoglobin A1C: 5.8 % (ref 4.0–6.0)

## 2021-11-10 ENCOUNTER — Other Ambulatory Visit (HOSPITAL_BASED_OUTPATIENT_CLINIC_OR_DEPARTMENT_OTHER): Payer: Self-pay

## 2021-11-16 ENCOUNTER — Other Ambulatory Visit (HOSPITAL_BASED_OUTPATIENT_CLINIC_OR_DEPARTMENT_OTHER): Payer: Self-pay

## 2021-11-17 ENCOUNTER — Other Ambulatory Visit (HOSPITAL_BASED_OUTPATIENT_CLINIC_OR_DEPARTMENT_OTHER): Payer: Self-pay

## 2021-11-19 ENCOUNTER — Other Ambulatory Visit (HOSPITAL_BASED_OUTPATIENT_CLINIC_OR_DEPARTMENT_OTHER): Payer: Self-pay

## 2021-11-29 ENCOUNTER — Other Ambulatory Visit (HOSPITAL_BASED_OUTPATIENT_CLINIC_OR_DEPARTMENT_OTHER): Payer: Self-pay

## 2021-11-30 ENCOUNTER — Encounter (HOSPITAL_BASED_OUTPATIENT_CLINIC_OR_DEPARTMENT_OTHER): Payer: Self-pay | Admitting: Nurse Practitioner

## 2021-11-30 ENCOUNTER — Ambulatory Visit: Payer: No Typology Code available for payment source | Attending: Nurse Practitioner | Admitting: Nurse Practitioner

## 2021-11-30 VITALS — BP 144/77 | HR 75 | Temp 98.1°F | Wt 160.2 lb

## 2021-11-30 DIAGNOSIS — F88 Other disorders of psychological development: Secondary | ICD-10-CM | POA: Insufficient documentation

## 2021-11-30 DIAGNOSIS — E119 Type 2 diabetes mellitus without complications: Secondary | ICD-10-CM | POA: Insufficient documentation

## 2021-11-30 DIAGNOSIS — F201 Disorganized schizophrenia: Secondary | ICD-10-CM | POA: Insufficient documentation

## 2021-11-30 NOTE — Progress Notes (Signed)
Adult Medicine Clinic - Outpatient Return Clinic Note    DATE: 11/30/2021     Justin Zavala is a 56 year old male who is here today with sister Shylo Dillenbeck regarding recent leter from [name] No interpreter needed.    PROBLEM LIST:  Patient Active Problem List    Diagnosis Date Noted    Extrinsic asthma 01/04/2011    Chronic Diarrhea, concern for IBD 10/01/2019     chronic diarrhea and hx of ? fecal incontinence prompted work up w/ GI notable for elevated CRP, CT scan with inflammatory changes in bowel.  Multiple attempts for colonoscopy in past but never able to obtain.  Concern for underlying IBD.  Repeat CT scan was ordered to assess if inflammatory changes seen previously still present -- scan with ongoing thickening of the prox jejunum and distal ileum.  In light of persistent changes > 1 year think that a colonoscopy is warranted.  Also has had weight loss of unclear etiology--unsure if related, but important to rule out IBD.   Very difficult to assess symptoms based on history alone.  Working with GI towards obtaining colonoscopy.       Leukopenia 09/18/2016     Low normal WBC times years; worseed and was diagnosed with B12 deficiency--returned to low normal following supplementation. Smear showed no WBC abnormalities.  Previously HIV, hep C negative. Ddx includes 2/2 med side effects (specifically valproate), may also be benign variant given chronically low normal values.           B12 deficiency 09/18/2016    Anemia 09/18/2016     Due to B12 deficiency; improved with supplementation       Schizophrenia ([place]) 09/13/2013     Per chart review  Seen at [facility].      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 Should be called after appointments to update him on any changes  Mother [name] can be reached at [phone]    [name] can be reached at work number [phone]  Has a caregiver a few times a week for cleaning, cooking, bathing assistance.      Type 2 diabetes mellitus without complication, without long-term current use of insulin 04/20/2010     MEDICATIONS:  Current Outpatient Medications   Medication Sig Dispense Refill    albuterol HFA 108 (90 Base) MCG/ACT inhaler INHALE 2 PUFFS BY MOUTH EVERY SIX HOURS AS NEEDED TO CONTROL SHORTNESS OF BREATH 6.7 g 6    cyanocobalamin (Vitamin B-12) 1000 MCG tablet TAKE 1 TABLET BY MOUTH DAILY 28 tablet 6    divalproex 250 MG DR tablet Take 3 tablets (750 mg) by mouth at bedtime. 84 tablet 6    famotidine 20 MG tablet Take 1 tablet (20 mg) by mouth 2 times a day. 56 tablet 6    hydroCHLOROthiazide 12.5 MG capsule Take 1 capsule (12.5 mg) by mouth daily. 30 capsule 3    lisinopril 40 MG tablet Take 1 tablet (40 mg) by mouth daily. 28 tablet 6    loratadine 10 MG tablet Take 1 tablet (10 mg) by mouth daily. 28 tablet 6    paliperidone palmitate 234 MG/1.5ML suspension prefilled syringe Inject 1.5 mL (234 mg) intramuscularly every 30 days. 1.5 mL 6    potassium chloride ER 10 MEQ ER tablet Take 2 tablets (20  mEq) by mouth daily. 56 tablet 6    pravastatin 40 MG tablet Take 1 tablet (40 mg) by mouth every evening. 28 tablet 6    selenium sulfide 2.5 % lotion Apply topically 4 times a week. Apply to scalp and beard area. 120 mL 11    Spacer/Aero-Holding Chambers (aerochamber plus) Use with inhaler as directed. 1 each 0     No current facility-administered medications for this visit.     ALLERGIES:  Review of patient's allergies indicates:  No Known Allergies  CHIEF COMPLAINT: No chief complaint on file.    INTERVAL HISTORY/HPI:  Here for letter request regarding recent APS letter.    PHYSICAL EXAM:  BP (!) 144/77   Pulse 75   Temp 36.7 C (Temporal)   Wt 72.7 kg (160 lb 3.2 oz)   SpO2 97%   BMI 30.27 kg/m   Pleasant AA male well groomed in in NAD  HEENT: MMM  Lungs: CTA  CV: RRR,  no M    LAB RESULTS:  None today    ASSESSMENT AND PLAN:  Diagnoses and all orders for this visit:    Disorganized schizophrenia ([place])    Type 2 diabetes mellitus without complication, without long-term current use of insulin ([place]), recent A1c 5.8  Discontinue Metformin for now.  Will defer monitor wt. and A1c.     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

## 2021-12-03 ENCOUNTER — Encounter (HOSPITAL_BASED_OUTPATIENT_CLINIC_OR_DEPARTMENT_OTHER): Payer: Self-pay | Admitting: Internal Medicine

## 2021-12-07 ENCOUNTER — Telehealth (HOSPITAL_BASED_OUTPATIENT_CLINIC_OR_DEPARTMENT_OTHER): Payer: Self-pay | Admitting: Internal Medicine

## 2021-12-07 NOTE — Telephone Encounter (Signed)
Called patient's sister Justin Zavala to let her know that I have unexpected travel out of town on the date of our scheduled follow up with her brother Justin Zavala, and need to reschedule that 01/10/22 appointment.  Offered possibly 01/07/22 as a date with openings.  Because she works at [employer], I suggested that she might come up to the front desk at [facility] [employer] to rschedule rather than to try to call to reschedule.  Will alert our front desk staff.

## 2021-12-11 ENCOUNTER — Telehealth (HOSPITAL_BASED_OUTPATIENT_CLINIC_OR_DEPARTMENT_OTHER): Payer: Self-pay | Admitting: Obstetrics/Gynecology

## 2021-12-11 NOTE — Telephone Encounter (Signed)
RN notified by Rennis Harding, PSS to call patient sister(Malia) ASAP regarding patient care "Hi carol this pt sister asked to call ASAP about this pt also message her in teams thank ".      RN called both numbers, no answer.  Left message x 2 on VM.  I see that Dr. Barrington Ellison had left a message on 12/07/21, not sure if that is the concern/follow up.      RN sent message via Epic chat to advise for Malia(sister) to call [facility] or come back up or send message via Epic/MyChart.    1230: Malia( sister) came back to clinic: Need to discuss administration of : paliperidone palmitate 234 MG/1.5ML suspension prefilled syringe Inject 1.5 mL (234 mg) intramuscularly every 30 days. 1.5 mL 6.     Malia stating that PA at [facility] was on vacation and concerned that patient would not be getting his medication. Malia had left two messages with no return phone call.     1245: RN called [phone], spoke with Brandy(Adm. Services) who stated that there is always someone to cover for administration of medications, to ensure that doses are not missed.  Patient can come anytime between 8-5pm and there will be a nurse available to give medications. RN sent message to Malia/Carol, NP via [employer].                  Eduard Roux, MD   Physician  Specialty: Internal Medicine     Telephone Encounter      Signed     Encounter Date: 12/07/2021       Called patient's sister [name] to let her know that I have unexpected travel out of town on the date of our scheduled follow up with her brother Tarance, and need to reschedule that 01/10/22 appointment.  Offered possibly 01/07/22 as a date with openings.  Because she works at [employer], I suggested that she might come up to the front desk at [facility] to rschedule rather than to try to call to reschedule.  Will alert our front desk staff.

## 2021-12-12 ENCOUNTER — Ambulatory Visit (HOSPITAL_BASED_OUTPATIENT_CLINIC_OR_DEPARTMENT_OTHER): Payer: No Typology Code available for payment source | Admitting: Nurse Practitioner

## 2022-01-10 ENCOUNTER — Ambulatory Visit (HOSPITAL_BASED_OUTPATIENT_CLINIC_OR_DEPARTMENT_OTHER): Payer: No Typology Code available for payment source | Admitting: Internal Medicine

## 2022-01-14 ENCOUNTER — Ambulatory Visit (HOSPITAL_BASED_OUTPATIENT_CLINIC_OR_DEPARTMENT_OTHER): Payer: No Typology Code available for payment source | Admitting: Internal Medicine

## 2022-01-14 NOTE — Progress Notes (Deleted)
{  [place]  [phone] E&M codes are now billed on either MDM or total time. You can document only what is medically necessary. :999}   No chief complaint on file.         Subjective: {VT  History items are no longer counted. You can document only relevant history and ROS items  Problem List  Medical Hx  Surgical Hx  Family Hx  Substance & Sexual Hx  Social Documentation  Obstetric Hx   Birth Hx :999}    Justin Zavala is a 56 year old male who presents on 01/14/2022.    I last saw patient 11/07/21.  Recall has h/o developmental/ cognitive delay, HTN, B12 deficiency, DM2, asthma, GERD, schizophrenia, with the latter managed through [facility] with IM palperidone and divalproex.  He came with his sister [name] to each of our visits, and she works in our radiology department, and had advocated heavily for many of his health care needs. His sister also noted unintentional weight loss, and in review noted 10-15 kg over the past year (73.5 kg 11/07/21).     I ordered TSH, CBC, UA, FOBT, PSA, A1c.  Because diastolic BP was low, I halved the HCTZ to 12.5 mg/day. I also gave the bivalent COVID booster (2022 version) at that 11/07/21 visit.     ***       Objective: {VT  Vitals Flowsheet  Labs  Imaging :999}   Vitals: There were no vitals taken for this visit.  Physical Exam  ***    Lab Results   Component Value Date    SODIUM 138 11/07/2021    POTASSIUM 4.1 11/07/2021    CL 102 11/07/2021    CO2 30 11/07/2021    BUN 11 11/07/2021    CREATININE 1.00 11/07/2021    GLUCOSE 115 11/07/2021    CA 9.4 11/07/2021    AST 23 11/07/2021    ALT 24 11/07/2021    ALK 78 11/07/2021    PROTEIN 6.6 11/07/2021    ALBUMIN 4.2 11/07/2021     Lab Results   Component Value Date    WBC 3.10 (L) 11/07/2021    RBC 4.40 11/07/2021    HEMOGLOBIN 13.4 11/07/2021    HEMATOCRIT 39 11/07/2021    MCV 89 11/07/2021    MCH 30.5 11/07/2021    MCHC 34.4 11/07/2021    PLATELET 121 (L) 11/07/2021    RDWCV 13.0 11/07/2021     PSA 0.72  TSH 1.4  A1c  5.8%       Assessment and Plan: {VT  Problem List  Meds & Orders  HM  Care Gaps :999}   {VT  LOS Calculator  Job aid  AMA's MDM grid :999}  {Assessment & Plan:115017}

## 2022-01-16 ENCOUNTER — Encounter (HOSPITAL_BASED_OUTPATIENT_CLINIC_OR_DEPARTMENT_OTHER): Payer: Self-pay | Admitting: Obstetrics/Gynecology

## 2022-01-16 ENCOUNTER — Ambulatory Visit: Payer: No Typology Code available for payment source | Attending: Nurse Practitioner | Admitting: Nurse Practitioner

## 2022-01-16 VITALS — BP 157/55 | HR 87 | Temp 99.1°F | Resp 20

## 2022-01-16 DIAGNOSIS — E538 Deficiency of other specified B group vitamins: Secondary | ICD-10-CM | POA: Insufficient documentation

## 2022-01-16 DIAGNOSIS — F201 Disorganized schizophrenia: Secondary | ICD-10-CM | POA: Insufficient documentation

## 2022-01-16 DIAGNOSIS — J309 Allergic rhinitis, unspecified: Secondary | ICD-10-CM | POA: Insufficient documentation

## 2022-01-16 DIAGNOSIS — L210 Seborrhea capitis: Secondary | ICD-10-CM | POA: Insufficient documentation

## 2022-01-16 DIAGNOSIS — J45909 Unspecified asthma, uncomplicated: Secondary | ICD-10-CM | POA: Insufficient documentation

## 2022-01-16 DIAGNOSIS — I10 Essential (primary) hypertension: Secondary | ICD-10-CM | POA: Insufficient documentation

## 2022-01-16 DIAGNOSIS — K219 Gastro-esophageal reflux disease without esophagitis: Secondary | ICD-10-CM | POA: Insufficient documentation

## 2022-01-16 DIAGNOSIS — E119 Type 2 diabetes mellitus without complications: Secondary | ICD-10-CM | POA: Insufficient documentation

## 2022-01-16 DIAGNOSIS — M436 Torticollis: Secondary | ICD-10-CM | POA: Insufficient documentation

## 2022-01-16 MED ORDER — FAMOTIDINE 20 MG OR TABS
20.0000 mg | ORAL_TABLET | Freq: Two times a day (BID) | ORAL | 6 refills | Status: DC | PRN
Start: 2022-01-16 — End: 2022-07-26

## 2022-01-16 MED ORDER — ALBUTEROL SULFATE HFA 108 (90 BASE) MCG/ACT IN AERS
INHALATION_SPRAY | RESPIRATORY_TRACT | 6 refills | Status: DC
Start: 2022-01-16 — End: 2022-06-06

## 2022-01-16 MED ORDER — PRAVASTATIN SODIUM 40 MG OR TABS
40.0000 mg | ORAL_TABLET | Freq: Every evening | ORAL | 6 refills | Status: DC
Start: 2022-01-16 — End: 2022-06-06

## 2022-01-16 MED ORDER — POTASSIUM CHLORIDE ER 10 MEQ OR TBCR
20.0000 meq | EXTENDED_RELEASE_TABLET | Freq: Every day | ORAL | 6 refills | Status: DC
Start: 2022-01-16 — End: 2022-06-06

## 2022-01-16 MED ORDER — SELENIUM SULFIDE 2.5 % EX LOTN
TOPICAL_LOTION | CUTANEOUS | 11 refills | Status: DC
Start: 2022-01-17 — End: 2022-07-26

## 2022-01-16 MED ORDER — LORATADINE 10 MG OR TABS
10.0000 mg | ORAL_TABLET | Freq: Every day | ORAL | 6 refills | Status: DC | PRN
Start: 2022-01-16 — End: 2022-06-06

## 2022-01-16 NOTE — Progress Notes (Signed)
Adult Medicine Clinic - Outpatient Return Clinic Note    DATE: 01/16/2022     Justin Zavala is a 56 year old male who is here today for 1 mo. Followup and med review.. An interpreter was not needed for the visit.    PROBLEM LIST:  Patient Active Problem List    Diagnosis Date Noted    Extrinsic asthma 01/04/2011    Chronic Diarrhea, concern for IBD 10/01/2019     chronic diarrhea and hx of ? fecal incontinence prompted work up w/ GI notable for elevated CRP, CT scan with inflammatory changes in bowel.  Multiple attempts for colonoscopy in past but never able to obtain.  Concern for underlying IBD.  Repeat CT scan was ordered to assess if inflammatory changes seen previously still present -- scan with ongoing thickening of the prox jejunum and distal ileum.  In light of persistent changes > 1 year think that a colonoscopy is warranted.  Also has had weight loss of unclear etiology--unsure if related, but important to rule out IBD.   Very difficult to assess symptoms based on history alone.  Working with GI towards obtaining colonoscopy.       Leukopenia 09/18/2016     Low normal WBC times years; worseed and was diagnosed with B12 deficiency--returned to low normal following supplementation. Smear showed no WBC abnormalities.  Previously HIV, hep C negative. Ddx includes 2/2 med side effects (specifically valproate), may also be benign variant given chronically low normal values.           B12 deficiency 09/18/2016    Anemia 09/18/2016     Due to B12 deficiency; improved with supplementation       Schizophrenia ([place]) 09/13/2013     Per chart review  Seen at [facility].      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 Should be called after appointments to update him on any changes  Mother [name] can be reached at [phone]    [name] can be reached at work number [phone]  Has a caregiver a few times a week for cleaning, cooking, bathing assistance.      Type 2 diabetes mellitus without complication, without long-term current use of insulin 04/20/2010     MEDICATIONS:  Current Outpatient Medications   Medication Sig Dispense Refill    albuterol HFA 108 (90 Base) MCG/ACT inhaler INHALE 2 PUFFS BY MOUTH EVERY SIX HOURS AS NEEDED TO CONTROL SHORTNESS OF BREATH 6.7 g 6    divalproex 250 MG DR tablet Take 3 tablets (750 mg) by mouth at bedtime. (Patient not taking: Reported on 01/16/2022) 84 tablet 6    famotidine 20 MG tablet Take 1 tablet (20 mg) by mouth 2 times a day as needed. 60 tablet 6    hydroCHLOROthiazide 12.5 MG capsule Take 1 capsule (12.5 mg) by mouth daily. (Patient not taking: Reported on 01/16/2022) 30 capsule 3    lisinopril 40 MG tablet Take 1 tablet (40 mg) by mouth daily. (Patient not taking: Reported on 01/16/2022) 28 tablet 6    loratadine 10 MG tablet Take 1 tablet (10 mg) by mouth daily as needed for allergies. 30 tablet 6    paliperidone palmitate 234 MG/1.5ML suspension prefilled syringe Inject 1.5 mL (234 mg) intramuscularly every 30 days. 1.5 mL 6    potassium chloride  ER 10 MEQ ER tablet Take 2 tablets (20 mEq) by mouth daily. 56 tablet 6    pravastatin 40 MG tablet Take 1 tablet (40 mg) by mouth every evening. 28 tablet 6    [START ON 01/17/2022] selenium sulfide 2.5 % lotion Apply topically 4 times a week. Apply to scalp and beard area. 120 mL 11     No current facility-administered medications for this visit.     ALLERGIES:  Review of patient's allergies indicates:  No Known Allergies  CHIEF COMPLAINT: No chief complaint on file.    INTERVAL HISTORY/HPI  Unfortunately his sister who was with him the past few visits had a CVA and is recovering at [facility], therefore his older sister who will be acting as his DPOA now and the [facility] case mgr. [name] and RN were present during todays visit.  Per case mgr. Pt. Has  not been taking any of his meds except for the IM injection given at [facility] q 30d.  Previously Depakote was also given by them, but ended up being rx'd by PCP.  Concern voiced re pt.'s stiff neck and poor posture probably related to minimal exercise and sitting in his kitchen chair to watch hours of TV during the day, per fmaily and [facility] team present.      PHYSICAL EXAM:  BP (!) 157/55   Pulse 87   Temp 37.3 C (Temporal)   Resp 20   SpO2 98%   Pleasant groomed AA male sitting leaning slightly forward on exam table in NAD  HEENT: MMM, multiple missing teeth on bottom.  Neck: unable to reach FROM in all directions, CS NTTP, mild kyphosis noted  Lungs: CTA  CV; RRR, no M  Abd: soft, NTND  LE's: no e/c/c    LAB RESULTS:  Next visit    ASSESSMENT AND PLAN:  Diagnoses and all orders for this visit:    Neck stiffness  -     XR C Spine 2-3 View; Future  -     Erythrocyte Sedimentation Rate; Future  -     C-Reactive Protein; Future  -     Referral to Physical Therapy - Ortho/Sports/Musculoskeletal; Future    Extrinsic asthma, unspecified asthma severity, uncomplicated  -     albuterol HFA 108 (90 Base) MCG/ACT inhaler; INHALE 2 PUFFS BY MOUTH EVERY SIX HOURS AS NEEDED TO CONTROL SHORTNESS OF BREATH    Gastroesophageal reflux disease, unspecified whether esophagitis present  -     famotidine 20 MG tablet; Take 1 tablet (20 mg) by mouth 2 times a day as needed.  -     Referral to Nutrition; Future    Essential hypertension  -     potassium chloride ER 10 MEQ ER tablet; Take 2 tablets (20 mEq) by mouth daily.  -     Comprehensive Metabolic Panel; Future  -     Lipid Panel; Future  -     Vitamin B12 (Cobalamin); Future  -     CBC; Future  -     Referral to Nutrition; Future    Type 2 diabetes mellitus without complication, without long-term current use of insulin ([facility])  -     pravastatin 40 MG tablet; Take 1 tablet (40 mg) by mouth every evening.  -     Hemoglobin A1C, HPLC; Future  -     TSH w/Reflexive Free T4;  Future  Continue to hold Metformin, recheck A1c after being off x 3 mo which will be when pt.  RTC for PCP visit.    Dandruff in adult  -     selenium sulfide 2.5 % lotion; Apply topically 4 times a week. Apply to scalp and beard area.    Allergic rhinitis, unspecified seasonality, unspecified trigger  -     loratadine 10 MG tablet; Take 1 tablet (10 mg) by mouth daily as needed for allergies.    Disorganized schizophrenia ([place])  Will defer to [facility] to rx. And monitor current medications.                      03/11/2022 3:30 PM (Arrive by 3:15 PM) Eduard Roux, MD [facility]

## 2022-01-17 ENCOUNTER — Telehealth (HOSPITAL_BASED_OUTPATIENT_CLINIC_OR_DEPARTMENT_OTHER): Payer: Self-pay

## 2022-01-17 MED ORDER — HYDROCHLOROTHIAZIDE 12.5 MG OR CAPS
12.5000 mg | ORAL_CAPSULE | Freq: Every day | ORAL | 3 refills | Status: DC
Start: 2022-01-17 — End: 2022-01-17

## 2022-01-17 MED ORDER — CYANOCOBALAMIN 1000 MCG OR TABS
ORAL_TABLET | ORAL | 6 refills | Status: DC
Start: 2022-01-17 — End: 2022-07-04

## 2022-01-17 MED ORDER — HYDROCHLOROTHIAZIDE 12.5 MG OR CAPS
12.5000 mg | ORAL_CAPSULE | Freq: Every day | ORAL | 3 refills | Status: DC
Start: 2022-01-17 — End: 2022-03-11

## 2022-01-17 NOTE — Telephone Encounter (Signed)
RETURN CALL: Voicemail - Detailed Message      SUBJECT:  General Message     MESSAGE: Received a call from pt's nurse.  Caller says pt was supposed to be prescribed blood pressure medication yesterday which he badly needs but pt did not receive this prescription.  Caller believes this may be a mistake by the provider.  Callback # [phone].

## 2022-01-17 NOTE — Addendum Note (Signed)
Addended by: [name] A on: 01/17/2022 05:55 PM     Modules accepted: Orders

## 2022-01-17 NOTE — Telephone Encounter (Signed)
RN called Margarita Grizzle, RN regarding message received (see message below).    Margarita Grizzle states during appointment yesterday (10/11) patient was to be prescribed HCTZ 12.5 mg, Potassium 10 MEQ, and Vitamin B 12. All prescriptions are to be sent to [facility], [phone]. RN acknowledged Laurie's statement.    Margarita Grizzle also reporting today patient is not eating well and not communicating well. RN informed [facility] T[facility] Monday through Friday, 8-11 am and 1:30-3 pm. RN also informed Margarita Grizzle, patient did not complete labs or chest xray. Margarita Grizzle verbalized in understanding.    Note forwarded to Purcell Nails, NP.    Lavone Orn routed conversation to [employer] Staff Pool29 minutes ago (1:04 PM)     Lilia Pro, Daniel29 minutes ago (1:04 PM)     DS     RETURN CALL: Voicemail - Detailed Message        SUBJECT:  General Message      MESSAGE: Received a call from pt's nurse.  Caller says pt was supposed to be prescribed blood pressure medication yesterday which he badly needs but pt did not receive this prescription.  Caller believes this may be a mistake by the provider.  Callback # [phone].            Note         Margarita Grizzle [phone]  Lilia Pro, Daniel30 minutes ago (1:02 PM)

## 2022-01-23 ENCOUNTER — Telehealth (HOSPITAL_BASED_OUTPATIENT_CLINIC_OR_DEPARTMENT_OTHER): Payer: Self-pay | Admitting: Unknown Physician Specialty

## 2022-01-23 NOTE — Telephone Encounter (Signed)
SOCIAL WORK FOLLOW-UP NOTE     DATE: 01/23/2022    Brief description: Coordination of Care; Caregiver Support      Patient description, presenting problem, patient / family goals:   Trenten Watchman is a 56 year old male receiving primary [facility] in the [facility] by Dr. Barrington Ellison, MD. Pt's sister who provided support and was involved in Pt's care coordination has been hospitalized. Pt has history of APS involvement regarding concerns of inadequate case management services at [facility]. PCP requesting SW follow up regarding caregiver support in place for Pt at home.     ContactSaralyn Pilar: [phone] (cell)  Case Manager  [facility]     Intervention / Outcome / Plan:   MSW contacted [facility] to inquire name/contact of case manager and [facility] does not have Pt in their system. MSW attempted to reach [place] using phone number listed in chart notes and call did not go through. MSW contacted [facility] and was provided CM cell phone number (listed above). Per CM, Pt has active in-home caregiver support and a conference call took place this morning with DDA and Pt's other sister, Melia, who has stepped in to provide caregiver support to Pt. Melia had questions about medication management as Pt has hx of not taking medication and process of systems that offer support to Pt.     Pt's [facility] CM shared that he has been working with Pt for 15 years and is the mental health case manager for Pt. Pt receives services through DDA (Developmental Disabilities Administration) [facility] who provide oversight to in-home caregiver services with [place] will be re-assessed for [facility] service hours and DDA is working with Ernestina Columbia to identify additional caregiver for Pt. In the past, Pt has worked with two consistent caregivers and the familiarity of them was helpful to Pt in engagement. No further SW involvement needed at this time and SW will update PCP.     MSW will remain available for continued  support for patient as needed.     _____________________________    Evans Lance, MSW, [facility]   Social Worker  Adult Medicine Clinic  [facility]  Phone: [phone]

## 2022-01-29 NOTE — Telephone Encounter (Signed)
Opened in error

## 2022-03-11 ENCOUNTER — Other Ambulatory Visit (HOSPITAL_BASED_OUTPATIENT_CLINIC_OR_DEPARTMENT_OTHER): Payer: Self-pay | Admitting: Internal Medicine

## 2022-03-11 ENCOUNTER — Other Ambulatory Visit (HOSPITAL_BASED_OUTPATIENT_CLINIC_OR_DEPARTMENT_OTHER): Payer: Self-pay | Admitting: Nurse Practitioner

## 2022-03-11 ENCOUNTER — Ambulatory Visit: Payer: No Typology Code available for payment source | Attending: Internal Medicine | Admitting: Internal Medicine

## 2022-03-11 VITALS — BP 146/57 | HR 98 | Temp 97.3°F | Wt 159.0 lb

## 2022-03-11 DIAGNOSIS — M436 Torticollis: Secondary | ICD-10-CM

## 2022-03-11 DIAGNOSIS — I10 Essential (primary) hypertension: Secondary | ICD-10-CM

## 2022-03-11 DIAGNOSIS — Z0000 Encounter for general adult medical examination without abnormal findings: Secondary | ICD-10-CM | POA: Insufficient documentation

## 2022-03-11 DIAGNOSIS — E119 Type 2 diabetes mellitus without complications: Secondary | ICD-10-CM

## 2022-03-11 DIAGNOSIS — F88 Other disorders of psychological development: Secondary | ICD-10-CM | POA: Insufficient documentation

## 2022-03-11 DIAGNOSIS — Z23 Encounter for immunization: Secondary | ICD-10-CM | POA: Insufficient documentation

## 2022-03-11 LAB — LIPID PANEL
Cholesterol/HDL Ratio: 2.6
HDL Cholesterol: 47 mg/dL (ref >39)
LDL Cholesterol, NIH Equation: 63 mg/dL (ref <130)
Non-HDL Cholesterol: 76 mg/dL (ref 0–159)
Total Cholesterol: 123 mg/dL (ref <200)
Triglyceride: 60 mg/dL (ref <150)

## 2022-03-11 LAB — CBC (HEMOGRAM)
Hematocrit: 40 % (ref 38.0–50.0)
Hemoglobin: 13.5 g/dL (ref 13.0–18.0)
MCH: 29.9 pg (ref 27.3–33.6)
MCHC: 33.6 g/dL (ref 32.2–36.5)
MCV: 89 fL (ref 81–98)
Platelet Count: 170 10*3/uL (ref 150–400)
RBC: 4.51 10*6/uL (ref 4.40–5.60)
RDW-CV: 13.5 % (ref 11.0–14.5)
WBC: 4.52 10*3/uL (ref 4.3–10.0)

## 2022-03-11 LAB — COMPREHENSIVE METABOLIC PANEL
ALT (GPT): 32 U/L (ref 10–48)
AST (GOT): 42 U/L — ABNORMAL HIGH (ref 9–38)
Albumin: 4.2 g/dL (ref 3.5–5.2)
Alkaline Phosphatase (Total): 94 U/L (ref 39–139)
Anion Gap: 7 (ref 4–12)
Bilirubin (Total): 1.3 mg/dL (ref 0.2–1.3)
Calcium: 9.6 mg/dL (ref 8.9–10.2)
Carbon Dioxide, Total: 30 meq/L (ref 22–32)
Chloride: 103 meq/L (ref 98–108)
Creatinine: 1.11 mg/dL (ref 0.51–1.18)
Glucose: 72 mg/dL (ref 62–125)
Potassium: 3.9 meq/L (ref 3.6–5.2)
Protein (Total): 6.9 g/dL (ref 6.0–8.2)
Sodium: 140 meq/L (ref 135–145)
Urea Nitrogen: 16 mg/dL (ref 8–21)
eGFR by CKD-EPI 2021: >60 mL/min/{1.73_m2} (ref >59)

## 2022-03-11 LAB — ALBUMIN/CREATININE RATIO, RANDOM URINE
Albumin (Micro), URN: 11.29 mg/dL
Albumin/Creatinine Ratio, URN: 78 mg/g{creat} — ABNORMAL HIGH (ref <30)
Creatinine/Unit, URN: 145 mg/dL

## 2022-03-11 LAB — C_REACTIVE PROTEIN: C_Reactive Protein: 1.2 mg/L (ref 0.0–10.0)

## 2022-03-11 LAB — VITAMIN B12 (COBALAMIN): Vitamin B12 (Cobalamin): 1035 pg/mL — ABNORMAL HIGH (ref 180–914)

## 2022-03-11 LAB — SED RATE: Erythrocyte Sedimentation Rate: 5 mm/h (ref 0–15)

## 2022-03-11 LAB — TSH WITH REFLEXIVE FREE T4: TSH with Reflexive Free T4: 1.919 u[IU]/mL (ref 0.400–5.000)

## 2022-03-11 MED ORDER — HYDROCHLOROTHIAZIDE 12.5 MG OR CAPS
12.5000 mg | ORAL_CAPSULE | Freq: Every day | ORAL | 3 refills | Status: DC
Start: 2022-03-11 — End: 2022-07-03

## 2022-03-11 MED ORDER — ZZCOVID-19 MRNA VACC (MODERNA) 50 MCG/0.5ML IM WRAPPER
INTRAMUSCULAR | Status: AC
Start: 2022-03-11 — End: 2022-03-11
  Filled 2022-03-11: qty 0.5

## 2022-03-11 MED ORDER — INFLUENZA VAC SUBUNIT QUAD 0.5 ML IM SUSY
PREFILLED_SYRINGE | INTRAMUSCULAR | Status: AC
Start: 2022-03-11 — End: 2022-03-11
  Filled 2022-03-11: qty 0.5

## 2022-03-11 MED ORDER — ZZCOVID-19 MRNA VACC (MODERNA) 50 MCG/0.5ML IM WRAPPER
50.0000 ug | Freq: Once | INTRAMUSCULAR | Status: AC
Start: 2022-03-11 — End: 2022-03-11
  Administered 2022-03-11: 50 ug via INTRAMUSCULAR

## 2022-03-11 MED ORDER — INFLUENZA VAC SUBUNIT QUAD 0.5 ML IM SUSY
0.5000 mL | PREFILLED_SYRINGE | Freq: Once | INTRAMUSCULAR | Status: AC
Start: 2022-03-11 — End: 2022-03-11
  Administered 2022-03-11: 0.5 mL via INTRAMUSCULAR

## 2022-03-11 NOTE — Patient Instructions (Addendum)
[place], walk at least twice per week.  Remember to take your medicines every single day.  This is important to keep yourself healthy.      Go to the lab and do some blood and urine tests today.      We are giving you some vaccinations for flu and COVID today.     We will see you around March.

## 2022-03-11 NOTE — Progress Notes (Signed)
Chief Complaint   Patient presents with    Follow-Up     Refill Request          Subjective:     Justin Zavala is a 56 year old male who presents on 03/11/2022.    I last saw patient 11/07/21. Recall he has h/o developmental delay, HTN, B12 deficiency, DM2, asthma, GERD, schizophrenia on IM palperidone and divlaproex. At that visit his sister had expressed some concern about unintentional weight loss of around 28 lbs over a year. I ordered UA, serum glucose, PSA, FOBT, TSH at that time. Because of some low diastolic BP, so halved his HCTZ at the time. The CMP and UA were unremarkable. Had some unexpected mild leukocytosis and thrombocytopenia of uncertain significance so was considering possible HIV testing. Because A1c was 5.8%, his metformin was discontinued 11/30/21.     Patient's sister- who had been accompanying him to several appointments with he, unfortunately suffered a large stroke and was not able to attend his visits after that.  He came with his older sister and [facility] case manager Justin Zavala to 01/16/22 visit. At that visit, reported only getting IM injections at Justin Zavala and not his depakote, previously prescribed at [facility]. They had expressed some concerns about stiff neck and poor posture related to minimal exercise and watching television all day. Justin Zavala ordered labs (CRP , ESR,) and imaging (C spine xray) and referred to PT. She refilled several medicines, and planned to recheck A1c after holding metformin for several months (due today).     Telephone call with visiting nurse indicated they had had some trouble picking up prescriptions, but also that patient had not been eating or communicating well.     Social work call on 01/23/22 found patient to be receiving in-home caregiver support, and found also that patient has had h/o not taking medications. He receives support from [employer] case Freight forwarder, receives services through [employer].     Lives alone in apartment across the courtyard from another sister,  Justin Zavala. Another sister is Justin Zavala lives in Justin Z[place].  Has caregiver Justin Zavala through medicaid comes 1- 4 pm. Helps with meals, and cleaning, nephew Justin Zavala comes and helps with personal hygiene. Justin Zavala comes and helps with grocery shopping and visits a couple times per week. Justin Zavala is DDA case Freight forwarder.     Goes to [employer] at least weekly, includes a Men's group during the mornings. Gets some exercise when walking with his case manager on a weekly basis to [employer].  Medications are provided in bubble packs from [facility], and I reviewed and confirmed/ updated our medication list today. These are then placed into pill boxes by caregiver, who helps with reminding patient to take medications. Per case manager, remembering to take medications has been a challenge when not actively reminded.        Objective:    Vitals: BP (!) 146/57 Comment: Pt denies HA or dizziness, pt confirms taking BP meds  Pulse 98   Temp 36.3 C (Temporal)   Wt 72.1 kg (159 lb)   SpO2 98%   BMI 30.04 kg/m   Physical Exam  GEN: pleasant, no acute distress.   HEENT: missing some dentition.   ABD: soft.     Labs today:  CRP 1.2  ESR 5  Hgb A1c PENDING  TSH 1.92  B12 PENDING  UAlb/Cr PENDING    Lab Results   Component Value Date    SODIUM 140 03/11/2022    POTASSIUM  3.9 03/11/2022    CL 103 03/11/2022    CO2 30 03/11/2022    BUN 16 03/11/2022    CREATININE 1.11 03/11/2022    GLUCOSE 72 03/11/2022    CA 9.6 03/11/2022    AST 42 (H) 03/11/2022    ALT 32 03/11/2022    ALK 94 03/11/2022    PROTEIN 6.9 03/11/2022    ALBUMIN 4.2 03/11/2022     Lab Results   Component Value Date    CHOLESTEROL 123 03/11/2022    LDL 63 03/11/2022    HDL 47 03/11/2022    TRIGLYCERIDE 60 03/11/2022    NONHDL 76 03/11/2022     Lab Results   Component Value Date    WBC 4.52 03/11/2022    RBC 4.51 03/11/2022    HEMOGLOBIN 13.5 03/11/2022    HEMATOCRIT 40 03/11/2022    MCV 89 03/11/2022    MCH 29.9 03/11/2022    MCHC 33.6 03/11/2022    PLATELET 170  03/11/2022    RDWCV 13.5 03/11/2022          Assessment and Plan:      Justin Zavala was seen today for follow-up  and refill request.    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 Generally seems well-supported but with potential areas vulnerable to gaps in care (e.g. weekends, when nephew often comes to help; middle sister who lives very close by with now new health issues of her own; oldest sister who had recent stroke).     Essential hypertension  Comments:  Diastolic BP is low today. Recall that had decreased HCTZ and stopped ACEi because of concerns about BP, systolic a bit above goal. Continue to monitor  Orders:  -     hydroCHLOROthiazide 12.5 MG capsule; Take 1 capsule (12.5 mg) by mouth daily.  Dispense: 30 capsule; Refill: 3  -     Comprehensive Metabolic Panel; Future    Primary hypertension  Comments:  As above.    Type 2 diabetes mellitus without complication, without long-term current use of insulin  Comments:  A1c was most recently in reasonable range. Recheck today, as well as electrolytes/ renal function  Orders:  -     Comprehensive Metabolic Panel; Future  -     Hemoglobin A1C, HPLC; Future  -     Lipid Panel; Future  -     Albumin/Creatinine Ratio, Random Urine; Future    Health care maintenance  Comments:  Flu vaccine and 2023 COVID booster today.  Orders:  -     influenza quadrivalent PF MDCK vaccine (Flucelvax) syringe 0.5 mL  -     COVID-19 Moderna mRNA vaccine (Spikevax) injection 50 mcg       I spent a total of 71 minutes for the patient's care on the date of the service.

## 2022-03-12 LAB — HEMOGLOBIN A1C, HPLC: Hemoglobin A1C: 5.6 % (ref 4.0–6.0)

## 2022-03-13 ENCOUNTER — Telehealth (HOSPITAL_BASED_OUTPATIENT_CLINIC_OR_DEPARTMENT_OTHER): Payer: Self-pay

## 2022-03-13 NOTE — Telephone Encounter (Signed)
RETURN CALL: Voicemail - Detailed Message      SUBJECT:  Cancellation/Reschedule Request     APPOINTMENT DATE TO CANCEL:   03/14/22    REASON FOR CANCELLATION:   Cannot make it    RESCHEDULE PREFERRED DATE/TIME:   NA    ADDITIONAL INFORMATION:   No openings prior to referral expiring on 04/07/22. PER SM, [facility] unable to appoint. Please call back.

## 2022-03-14 ENCOUNTER — Encounter (HOSPITAL_BASED_OUTPATIENT_CLINIC_OR_DEPARTMENT_OTHER): Payer: No Typology Code available for payment source | Admitting: Physical Therapist

## 2022-03-14 NOTE — Progress Notes (Deleted)
PHYSICAL THERAPY INITIAL PLAN OF CARE          VISITS FROM [facility]: Visit count could not be calculated. Make sure you are using a visit which is associated with an episode.     Labor and Industry? _0  No _1  Yes, if yes POC due 04/13/22           L&I claim number: [id number]   Medicare A/B? _2  No _3  Yes, if yes POC due 06/12/22   Progress note due: 04/13/22   Anticipated date of discharge: ***     INTERPRETER  STATUS: {.:999023::"not needed"}     Referring Provider:  Albertha Ghee    _4   Internal or  _5   External Provider  Referral 01/16/22: 56 year old male. Reason for Referral: deconditioned related to minimal activity, watches a lot of TV shile sitting in a kitchen straight back chair.      Diagnosis: ***  Diagnosis: No diagnosis found.   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 ***    What improves symptom(s)? ***     HPI per chart review: patient has history of developmental delay, sisters help him as well as caregiver 1-4pm and nephew who helps with person hygiene        OBJECTIVE MEASURES / IMPAIRMENTS / ACTIVITY LIMITATIONS:   Objective     *Informed consent obtained prior to each examination procedure and  treatment*    Pain: ***/10    Posture/Observation:  Cervical: ***  Scapular Alignment: ***   Humerus: ***  Thoracic: ***  Lumbar: ***    Gait: ***    Cervical ROM:   Flexion: ***   Extension: ***   Side-bending: (R) ***  (L) ***   Rotation: (R) ***  (L) ***   *passive shoulder girdle elevation test:  ***    Thoracic AROM:  Flexion: ***  Extension: ***  Side bending: (R) *** (L) ***  Rotation: (R) *** (L) ***    Shoulder ROM:  Flexion: (R) *** (L) ***  Abduction: (R) *** (L) ***    Neuro Exam:   Motor Score:   Shoulder Elevation (C4): ***  Elbow Flexion (C5): ***  Wrist Extension (C6): ***  Elbow Extension (C7): ***  Grip Strength (C8): ***  Finger Abduction (T1): ***    Key muscles tested from C4-T1 are within normal limits ***  Key muscles tested from  *** are within normal limits except ***    Light touch Sensation:   C4 (AC joint): ***  C5 (lateral upper arm): ***  C6 (thumb): ***  C7 (middle finger): ***  C8 (medial forearm): ***  T1 (medial upper arm): ***    Sensation to light touch is intact  Sensation to light touch is intact except ***    Deep Tendon Reflexes:  Biceps (C5), Brachioradialis (C6), Triceps (C7) Deep Tendon Reflexes are within normal limits ***     Pathological Reflexes: Hoffman's and wrist clonus reflexes are ***    Cervical Special Tests:  Spurling's: ***  Cervical compression: ***  Cervical Distraction: ***  Upper Limb Tension Test 1/A ***    Shoulder Clearing Exam:  {Shoulder Clearing Exam:500251822}    Cervical Special Tests:    {Cervical Special Tests:500251068}      Thoracic Special Tests:  {Thoracic Special Tests:500251073}    Periscapular Strength:  Serratus Anterior: ***  Middle Trapezius: ***  Lower Trapezius: ***     Joint Mobility:  ***     Palpation: ***    Fall Risk Screen:  ***     TREATMENT & EDUCATION      Intervention 1:   {PT INTERVENTIONS/SKILLED SERVICES:500251371}         Intervention 2:   {PT INTERVENTIONS/SKILLED SERVICES:500251371}        Intervention 3:   {PT  INTERVENTIONS/SKILLED SERVICES:500251371}        HOME EXERCISE PROGRAM  ***    {PT EDUCATION ([place]/ETC):105852}    Time in:   ***  Time out:   ***  Total Timed Code Minutes:  ***            ASSESSMENT   Assessment   Justin Zavala is a 56 year old male presenting to physical therapy with a primary complaint of ***.  Signs, symptoms and objective tests are consistent with ***. The patient's main impairments include ***, affecting their ability to ***. Patient will benefit from skilled physical therapy in order to address the above impairments and meet their goals.  Patient's primary goal is to return to ***.    PHYSICAL THERAPY GOALS     Goals and Current Level of Function/ Established/Updated on 03/14/2022  Long Term Goals (Functional Goals) Anticipated Discharge Date 06/12/22  Patient to report ***        Current Level of Function/ Participation Restrictions: ***  Patient to report  ability to perform lifting tasks to shoulder height without neck pain by discharge        Current Level of Function/ Participation Restrictions: ***    Short Term Goals to address Activity Limitations.  Short Term Goals to be Met on 04/13/22  1. Patient to be able to turn head 60 Degree(s) for safety while driving/ crossing the street.  ***   Patient to demonstrate 5 second DNF hold for cervical stability during activities of daily living. ***  Patient to be indep with HEP 4 days a week as needed for self-management of symptoms         [place] (Frequency/Duration/Other Follow up): Patient will be seen for {Patient LPFX:902409735}  until plan of care reviewed.  To improve impairments and reach functional goals the following interventions will be performed: {interventions:500251383}  Patient assisted in establishing goals and agrees with plan  Patient's learning needs, abilities, preferences and readiness per Initial POC were considered in this session. Learning verified by teach back.  Pain will be addressed at the next  plan of care review, and intermittently during daily visits as deemed appropriate  by the provider.  The focus of daily visits will be on function.    PLAN FOR NEXT VISIT(S): ***     DOCUMENTATION / INSURANCE  REQUIREMENTS     EVAL CODE:  Evaluation Code: {EVAL CODE:102991::"97161","97162","97163"} for *** minutes  Clinical Presentation for Selection of Evaluation Code: {CLINICAL PRESENTATION:102991::"Stable","Evolving","Unstable"}  Clinical Decision Making Complexity for Selection of Evaluation Code: {CLINICAL DECISION MAKING:102991::"Low","Moderate","High"}  Eval Code Reasoning & Potential Barriers: {PTbarriers:120712}  Eval complexity selection based on the Objective Findings, Co-morbidity Assessment, Psychosocial Factors and Potential Barriers.    Prognosis and Factors that will impact course of care:  Prognosis for meeting outline goals: ***  The following cultural factors were taken into consideration: none    Outcomes Score: ***  _0   See scanned document at Evaluation _1   Not completed by patient    FALL RISK:   _2   low _3   mod _4   high fall risk based on screening       Education Assessment:  Primary Learner: Patient  Challenges Impacting this Teaching: None  Desire and Motivation to Learn: Engaging with education, ask questions  Preferred Learning Style: demonstration and written  Post Education Response:  Demonstrates tasks independently  Topics Taught: role of physical therapy, education on diagnosis and prognosis, purpose of interventions, and initial HEP instructions           Orinda Kenner, PT, DPT  [facility] [place] [facility]  [facility]  Phone: [phone]   FAX: [phone]

## 2022-03-21 NOTE — Result Encounter Note (Signed)
Elevated albumin to Cr ratio. With static cognitive delay, will defer to discuss with patient until next in person visit.

## 2022-04-02 ENCOUNTER — Encounter (HOSPITAL_BASED_OUTPATIENT_CLINIC_OR_DEPARTMENT_OTHER): Payer: Self-pay

## 2022-05-06 ENCOUNTER — Ambulatory Visit: Payer: No Typology Code available for payment source | Attending: Internal Medicine | Admitting: Internal Medicine

## 2022-05-06 VITALS — BP 176/51 | HR 96 | Temp 97.9°F | Wt 160.0 lb

## 2022-05-06 DIAGNOSIS — L603 Nail dystrophy: Secondary | ICD-10-CM | POA: Insufficient documentation

## 2022-05-06 DIAGNOSIS — R809 Proteinuria, unspecified: Secondary | ICD-10-CM | POA: Insufficient documentation

## 2022-05-06 DIAGNOSIS — M25612 Stiffness of left shoulder, not elsewhere classified: Secondary | ICD-10-CM | POA: Insufficient documentation

## 2022-05-06 DIAGNOSIS — L219 Seborrheic dermatitis, unspecified: Secondary | ICD-10-CM | POA: Insufficient documentation

## 2022-05-06 DIAGNOSIS — E119 Type 2 diabetes mellitus without complications: Secondary | ICD-10-CM | POA: Insufficient documentation

## 2022-05-06 DIAGNOSIS — R0989 Other specified symptoms and signs involving the circulatory and respiratory systems: Secondary | ICD-10-CM | POA: Insufficient documentation

## 2022-05-06 DIAGNOSIS — M436 Torticollis: Secondary | ICD-10-CM | POA: Insufficient documentation

## 2022-05-06 DIAGNOSIS — H547 Unspecified visual loss: Secondary | ICD-10-CM | POA: Insufficient documentation

## 2022-05-06 DIAGNOSIS — I10 Essential (primary) hypertension: Secondary | ICD-10-CM | POA: Insufficient documentation

## 2022-05-06 DIAGNOSIS — D1724 Benign lipomatous neoplasm of skin and subcutaneous tissue of left leg: Secondary | ICD-10-CM | POA: Insufficient documentation

## 2022-05-06 DIAGNOSIS — F209 Schizophrenia, unspecified: Secondary | ICD-10-CM | POA: Insufficient documentation

## 2022-05-06 DIAGNOSIS — M25611 Stiffness of right shoulder, not elsewhere classified: Secondary | ICD-10-CM | POA: Insufficient documentation

## 2022-05-06 NOTE — Patient Instructions (Addendum)
Go to the xray department for xrays of the neck and shoulder  Apply the antibiotic ointment to the toenail a couple times per day for the next 3-4 days.   We will schedule an appointment with our pharmacists in the next two weeks.  Then bring in all your packs of medicines, anything with labels, any pill boxes so we can review with you.   The scalp condition can be treated with a shampoo called selenium sulfide.  This can be done in the shower a couple of days per week.   We are ordering an ECHOCARDIOGRAM, an ultrasound picture of the heart.  They will contact you to schedule this.

## 2022-05-06 NOTE — Progress Notes (Signed)
Chief Complaint   Patient presents with    Nail Problem     Discoloration    Fall     Last night 1/28, hit head     Musculoskeletal Problem     Left leg "fatty lumps"    Referral     Physical therapy    Forms Completion     Handicap Plaqued           Subjective:     Justin Zavala is a 57 year old male who presents on 05/06/2022.    Justin Zavala middle sister (from [place]) comes to visit today.  Justin Zavala, care giver, comes to this visit.  He is there 8 hours per day Monday/ Friday. Lives in own apartment space, but cleaner and safer than before.     Recall that other sister Justin Zavala had been accompanying him to his appointments but suffered an unexpected health emergency with a challenging road to recovery herself.      Patient has static encephalopathy/ developmental delay; schizophrenia on IM palperidone and divalproex; HTN; B12 deficiency; type 2 DM; asthma; GERD. I had noted several other issues including mild leukocytosis and thrombocytopenia and had been considering some early workup including HIV testing. Because his A1c had been 5.8%, I had discontinued his metformin in 11/30/21 visit. Because his diastolic blood pressures had been low, I had halved his HCTZ on 11/07/21.     At some point during the late summer in the process of reviewing and cleaning up his medications, his lisinopril (had been 40 mg) had also been stopped, as well as his KCl 20 mEq daily.     He had had a case manager come from [employer] to one of his fall appointments, and receives services through [facility] through [facility]. His case manager through [facility] has been [employer].     Prior investigation had indicated that he gets bubble packs from [facility], and I had tried to update our medication list as we had been going on.  However, sister Justin Zavala had contacted our pharmacists to switch pick up to [facility] instead (10/17/21 note).  However it appears that his meds last filled 01/16/22 were sent again to  [facility].     These would then be placed into pill boxes by another individual (what may be visiting nurse through [facility], but unclear).  However, his new caregiver [name] and sister Justin Zavala indicate today that although patient reports everyday adherence to his medications, that they have found multiple unopened pill packets dating back many months or years, and that he misses doses perhaps around 1-2 times per week.     Other issues to discuss:   Restricted motion of the upper extremities-   Caregiver has noted that he has some difficulty with removing shirts and other clothing, and often seems to be holding his RUE in a kind of cradle position.  Has also some difficulty with moving his neck.     Vision problems-  Caregiver has noted that he often sits very close up to the edge of the television. When asked, patient has lost / broke his glasses and has not had replacements.  He has an outside optometrist he follows with.     Dystrophic overgrown toenails.   Scaling over beard/ scalp.   Left inner thigh/ upper lower leg lumps.   Need for disabled parking.          Objective:    Vitals: BP (!) 176/51 Comment: pt  denies HA, dizziness, or chest pain  Pulse 96   Temp 36.6 C (Temporal)   Wt 72.6 kg (160 lb)   SpO2 100%   BMI 30.23 kg/m   Physical Exam  Repeat manual BP check similar to above.   HEENT: anicteric sclerae.   NECK: has some difficulty with rotation to either direction beyond around 35-45 degrees from center.  Also difficulty with flexion or extension, such as looking down at his feet.   CV: Regular S1 S2 with soft early systolic murmur 3-2/ 6 best at RUSB without radiation.  Carotid pulses equal.   MSK: Left lower thigh medial has several soft lipomatous consistency masses of various sizes, none fixed, largest medial to the knee.   DERM: seborrheic scaly scalp over beard as well.        Assessment and Plan:      Dupree was seen today for nail problem, fall, musculoskeletal problem, referral  and forms completion.    Much of our challenges arise from uncertainty about whether our medication list matches what he is supposed to be taking (e.g. reports lisinopril, while this was removed from our list)), and uncertainty about some medications having been switched to [facility] but others still going to [employer].  Additionally, there is the additional step when these go from pre-packed and then get placed into pill boxes. Finally, there is incomplete adherence apparently even when these are packed correctly.     Some of this relates to tiered mental health care being delivered by [facility] rather than by [facility].  Some of this also relates to his sister Justin Zavala who had been in process of transferring/ changing some of this, herself became ill with a health issue in the middle of this. Some also relates to turnover among his care givers.     In order to try to clarify what he is taking before making changes, I have asked him to return in 1-2 weeks to see our clinical pharmacists.  I have asked them to bring ALL his pill packs, his pill boxes, any unused packets, etc top that appointment.     Then I will see him a couple of weeks after that to again try to coordinate what is actually happening with his medications.     Essential hypertension  Comments:  Hypertensive in systolic pressure, but low diastolic. With a murmur, will order non-urgent TTE to assess heart function but also for valvular lesions. With his diabetes, and his microalbuminuria, we would like to see about putting him back on ACEi or ARB, and if needed, would add one of those back, and if needed for "room" with BP, would consider decreasing or stopping the small dose of HCTZ he is still taking.   Orders:  -     Referral to Pharmacy; Future  -     TransTHORACIC echo (TTE) complete; Future    Widened pulse pressure  Comments:  As above.  Orders:  -     TransTHORACIC echo (TTE) complete; Future    Type 2 diabetes mellitus without  complication, without long-term current use of insulin  Comments:  Per my notes we had stopped metformin, but patient reports that he is taking this. Will ask our pharmacists to review this as well.    Albuminuria  Comments:  As above, apparently his lisinopril has been stopped and his HCTZ has been decreased. However, given elevated UAlb/Cr, would favor resuming ACEi if possible.     Schizophrenia, unspecified type ([place])  Comments:  On monthly IM antipsychotic medication injections. Unclear if some of his issues with neck, shoulder mobility might be related to side effect? Tiered to [employer].     Shoulder joint stiffness, bilateral  Comments:  Unclear etiology, but has some restriction of the mobility of the c-spine and the L>R shoulders. Will start with plain radiographs of both, but possible u/s of his rotator cuff muscles and tendons to assess for rotator cuff tendinopathy or frozen shoulder.  Will consider referral to PT, but would like to better understand what might be going on first.   Orders:  -     XR SHOULDER MIN 2 VIEWS BILAT; Future    Neck stiffness  Comments:  As above.  Orders:  -     XR SHOULDER MIN 2 VIEWS BILAT; Future    Vision problems  Comments:  Pt's sister to bring him to his optometrist to get renewed glasses for both near and far vision issues.    Dystrophic nail  Comments:  I trimmed his long dystrophic nails. Because I nicked his third toe nail bed on the right with some slight blood, gave him triple abx ointment and advised to apply twice per day for the next 4-5 days.     Lipoma of left lower extremity  Comments:  Feels like lipomas. Monitor for now. Given some questions in prior visits about unexpected leukopenia, will do more complete LAN exam next visit.    Seborrhea  Comments:  Seborrheic keratosis with scaling and dandruff.  Reminded patient he has Rx for selenium, reviewed application with them.    I deferred his disabled parking request to next visit with me.      I  spent a total of 143 minutes for the patient's care on the date of the service.

## 2022-05-07 ENCOUNTER — Telehealth (HOSPITAL_BASED_OUTPATIENT_CLINIC_OR_DEPARTMENT_OTHER): Payer: Self-pay | Admitting: Internal Medicine

## 2022-05-07 NOTE — Telephone Encounter (Signed)
RETURN CALL: Detailed message on voicemail only    SUBJECT:  General Message     MESSAGE: The caller stated she was returning a call that was received at about 1:25 PM today about making a pharmacist appointment.    The patient was scheduled yesterday for a Pharmacist appointment on 2/14. The caller said that she would like a call back to confirm that this already scheduled appointment is fine and there is nothing else to do.    The caller said that she is hard to reach to please leave a detailed VM to answer this question.    Thanks.

## 2022-05-10 ENCOUNTER — Ambulatory Visit
Admission: RE | Admit: 2022-05-10 | Discharge: 2022-05-10 | Disposition: A | Discharge status: Home / Self Care | Payer: No Typology Code available for payment source | Attending: Diagnostic Radiology | Admitting: Diagnostic Radiology

## 2022-05-10 DIAGNOSIS — M25612 Stiffness of left shoulder, not elsewhere classified: Secondary | ICD-10-CM | POA: Insufficient documentation

## 2022-05-10 DIAGNOSIS — M25611 Stiffness of right shoulder, not elsewhere classified: Secondary | ICD-10-CM | POA: Insufficient documentation

## 2022-05-10 DIAGNOSIS — M436 Torticollis: Secondary | ICD-10-CM | POA: Insufficient documentation

## 2022-05-10 DIAGNOSIS — M19012 Primary osteoarthritis, left shoulder: Secondary | ICD-10-CM | POA: Insufficient documentation

## 2022-05-10 DIAGNOSIS — M19011 Primary osteoarthritis, right shoulder: Secondary | ICD-10-CM | POA: Insufficient documentation

## 2022-05-21 NOTE — Progress Notes (Deleted)
Visit type: {;:106517}  Interpreter present: {;:106518:}  No specialty comments available.    ASSESSMENT/PLAN       Follow up:   ***    SUBJECTIVE   Croix Klaver is a 57 year old male here today to see pharmacist for {;:106520} for No chief complaint on file.    PMH T2DM, asthma, schizophrenia, GERD, HTN,   Bubblepacks through [facility]     Rx  - HCTZ 12.5 mg/day , K Cl 20 mEq/day  - Pravastatin 40 mg QHS   - B12 1000 mcg/day  - Claritine 10 mg PRN, Pepcid 20 mg BID PRN   - Paliperidol Q30 days    Interval history: Last seen by PCP 05/06/22, unclear what medications pt was taking vs reporting, referred to pharamacy for med management.     Due for Tdap, RSV, PCV20     Review of patient's allergies indicates:  No Known Allergies      OBJECTIVE     BP Readings from Last 3 Encounters:   05/06/22 (!) 176/51   03/11/22 (!) 146/57   01/16/22 (!) 157/55     Pulse Readings from Last 3 Encounters:   05/06/22 96   03/11/22 98   01/16/22 87     Wt Readings from Last 3 Encounters:   05/06/22 72.6 kg (160 lb)   03/11/22 72.1 kg (159 lb)   11/30/21 72.7 kg (160 lb 3.2 oz)       Recent Labs     03/11/22  1644 11/07/21  1456   SODIUM 140 138   POTASSIUM 3.9 4.1   CL 103 102   CO2 30 30   IONGAP 7 6   GLUCOSE 72 115   BUN 16 11   CREATININE 1.11 1.00   GFR >60 >60       Lab Results   Component Value Date    A1C 5.6 03/11/2022    A1C 5.8 11/07/2021    A1C 5.7 03/08/2019       The ASCVD Risk score (Arnett DK, et al., 2019) failed to calculate for the following reasons:    The valid total cholesterol range is 130 to 320 mg/dL    The smoking status is invalid    Lab Results   Component Value Date    ALBCREATRATU 78 (H) 03/11/2022    ALBCREATRATU 14 11/07/2021    ALBCREATRATU 6 03/08/2019       Foot Exam:***  Immunizations:***    Medications at End of Visit:  Current Outpatient Medications   Medication Sig Dispense Refill    albuterol HFA 108 (90 Base) MCG/ACT inhaler INHALE 2 PUFFS BY MOUTH EVERY SIX HOURS AS NEEDED TO CONTROL  SHORTNESS OF BREATH 6.7 g 6    cyanocobalamin (Vitamin B-12) 1000 MCG tablet TAKE 1 TABLET BY MOUTH DAILY 28 tablet 6    famotidine 20 MG tablet Take 1 tablet (20 mg) by mouth 2 times a day as needed. 60 tablet 6    hydroCHLOROthiazide 12.5 MG capsule Take 1 capsule (12.5 mg) by mouth daily. 30 capsule 3    loratadine 10 MG tablet Take 1 tablet (10 mg) by mouth daily as needed for allergies. 30 tablet 6    paliperidone palmitate 234 MG/1.5ML suspension prefilled syringe Inject 1.5 mL (234 mg) intramuscularly every 30 days. 1.5 mL 6    potassium chloride ER 10 MEQ ER tablet Take 2 tablets (20 mEq) by mouth daily. 56 tablet 6    pravastatin 40 MG tablet Take  1 tablet (40 mg) by mouth every evening. 28 tablet 6    selenium sulfide 2.5 % lotion Apply topically 4 times a week. Apply to scalp and beard area. 120 mL 11     No current facility-administered medications for this visit.       I spent a total of *** minutes for the patient's care on the date of the service.  {Vanishing Tip  When performed by provider on date of visit, these count toward total time  Chart review   History taking  Physical exam  Counseling, educating patient/family/caregiver  Orders   Referrals and communication with other providers (not separately reported)  Documentation  Care coordination (not separately reported)  Independent interpretation of results (not separately reported) and communicating results to patient/family/caregiver  :999}      Acquanetta Chain, PharmD

## 2022-05-22 ENCOUNTER — Ambulatory Visit (HOSPITAL_BASED_OUTPATIENT_CLINIC_OR_DEPARTMENT_OTHER): Payer: No Typology Code available for payment source

## 2022-06-03 ENCOUNTER — Telehealth (HOSPITAL_BASED_OUTPATIENT_CLINIC_OR_DEPARTMENT_OTHER): Payer: Self-pay | Admitting: Internal Medicine

## 2022-06-03 ENCOUNTER — Encounter (HOSPITAL_BASED_OUTPATIENT_CLINIC_OR_DEPARTMENT_OTHER): Payer: Self-pay | Admitting: Internal Medicine

## 2022-06-03 ENCOUNTER — Ambulatory Visit: Payer: No Typology Code available for payment source | Attending: Internal Medicine | Admitting: Internal Medicine

## 2022-06-03 VITALS — BP 147/57 | HR 86 | Temp 99.0°F | Resp 16 | Wt 165.0 lb

## 2022-06-03 DIAGNOSIS — F209 Schizophrenia, unspecified: Secondary | ICD-10-CM | POA: Insufficient documentation

## 2022-06-03 DIAGNOSIS — R1310 Dysphagia, unspecified: Secondary | ICD-10-CM | POA: Insufficient documentation

## 2022-06-03 DIAGNOSIS — R197 Diarrhea, unspecified: Secondary | ICD-10-CM | POA: Insufficient documentation

## 2022-06-03 DIAGNOSIS — E119 Type 2 diabetes mellitus without complications: Secondary | ICD-10-CM | POA: Insufficient documentation

## 2022-06-03 DIAGNOSIS — M19011 Primary osteoarthritis, right shoulder: Secondary | ICD-10-CM | POA: Insufficient documentation

## 2022-06-03 DIAGNOSIS — M452 Ankylosing spondylitis of cervical region: Secondary | ICD-10-CM | POA: Insufficient documentation

## 2022-06-03 DIAGNOSIS — G4733 Obstructive sleep apnea (adult) (pediatric): Secondary | ICD-10-CM | POA: Insufficient documentation

## 2022-06-03 DIAGNOSIS — R195 Other fecal abnormalities: Secondary | ICD-10-CM | POA: Insufficient documentation

## 2022-06-03 DIAGNOSIS — M19012 Primary osteoarthritis, left shoulder: Secondary | ICD-10-CM | POA: Insufficient documentation

## 2022-06-03 NOTE — Telephone Encounter (Signed)
RETURN CALL: Voicemail - Detailed Message      SUBJECT:  General Message     MESSAGE: Patient's sister, [name], wants to know if she can join the appointment today by phone because she is sick and unable to come in person for the appointment.

## 2022-06-03 NOTE — Patient Instructions (Addendum)
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  This is an unusual kind of arthritis that we see in the spine sometimes, but this can also cause other problems in the body.      We are making a referral to our [facility] doctors.  They specialize in arthritis, and we will ask them to review your xrays and your condition to see if they think that you have ankylosing spondylitis.  If so, we will also ask them to recommend a treatment plan.     For now, we will watch your swallowing issues, because we are not sure if they are connected to this arthritis.     For your sleep, try putting some extra pillows under the head of your bed or a wedge pillow.  Or you can sleep in an arm chair.  Sleeping upright can help your breathing while sleeping.

## 2022-06-03 NOTE — Progress Notes (Signed)
{  Vanishing Tip  [phone] E&M codes are now billed on either MDM or total time. You can document only what is medically necessary. :999}   Chief Complaint   Patient presents with    Swallowing Problem     A a few months now  Sometimes on food and sometimes his medications    Sleep Apnea    Behavioral Problem     Behavioral changes          Subjective: {VT  History items are no longer counted. You can document only relevant history and ROS items  Problem List  Medical Hx  Surgical Hx  Family Hx  Substance & Sexual Hx  Social Documentation  Obstetric Hx   Birth Hx :999}    Ivin Konecny is a 57 year old male who presents on 06/03/2022.    I last saw patient on 05/06/22, together with his middle sister [name] from [place], as well as with his care giver [name]. At the time, he had some difficulty with his upper extremity motion and seemed to be having difficulty with some tasks like removing his clothing, moving his RUE and his neck.     I asked him to return to see our pharmacists and bring all his pill packs, his pill boxes, etc, and had planned to see him a couple of weeks after that.  I also had ordered a non urgent TTE to assess his heart function because of widened pulse pressure- my thoughts had been to decrease his HCTZ if needed to create more blood pressure room to add back ACEi or ARB given his microalbuminuria. I also ordered xray and was considering possible u/s or PT referral.     The xrays of the neck showed unexpectedly changed consistent with ankylosing spondylitis (see report below).     Sister Phyliss was not available because ill at home.      Reports having some difficulty with swallowing of pills for the past few months.  No pain with swallowing, no heartburn, no difficulties with liquid swallowing.      Has had occasional episodes of fecal incontinence, usually diarrhea, would occur for a couple of days and then would stop on its own.     His case manager notes that he has had daytime  sleepiness for years       Objective: {VT  Vitals Flowsheet  Labs  Imaging :999}   Vitals: BP (!) 147/57 Comment: denies HA, denies dizziness  Pulse 86   Temp 37.2 C (Temporal)   Resp 16   Wt 74.8 kg (165 lb)   SpO2 97% Comment: room air  BMI 31.18 kg/m   Physical Exam  ***    Shoulder xrays:  R side moderate AC and mild GH joint OA.   L side mild AC and mild GH joint OA.     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 The two unfused segments left are at C3-4 and C4-5 which have developed large bulky nonbridging anterior osteophytoisis with preserved disc heights and facets.        Assessment and Plan: {VT  Meds  HM  Care Gaps :999}   {VT  LOS Calculator  Job aid  AMA's MDM grid :999}  {Assessment & [id number]

## 2022-06-05 ENCOUNTER — Other Ambulatory Visit (HOSPITAL_BASED_OUTPATIENT_CLINIC_OR_DEPARTMENT_OTHER): Payer: Self-pay | Admitting: Internal Medicine

## 2022-06-05 ENCOUNTER — Ambulatory Visit (HOSPITAL_BASED_OUTPATIENT_CLINIC_OR_DEPARTMENT_OTHER): Payer: No Typology Code available for payment source

## 2022-06-05 DIAGNOSIS — J309 Allergic rhinitis, unspecified: Secondary | ICD-10-CM

## 2022-06-05 DIAGNOSIS — E119 Type 2 diabetes mellitus without complications: Secondary | ICD-10-CM

## 2022-06-05 DIAGNOSIS — J45909 Unspecified asthma, uncomplicated: Secondary | ICD-10-CM

## 2022-06-05 DIAGNOSIS — I10 Essential (primary) hypertension: Secondary | ICD-10-CM

## 2022-06-06 ENCOUNTER — Encounter (HOSPITAL_BASED_OUTPATIENT_CLINIC_OR_DEPARTMENT_OTHER): Payer: Self-pay

## 2022-06-06 ENCOUNTER — Encounter (INDEPENDENT_AMBULATORY_CARE_PROVIDER_SITE_OTHER): Payer: Self-pay

## 2022-06-06 MED ORDER — POTASSIUM CHLORIDE ER 10 MEQ OR TBCR
20.0000 meq | EXTENDED_RELEASE_TABLET | Freq: Every day | ORAL | 5 refills | Status: DC
Start: 2022-06-06 — End: 2022-07-26

## 2022-06-06 MED ORDER — LORATADINE 10 MG OR TABS
10.0000 mg | ORAL_TABLET | Freq: Every day | ORAL | 5 refills | Status: DC | PRN
Start: 2022-06-06 — End: 2022-07-26

## 2022-06-06 MED ORDER — ALBUTEROL SULFATE HFA 108 (90 BASE) MCG/ACT IN AERS
INHALATION_SPRAY | RESPIRATORY_TRACT | 5 refills | Status: DC
Start: 2022-06-06 — End: 2022-07-26

## 2022-06-06 MED ORDER — PRAVASTATIN SODIUM 40 MG OR TABS
40.0000 mg | ORAL_TABLET | Freq: Every evening | ORAL | 5 refills | Status: DC
Start: 2022-06-06 — End: 2022-07-26

## 2022-06-12 ENCOUNTER — Ambulatory Visit (HOSPITAL_BASED_OUTPATIENT_CLINIC_OR_DEPARTMENT_OTHER): Payer: No Typology Code available for payment source | Admitting: Internal Medicine

## 2022-06-17 ENCOUNTER — Ambulatory Visit (HOSPITAL_BASED_OUTPATIENT_CLINIC_OR_DEPARTMENT_OTHER): Payer: No Typology Code available for payment source

## 2022-06-17 ENCOUNTER — Encounter (HOSPITAL_BASED_OUTPATIENT_CLINIC_OR_DEPARTMENT_OTHER): Payer: Self-pay

## 2022-07-02 ENCOUNTER — Other Ambulatory Visit (HOSPITAL_BASED_OUTPATIENT_CLINIC_OR_DEPARTMENT_OTHER): Payer: Self-pay | Admitting: Internal Medicine

## 2022-07-02 DIAGNOSIS — I10 Essential (primary) hypertension: Secondary | ICD-10-CM

## 2022-07-02 DIAGNOSIS — E538 Deficiency of other specified B group vitamins: Secondary | ICD-10-CM

## 2022-07-04 MED ORDER — HYDROCHLOROTHIAZIDE 12.5 MG OR CAPS
12.5000 mg | ORAL_CAPSULE | Freq: Every day | ORAL | 2 refills | Status: DC
Start: 2022-07-04 — End: 2022-07-26

## 2022-07-04 MED ORDER — CYANOCOBALAMIN 1000 MCG OR TABS
ORAL_TABLET | ORAL | 2 refills | Status: DC
Start: 2022-07-04 — End: 2022-07-26

## 2022-07-18 ENCOUNTER — Encounter (HOSPITAL_BASED_OUTPATIENT_CLINIC_OR_DEPARTMENT_OTHER): Payer: Self-pay | Admitting: Internal Medicine

## 2022-07-18 NOTE — Progress Notes (Signed)
Asked to fill out electronic death certificate information about patient, was found deceased two days ago in his apartment. Further details are not fully available- contacted sister Bea Laura who confirmed that patient had been found dead but expressed her displeasure of his care with our clinic and did not provide additional information of the circumstances of his death.  I offered my condolences to the patient's sister yesterday by telephone.     Per [facility] information, patient's case has been referred to Medical Examiner's office. Without additional available information, suspect that proximate cause of death was most likely acute hypoxic respiratory failure as consequence of obstructive sleep apnea. Contributing factors listed in the completed form included hypertension, type 2 DM, schizophrenia.

## 2022-08-01 ENCOUNTER — Ambulatory Visit (HOSPITAL_BASED_OUTPATIENT_CLINIC_OR_DEPARTMENT_OTHER): Payer: No Typology Code available for payment source

## 2022-08-07 DEATH — deceased

## 2022-08-28 ENCOUNTER — Ambulatory Visit (HOSPITAL_BASED_OUTPATIENT_CLINIC_OR_DEPARTMENT_OTHER): Payer: No Typology Code available for payment source | Admitting: Internal Medicine
